# Patient Record
Sex: Female | Born: 1935
Health system: Southern US, Community
[De-identification: ages and names within clinical notes are randomized; demographics above are authoritative.]

## PROBLEM LIST (undated history)

## (undated) DIAGNOSIS — I10 Essential (primary) hypertension: Secondary | ICD-10-CM

## (undated) HISTORY — PX: ABDOMINAL HYSTERECTOMY: SHX81

## (undated) HISTORY — PX: DILATION AND CURETTAGE OF UTERUS: SHX78

## (undated) HISTORY — PX: HIP FRACTURE SURGERY: SHX118

---

## 2006-12-24 ENCOUNTER — Ambulatory Visit (HOSPITAL_COMMUNITY): Admission: RE | Admit: 2006-12-24 | Discharge: 2006-12-24 | Payer: Self-pay | Admitting: Ophthalmology

## 2013-03-26 ENCOUNTER — Encounter: Payer: Self-pay | Admitting: *Deleted

## 2013-10-28 ENCOUNTER — Encounter (HOSPITAL_COMMUNITY): Payer: Self-pay | Admitting: Pharmacy Technician

## 2013-11-10 ENCOUNTER — Ambulatory Visit (HOSPITAL_COMMUNITY)
Admission: RE | Admit: 2013-11-10 | Discharge: 2013-11-10 | Disposition: A | Discharge status: Home / Self Care | Payer: Medicare HMO | Source: Ambulatory Visit | Attending: Ophthalmology | Admitting: Ophthalmology

## 2013-11-10 ENCOUNTER — Encounter (HOSPITAL_COMMUNITY): Payer: Self-pay | Admitting: *Deleted

## 2013-11-10 ENCOUNTER — Encounter (HOSPITAL_COMMUNITY)
Admission: RE | Disposition: A | Discharge status: Home / Self Care | Payer: Self-pay | Source: Ambulatory Visit | Attending: Ophthalmology

## 2013-11-10 DIAGNOSIS — Z79899 Other long term (current) drug therapy: Secondary | ICD-10-CM | POA: Insufficient documentation

## 2013-11-10 DIAGNOSIS — I10 Essential (primary) hypertension: Secondary | ICD-10-CM | POA: Diagnosis not present

## 2013-11-10 DIAGNOSIS — H26419 Soemmering's ring, unspecified eye: Secondary | ICD-10-CM | POA: Insufficient documentation

## 2013-11-10 DIAGNOSIS — Z7982 Long term (current) use of aspirin: Secondary | ICD-10-CM | POA: Diagnosis not present

## 2013-11-10 HISTORY — PX: YAG LASER APPLICATION: SHX6189

## 2013-11-10 SURGERY — TREATMENT, USING YAG LASER
Anesthesia: LOCAL | Laterality: Left

## 2013-11-10 MED ORDER — TETRACAINE HCL 0.5 % OP SOLN
1.0000 [drp] | Freq: Once | OPHTHALMIC | Status: AC
  Administered 2013-11-10: 1 [drp] via OPHTHALMIC

## 2013-11-10 MED ORDER — TROPICAMIDE 1 % OP SOLN
1.0000 [drp] | OPHTHALMIC | Status: AC
  Administered 2013-11-10 (×3): 1 [drp] via OPHTHALMIC

## 2013-11-10 MED ORDER — APRACLONIDINE HCL 1 % OP SOLN
OPHTHALMIC | Status: AC
  Filled 2013-11-10: qty 0.1

## 2013-11-10 MED ORDER — APRACLONIDINE HCL 1 % OP SOLN
1.0000 [drp] | OPHTHALMIC | Status: AC
  Administered 2013-11-10 (×3): 1 [drp] via OPHTHALMIC

## 2013-11-10 MED ORDER — TETRACAINE HCL 0.5 % OP SOLN
OPHTHALMIC | Status: AC
  Filled 2013-11-10: qty 2

## 2013-11-10 MED ORDER — TROPICAMIDE 1 % OP SOLN
OPHTHALMIC | Status: AC
  Filled 2013-11-10: qty 3

## 2013-11-10 NOTE — Brief Op Note (Signed)
Laura Cook 11/10/2013  Williams Che, MD  Yag Laser Self Test CompletedYes.  . Procedure: Posterior Capsulotomy, left eye.  Eye Protection Worn by Staff Yes.  . Laser In Use Sign on Door Yes.  .  Laser: Nd:YAG Spot Size: Fixed Burst Mode: III Power Setting: 2.0 mJ/burst Position treated: posterior capsule Number of shots: 30 Total energy delivered: 60.2 mJ  The patient tolerated the procedure without difficulty. No complications were encountered.   The patient was discharged home immediately after procedure:  Patient verbalizes understanding of discharge instructions Yes.  .   Notes:heavy Elschnig's pearls noted

## 2013-11-10 NOTE — Discharge Instructions (Signed)
Laura Cook  11/10/2013     Instructions    Activity: No Restrictions.   Diet: Resume Diet you were on at home.   Pain Medication: Tylenol if Needed.   CONTACT YOUR DOCTOR IF YOU HAVE PAIN, REDNESS IN YOUR EYE, OR DECREASED VISION.   Follow-up: 12/02/2013 at 11:15 with Williams Che, MD.     Dr. Iona Hansen: [phone]   If you find that you cannot contact your physician, but feel that your signs and   Symptoms warrant a physician's attention, call the Emergency Room at   [phone] ext.[phone].

## 2013-11-10 NOTE — H&P (Signed)
See scanned office notes

## 2013-11-12 ENCOUNTER — Encounter (HOSPITAL_COMMUNITY): Payer: Self-pay | Admitting: Ophthalmology

## 2015-04-15 DIAGNOSIS — K219 Gastro-esophageal reflux disease without esophagitis: Secondary | ICD-10-CM | POA: Diagnosis not present

## 2015-04-15 DIAGNOSIS — G600 Hereditary motor and sensory neuropathy: Secondary | ICD-10-CM | POA: Diagnosis not present

## 2015-04-15 DIAGNOSIS — I10 Essential (primary) hypertension: Secondary | ICD-10-CM | POA: Diagnosis not present

## 2015-04-15 DIAGNOSIS — M1990 Unspecified osteoarthritis, unspecified site: Secondary | ICD-10-CM | POA: Diagnosis not present

## 2015-04-16 DIAGNOSIS — M1990 Unspecified osteoarthritis, unspecified site: Secondary | ICD-10-CM | POA: Diagnosis not present

## 2015-04-16 DIAGNOSIS — G600 Hereditary motor and sensory neuropathy: Secondary | ICD-10-CM | POA: Diagnosis not present

## 2015-04-16 DIAGNOSIS — R269 Unspecified abnormalities of gait and mobility: Secondary | ICD-10-CM | POA: Diagnosis not present

## 2015-06-03 DIAGNOSIS — W19XXXA Unspecified fall, initial encounter: Secondary | ICD-10-CM | POA: Diagnosis not present

## 2015-06-03 DIAGNOSIS — M25552 Pain in left hip: Secondary | ICD-10-CM | POA: Diagnosis not present

## 2015-06-15 DIAGNOSIS — S7002XA Contusion of left hip, initial encounter: Secondary | ICD-10-CM | POA: Diagnosis not present

## 2015-09-21 DIAGNOSIS — H538 Other visual disturbances: Secondary | ICD-10-CM | POA: Diagnosis not present

## 2015-09-21 DIAGNOSIS — Z961 Presence of intraocular lens: Secondary | ICD-10-CM | POA: Diagnosis not present

## 2015-09-21 DIAGNOSIS — H04123 Dry eye syndrome of bilateral lacrimal glands: Secondary | ICD-10-CM | POA: Diagnosis not present

## 2015-10-13 DIAGNOSIS — M1990 Unspecified osteoarthritis, unspecified site: Secondary | ICD-10-CM | POA: Diagnosis not present

## 2015-10-13 DIAGNOSIS — G600 Hereditary motor and sensory neuropathy: Secondary | ICD-10-CM | POA: Diagnosis not present

## 2015-10-13 DIAGNOSIS — I10 Essential (primary) hypertension: Secondary | ICD-10-CM | POA: Diagnosis not present

## 2015-10-13 DIAGNOSIS — K219 Gastro-esophageal reflux disease without esophagitis: Secondary | ICD-10-CM | POA: Diagnosis not present

## 2015-12-21 DIAGNOSIS — H04123 Dry eye syndrome of bilateral lacrimal glands: Secondary | ICD-10-CM | POA: Diagnosis not present

## 2016-01-24 DIAGNOSIS — J069 Acute upper respiratory infection, unspecified: Secondary | ICD-10-CM | POA: Diagnosis not present

## 2016-04-20 DIAGNOSIS — I10 Essential (primary) hypertension: Secondary | ICD-10-CM | POA: Diagnosis not present

## 2016-04-20 DIAGNOSIS — M47814 Spondylosis without myelopathy or radiculopathy, thoracic region: Secondary | ICD-10-CM | POA: Diagnosis not present

## 2016-04-20 DIAGNOSIS — Z6823 Body mass index (BMI) 23.0-23.9, adult: Secondary | ICD-10-CM | POA: Diagnosis not present

## 2016-04-20 DIAGNOSIS — K219 Gastro-esophageal reflux disease without esophagitis: Secondary | ICD-10-CM | POA: Diagnosis not present

## 2016-04-20 DIAGNOSIS — G600 Hereditary motor and sensory neuropathy: Secondary | ICD-10-CM | POA: Diagnosis not present

## 2016-10-27 DIAGNOSIS — I10 Essential (primary) hypertension: Secondary | ICD-10-CM | POA: Diagnosis not present

## 2016-10-27 DIAGNOSIS — Z6823 Body mass index (BMI) 23.0-23.9, adult: Secondary | ICD-10-CM | POA: Diagnosis not present

## 2016-10-27 DIAGNOSIS — G600 Hereditary motor and sensory neuropathy: Secondary | ICD-10-CM | POA: Diagnosis not present

## 2016-10-27 DIAGNOSIS — M47814 Spondylosis without myelopathy or radiculopathy, thoracic region: Secondary | ICD-10-CM | POA: Diagnosis not present

## 2016-10-27 DIAGNOSIS — K219 Gastro-esophageal reflux disease without esophagitis: Secondary | ICD-10-CM | POA: Diagnosis not present

## 2017-02-28 DIAGNOSIS — H524 Presbyopia: Secondary | ICD-10-CM | POA: Diagnosis not present

## 2017-02-28 DIAGNOSIS — Z0100 Encounter for examination of eyes and vision without abnormal findings: Secondary | ICD-10-CM | POA: Diagnosis not present

## 2017-04-26 DIAGNOSIS — B351 Tinea unguium: Secondary | ICD-10-CM | POA: Diagnosis not present

## 2017-04-26 DIAGNOSIS — Z6823 Body mass index (BMI) 23.0-23.9, adult: Secondary | ICD-10-CM | POA: Diagnosis not present

## 2017-04-26 DIAGNOSIS — M47814 Spondylosis without myelopathy or radiculopathy, thoracic region: Secondary | ICD-10-CM | POA: Diagnosis not present

## 2017-04-26 DIAGNOSIS — G600 Hereditary motor and sensory neuropathy: Secondary | ICD-10-CM | POA: Diagnosis not present

## 2017-04-26 DIAGNOSIS — K219 Gastro-esophageal reflux disease without esophagitis: Secondary | ICD-10-CM | POA: Diagnosis not present

## 2017-04-26 DIAGNOSIS — I10 Essential (primary) hypertension: Secondary | ICD-10-CM | POA: Diagnosis not present

## 2017-07-10 DIAGNOSIS — M549 Dorsalgia, unspecified: Secondary | ICD-10-CM | POA: Diagnosis not present

## 2017-07-10 DIAGNOSIS — M79606 Pain in leg, unspecified: Secondary | ICD-10-CM | POA: Diagnosis not present

## 2017-07-10 DIAGNOSIS — R600 Localized edema: Secondary | ICD-10-CM | POA: Diagnosis not present

## 2017-07-10 DIAGNOSIS — K219 Gastro-esophageal reflux disease without esophagitis: Secondary | ICD-10-CM | POA: Diagnosis not present

## 2017-07-10 DIAGNOSIS — I10 Essential (primary) hypertension: Secondary | ICD-10-CM | POA: Diagnosis not present

## 2017-07-10 DIAGNOSIS — R269 Unspecified abnormalities of gait and mobility: Secondary | ICD-10-CM | POA: Diagnosis not present

## 2017-07-10 DIAGNOSIS — G8929 Other chronic pain: Secondary | ICD-10-CM | POA: Diagnosis not present

## 2017-07-10 DIAGNOSIS — K5900 Constipation, unspecified: Secondary | ICD-10-CM | POA: Diagnosis not present

## 2017-07-10 DIAGNOSIS — K08109 Complete loss of teeth, unspecified cause, unspecified class: Secondary | ICD-10-CM | POA: Diagnosis not present

## 2017-07-10 DIAGNOSIS — M792 Neuralgia and neuritis, unspecified: Secondary | ICD-10-CM | POA: Diagnosis not present

## 2017-07-25 DIAGNOSIS — G600 Hereditary motor and sensory neuropathy: Secondary | ICD-10-CM | POA: Diagnosis not present

## 2017-07-25 DIAGNOSIS — I10 Essential (primary) hypertension: Secondary | ICD-10-CM | POA: Diagnosis not present

## 2017-07-25 DIAGNOSIS — Z6823 Body mass index (BMI) 23.0-23.9, adult: Secondary | ICD-10-CM | POA: Diagnosis not present

## 2017-07-25 DIAGNOSIS — K219 Gastro-esophageal reflux disease without esophagitis: Secondary | ICD-10-CM | POA: Diagnosis not present

## 2017-07-25 DIAGNOSIS — M47814 Spondylosis without myelopathy or radiculopathy, thoracic region: Secondary | ICD-10-CM | POA: Diagnosis not present

## 2017-10-23 DIAGNOSIS — G600 Hereditary motor and sensory neuropathy: Secondary | ICD-10-CM | POA: Diagnosis not present

## 2017-10-23 DIAGNOSIS — Z6823 Body mass index (BMI) 23.0-23.9, adult: Secondary | ICD-10-CM | POA: Diagnosis not present

## 2017-10-23 DIAGNOSIS — M47814 Spondylosis without myelopathy or radiculopathy, thoracic region: Secondary | ICD-10-CM | POA: Diagnosis not present

## 2017-10-23 DIAGNOSIS — I10 Essential (primary) hypertension: Secondary | ICD-10-CM | POA: Diagnosis not present

## 2017-10-23 DIAGNOSIS — K219 Gastro-esophageal reflux disease without esophagitis: Secondary | ICD-10-CM | POA: Diagnosis not present

## 2018-01-22 DIAGNOSIS — K219 Gastro-esophageal reflux disease without esophagitis: Secondary | ICD-10-CM | POA: Diagnosis not present

## 2018-01-22 DIAGNOSIS — I10 Essential (primary) hypertension: Secondary | ICD-10-CM | POA: Diagnosis not present

## 2018-01-22 DIAGNOSIS — G600 Hereditary motor and sensory neuropathy: Secondary | ICD-10-CM | POA: Diagnosis not present

## 2018-04-12 DIAGNOSIS — Z961 Presence of intraocular lens: Secondary | ICD-10-CM | POA: Diagnosis not present

## 2018-04-12 DIAGNOSIS — H524 Presbyopia: Secondary | ICD-10-CM | POA: Diagnosis not present

## 2018-04-23 DIAGNOSIS — B351 Tinea unguium: Secondary | ICD-10-CM | POA: Diagnosis not present

## 2018-04-23 DIAGNOSIS — G600 Hereditary motor and sensory neuropathy: Secondary | ICD-10-CM | POA: Diagnosis not present

## 2018-04-23 DIAGNOSIS — K219 Gastro-esophageal reflux disease without esophagitis: Secondary | ICD-10-CM | POA: Diagnosis not present

## 2018-04-23 DIAGNOSIS — M47814 Spondylosis without myelopathy or radiculopathy, thoracic region: Secondary | ICD-10-CM | POA: Diagnosis not present

## 2018-04-23 DIAGNOSIS — I10 Essential (primary) hypertension: Secondary | ICD-10-CM | POA: Diagnosis not present

## 2018-07-24 DIAGNOSIS — K219 Gastro-esophageal reflux disease without esophagitis: Secondary | ICD-10-CM | POA: Diagnosis not present

## 2018-07-24 DIAGNOSIS — G600 Hereditary motor and sensory neuropathy: Secondary | ICD-10-CM | POA: Diagnosis not present

## 2018-07-24 DIAGNOSIS — M47814 Spondylosis without myelopathy or radiculopathy, thoracic region: Secondary | ICD-10-CM | POA: Diagnosis not present

## 2018-07-24 DIAGNOSIS — I10 Essential (primary) hypertension: Secondary | ICD-10-CM | POA: Diagnosis not present

## 2018-09-09 DIAGNOSIS — Z6824 Body mass index (BMI) 24.0-24.9, adult: Secondary | ICD-10-CM | POA: Diagnosis not present

## 2018-09-09 DIAGNOSIS — R42 Dizziness and giddiness: Secondary | ICD-10-CM | POA: Diagnosis not present

## 2018-09-09 DIAGNOSIS — Z299 Encounter for prophylactic measures, unspecified: Secondary | ICD-10-CM | POA: Diagnosis not present

## 2018-09-09 DIAGNOSIS — I10 Essential (primary) hypertension: Secondary | ICD-10-CM | POA: Diagnosis not present

## 2018-09-09 DIAGNOSIS — Z789 Other specified health status: Secondary | ICD-10-CM | POA: Diagnosis not present

## 2018-11-22 DIAGNOSIS — Z79899 Other long term (current) drug therapy: Secondary | ICD-10-CM | POA: Diagnosis not present

## 2018-11-22 DIAGNOSIS — Z299 Encounter for prophylactic measures, unspecified: Secondary | ICD-10-CM | POA: Diagnosis not present

## 2018-11-22 DIAGNOSIS — Z0000 Encounter for general adult medical examination without abnormal findings: Secondary | ICD-10-CM | POA: Diagnosis not present

## 2018-11-22 DIAGNOSIS — R5383 Other fatigue: Secondary | ICD-10-CM | POA: Diagnosis not present

## 2018-11-22 DIAGNOSIS — I10 Essential (primary) hypertension: Secondary | ICD-10-CM | POA: Diagnosis not present

## 2018-11-22 DIAGNOSIS — Z1339 Encounter for screening examination for other mental health and behavioral disorders: Secondary | ICD-10-CM | POA: Diagnosis not present

## 2018-11-22 DIAGNOSIS — Z7189 Other specified counseling: Secondary | ICD-10-CM | POA: Diagnosis not present

## 2018-11-22 DIAGNOSIS — Z1331 Encounter for screening for depression: Secondary | ICD-10-CM | POA: Diagnosis not present

## 2018-11-22 DIAGNOSIS — Z6823 Body mass index (BMI) 23.0-23.9, adult: Secondary | ICD-10-CM | POA: Diagnosis not present

## 2018-12-09 DIAGNOSIS — Z299 Encounter for prophylactic measures, unspecified: Secondary | ICD-10-CM | POA: Diagnosis not present

## 2018-12-09 DIAGNOSIS — Z713 Dietary counseling and surveillance: Secondary | ICD-10-CM | POA: Diagnosis not present

## 2018-12-09 DIAGNOSIS — M25552 Pain in left hip: Secondary | ICD-10-CM | POA: Diagnosis not present

## 2018-12-09 DIAGNOSIS — Z6824 Body mass index (BMI) 24.0-24.9, adult: Secondary | ICD-10-CM | POA: Diagnosis not present

## 2018-12-09 DIAGNOSIS — I10 Essential (primary) hypertension: Secondary | ICD-10-CM | POA: Diagnosis not present

## 2018-12-11 DIAGNOSIS — E871 Hypo-osmolality and hyponatremia: Secondary | ICD-10-CM | POA: Diagnosis not present

## 2018-12-16 DIAGNOSIS — Z6823 Body mass index (BMI) 23.0-23.9, adult: Secondary | ICD-10-CM | POA: Diagnosis not present

## 2018-12-16 DIAGNOSIS — Z713 Dietary counseling and surveillance: Secondary | ICD-10-CM | POA: Diagnosis not present

## 2018-12-16 DIAGNOSIS — Z299 Encounter for prophylactic measures, unspecified: Secondary | ICD-10-CM | POA: Diagnosis not present

## 2018-12-16 DIAGNOSIS — M25552 Pain in left hip: Secondary | ICD-10-CM | POA: Diagnosis not present

## 2018-12-25 DIAGNOSIS — Z2821 Immunization not carried out because of patient refusal: Secondary | ICD-10-CM | POA: Diagnosis not present

## 2018-12-25 DIAGNOSIS — I10 Essential (primary) hypertension: Secondary | ICD-10-CM | POA: Diagnosis not present

## 2018-12-25 DIAGNOSIS — Z299 Encounter for prophylactic measures, unspecified: Secondary | ICD-10-CM | POA: Diagnosis not present

## 2018-12-25 DIAGNOSIS — M25552 Pain in left hip: Secondary | ICD-10-CM | POA: Diagnosis not present

## 2018-12-25 DIAGNOSIS — Z6823 Body mass index (BMI) 23.0-23.9, adult: Secondary | ICD-10-CM | POA: Diagnosis not present

## 2019-02-25 DIAGNOSIS — Z299 Encounter for prophylactic measures, unspecified: Secondary | ICD-10-CM | POA: Diagnosis not present

## 2019-02-25 DIAGNOSIS — I10 Essential (primary) hypertension: Secondary | ICD-10-CM | POA: Diagnosis not present

## 2019-02-25 DIAGNOSIS — Z6823 Body mass index (BMI) 23.0-23.9, adult: Secondary | ICD-10-CM | POA: Diagnosis not present

## 2019-02-25 DIAGNOSIS — Z713 Dietary counseling and surveillance: Secondary | ICD-10-CM | POA: Diagnosis not present

## 2019-02-25 DIAGNOSIS — E7800 Pure hypercholesterolemia, unspecified: Secondary | ICD-10-CM | POA: Diagnosis not present

## 2019-02-27 DIAGNOSIS — Z79899 Other long term (current) drug therapy: Secondary | ICD-10-CM | POA: Diagnosis not present

## 2019-02-27 DIAGNOSIS — E7800 Pure hypercholesterolemia, unspecified: Secondary | ICD-10-CM | POA: Diagnosis not present

## 2019-04-28 DIAGNOSIS — M79675 Pain in left toe(s): Secondary | ICD-10-CM | POA: Diagnosis not present

## 2019-04-28 DIAGNOSIS — L03032 Cellulitis of left toe: Secondary | ICD-10-CM | POA: Diagnosis not present

## 2019-04-28 DIAGNOSIS — L600 Ingrowing nail: Secondary | ICD-10-CM | POA: Diagnosis not present

## 2019-04-28 DIAGNOSIS — M79672 Pain in left foot: Secondary | ICD-10-CM | POA: Diagnosis not present

## 2019-05-12 DIAGNOSIS — M79675 Pain in left toe(s): Secondary | ICD-10-CM | POA: Diagnosis not present

## 2019-05-12 DIAGNOSIS — M79671 Pain in right foot: Secondary | ICD-10-CM | POA: Diagnosis not present

## 2019-05-12 DIAGNOSIS — M79672 Pain in left foot: Secondary | ICD-10-CM | POA: Diagnosis not present

## 2019-05-12 DIAGNOSIS — L600 Ingrowing nail: Secondary | ICD-10-CM | POA: Diagnosis not present

## 2019-05-12 DIAGNOSIS — M79674 Pain in right toe(s): Secondary | ICD-10-CM | POA: Diagnosis not present

## 2019-05-26 DIAGNOSIS — M79674 Pain in right toe(s): Secondary | ICD-10-CM | POA: Diagnosis not present

## 2019-05-26 DIAGNOSIS — M79675 Pain in left toe(s): Secondary | ICD-10-CM | POA: Diagnosis not present

## 2019-05-26 DIAGNOSIS — M79671 Pain in right foot: Secondary | ICD-10-CM | POA: Diagnosis not present

## 2019-05-26 DIAGNOSIS — I739 Peripheral vascular disease, unspecified: Secondary | ICD-10-CM | POA: Diagnosis not present

## 2019-05-26 DIAGNOSIS — L600 Ingrowing nail: Secondary | ICD-10-CM | POA: Diagnosis not present

## 2019-05-26 DIAGNOSIS — M79672 Pain in left foot: Secondary | ICD-10-CM | POA: Diagnosis not present

## 2019-05-26 DIAGNOSIS — L110 Acquired keratosis follicularis: Secondary | ICD-10-CM | POA: Diagnosis not present

## 2019-05-27 DIAGNOSIS — I10 Essential (primary) hypertension: Secondary | ICD-10-CM | POA: Diagnosis not present

## 2019-05-27 DIAGNOSIS — Z299 Encounter for prophylactic measures, unspecified: Secondary | ICD-10-CM | POA: Diagnosis not present

## 2019-05-27 DIAGNOSIS — Z789 Other specified health status: Secondary | ICD-10-CM | POA: Diagnosis not present

## 2019-05-27 DIAGNOSIS — E7800 Pure hypercholesterolemia, unspecified: Secondary | ICD-10-CM | POA: Diagnosis not present

## 2019-08-05 DIAGNOSIS — Z961 Presence of intraocular lens: Secondary | ICD-10-CM | POA: Diagnosis not present

## 2019-08-05 DIAGNOSIS — H524 Presbyopia: Secondary | ICD-10-CM | POA: Diagnosis not present

## 2019-08-05 DIAGNOSIS — Z0100 Encounter for examination of eyes and vision without abnormal findings: Secondary | ICD-10-CM | POA: Diagnosis not present

## 2019-08-05 DIAGNOSIS — H353131 Nonexudative age-related macular degeneration, bilateral, early dry stage: Secondary | ICD-10-CM | POA: Diagnosis not present

## 2019-08-11 DIAGNOSIS — Z008 Encounter for other general examination: Secondary | ICD-10-CM | POA: Diagnosis not present

## 2019-08-11 DIAGNOSIS — I739 Peripheral vascular disease, unspecified: Secondary | ICD-10-CM | POA: Diagnosis not present

## 2019-08-11 DIAGNOSIS — M79672 Pain in left foot: Secondary | ICD-10-CM | POA: Diagnosis not present

## 2019-08-11 DIAGNOSIS — E785 Hyperlipidemia, unspecified: Secondary | ICD-10-CM | POA: Diagnosis not present

## 2019-08-11 DIAGNOSIS — M792 Neuralgia and neuritis, unspecified: Secondary | ICD-10-CM | POA: Diagnosis not present

## 2019-08-11 DIAGNOSIS — M79671 Pain in right foot: Secondary | ICD-10-CM | POA: Diagnosis not present

## 2019-08-11 DIAGNOSIS — G8929 Other chronic pain: Secondary | ICD-10-CM | POA: Diagnosis not present

## 2019-08-11 DIAGNOSIS — K5900 Constipation, unspecified: Secondary | ICD-10-CM | POA: Diagnosis not present

## 2019-08-11 DIAGNOSIS — K219 Gastro-esophageal reflux disease without esophagitis: Secondary | ICD-10-CM | POA: Diagnosis not present

## 2019-08-11 DIAGNOSIS — I10 Essential (primary) hypertension: Secondary | ICD-10-CM | POA: Diagnosis not present

## 2019-08-11 DIAGNOSIS — R32 Unspecified urinary incontinence: Secondary | ICD-10-CM | POA: Diagnosis not present

## 2019-08-11 DIAGNOSIS — L110 Acquired keratosis follicularis: Secondary | ICD-10-CM | POA: Diagnosis not present

## 2019-08-11 DIAGNOSIS — R269 Unspecified abnormalities of gait and mobility: Secondary | ICD-10-CM | POA: Diagnosis not present

## 2019-08-11 DIAGNOSIS — M810 Age-related osteoporosis without current pathological fracture: Secondary | ICD-10-CM | POA: Diagnosis not present

## 2019-09-02 DIAGNOSIS — Z789 Other specified health status: Secondary | ICD-10-CM | POA: Diagnosis not present

## 2019-09-02 DIAGNOSIS — Z299 Encounter for prophylactic measures, unspecified: Secondary | ICD-10-CM | POA: Diagnosis not present

## 2019-09-02 DIAGNOSIS — R42 Dizziness and giddiness: Secondary | ICD-10-CM | POA: Diagnosis not present

## 2019-09-02 DIAGNOSIS — I10 Essential (primary) hypertension: Secondary | ICD-10-CM | POA: Diagnosis not present

## 2019-09-29 DIAGNOSIS — H353133 Nonexudative age-related macular degeneration, bilateral, advanced atrophic without subfoveal involvement: Secondary | ICD-10-CM | POA: Diagnosis not present

## 2019-10-21 DIAGNOSIS — M79674 Pain in right toe(s): Secondary | ICD-10-CM | POA: Diagnosis not present

## 2019-10-21 DIAGNOSIS — M79675 Pain in left toe(s): Secondary | ICD-10-CM | POA: Diagnosis not present

## 2019-10-21 DIAGNOSIS — I739 Peripheral vascular disease, unspecified: Secondary | ICD-10-CM | POA: Diagnosis not present

## 2019-10-21 DIAGNOSIS — M79671 Pain in right foot: Secondary | ICD-10-CM | POA: Diagnosis not present

## 2019-10-21 DIAGNOSIS — M79672 Pain in left foot: Secondary | ICD-10-CM | POA: Diagnosis not present

## 2019-10-21 DIAGNOSIS — L110 Acquired keratosis follicularis: Secondary | ICD-10-CM | POA: Diagnosis not present

## 2019-11-24 DIAGNOSIS — Z79899 Other long term (current) drug therapy: Secondary | ICD-10-CM | POA: Diagnosis not present

## 2019-11-24 DIAGNOSIS — E2839 Other primary ovarian failure: Secondary | ICD-10-CM | POA: Diagnosis not present

## 2019-11-24 DIAGNOSIS — Z7189 Other specified counseling: Secondary | ICD-10-CM | POA: Diagnosis not present

## 2019-11-24 DIAGNOSIS — Z0000 Encounter for general adult medical examination without abnormal findings: Secondary | ICD-10-CM | POA: Diagnosis not present

## 2019-11-24 DIAGNOSIS — Z6821 Body mass index (BMI) 21.0-21.9, adult: Secondary | ICD-10-CM | POA: Diagnosis not present

## 2019-11-24 DIAGNOSIS — I10 Essential (primary) hypertension: Secondary | ICD-10-CM | POA: Diagnosis not present

## 2019-11-24 DIAGNOSIS — Z299 Encounter for prophylactic measures, unspecified: Secondary | ICD-10-CM | POA: Diagnosis not present

## 2019-11-24 DIAGNOSIS — R5383 Other fatigue: Secondary | ICD-10-CM | POA: Diagnosis not present

## 2019-11-24 DIAGNOSIS — E7800 Pure hypercholesterolemia, unspecified: Secondary | ICD-10-CM | POA: Diagnosis not present

## 2019-11-24 DIAGNOSIS — Z1339 Encounter for screening examination for other mental health and behavioral disorders: Secondary | ICD-10-CM | POA: Diagnosis not present

## 2019-11-24 DIAGNOSIS — Z1331 Encounter for screening for depression: Secondary | ICD-10-CM | POA: Diagnosis not present

## 2020-01-20 DIAGNOSIS — M79672 Pain in left foot: Secondary | ICD-10-CM | POA: Diagnosis not present

## 2020-01-20 DIAGNOSIS — L110 Acquired keratosis follicularis: Secondary | ICD-10-CM | POA: Diagnosis not present

## 2020-01-20 DIAGNOSIS — I739 Peripheral vascular disease, unspecified: Secondary | ICD-10-CM | POA: Diagnosis not present

## 2020-01-20 DIAGNOSIS — M79674 Pain in right toe(s): Secondary | ICD-10-CM | POA: Diagnosis not present

## 2020-01-20 DIAGNOSIS — M79675 Pain in left toe(s): Secondary | ICD-10-CM | POA: Diagnosis not present

## 2020-01-20 DIAGNOSIS — M79671 Pain in right foot: Secondary | ICD-10-CM | POA: Diagnosis not present

## 2020-03-02 DIAGNOSIS — M1990 Unspecified osteoarthritis, unspecified site: Secondary | ICD-10-CM | POA: Diagnosis not present

## 2020-03-02 DIAGNOSIS — Z6821 Body mass index (BMI) 21.0-21.9, adult: Secondary | ICD-10-CM | POA: Diagnosis not present

## 2020-03-02 DIAGNOSIS — R011 Cardiac murmur, unspecified: Secondary | ICD-10-CM | POA: Diagnosis not present

## 2020-03-02 DIAGNOSIS — I10 Essential (primary) hypertension: Secondary | ICD-10-CM | POA: Diagnosis not present

## 2020-03-02 DIAGNOSIS — Z789 Other specified health status: Secondary | ICD-10-CM | POA: Diagnosis not present

## 2020-03-02 DIAGNOSIS — Z299 Encounter for prophylactic measures, unspecified: Secondary | ICD-10-CM | POA: Diagnosis not present

## 2020-03-02 DIAGNOSIS — R42 Dizziness and giddiness: Secondary | ICD-10-CM | POA: Diagnosis not present

## 2020-03-27 DIAGNOSIS — K219 Gastro-esophageal reflux disease without esophagitis: Secondary | ICD-10-CM | POA: Diagnosis not present

## 2020-03-27 DIAGNOSIS — M2550 Pain in unspecified joint: Secondary | ICD-10-CM | POA: Diagnosis not present

## 2020-03-27 DIAGNOSIS — E785 Hyperlipidemia, unspecified: Secondary | ICD-10-CM | POA: Diagnosis not present

## 2020-03-27 DIAGNOSIS — R42 Dizziness and giddiness: Secondary | ICD-10-CM | POA: Diagnosis not present

## 2020-03-27 DIAGNOSIS — K5900 Constipation, unspecified: Secondary | ICD-10-CM | POA: Diagnosis not present

## 2020-03-27 DIAGNOSIS — Z791 Long term (current) use of non-steroidal anti-inflammatories (NSAID): Secondary | ICD-10-CM | POA: Diagnosis not present

## 2020-03-27 DIAGNOSIS — G8929 Other chronic pain: Secondary | ICD-10-CM | POA: Diagnosis not present

## 2020-03-27 DIAGNOSIS — Z7722 Contact with and (suspected) exposure to environmental tobacco smoke (acute) (chronic): Secondary | ICD-10-CM | POA: Diagnosis not present

## 2020-03-27 DIAGNOSIS — R32 Unspecified urinary incontinence: Secondary | ICD-10-CM | POA: Diagnosis not present

## 2020-03-27 DIAGNOSIS — I10 Essential (primary) hypertension: Secondary | ICD-10-CM | POA: Diagnosis not present

## 2020-04-06 DIAGNOSIS — I739 Peripheral vascular disease, unspecified: Secondary | ICD-10-CM | POA: Diagnosis not present

## 2020-04-06 DIAGNOSIS — M79672 Pain in left foot: Secondary | ICD-10-CM | POA: Diagnosis not present

## 2020-04-06 DIAGNOSIS — M79671 Pain in right foot: Secondary | ICD-10-CM | POA: Diagnosis not present

## 2020-04-06 DIAGNOSIS — L110 Acquired keratosis follicularis: Secondary | ICD-10-CM | POA: Diagnosis not present

## 2020-04-06 DIAGNOSIS — M79674 Pain in right toe(s): Secondary | ICD-10-CM | POA: Diagnosis not present

## 2020-04-06 DIAGNOSIS — M79675 Pain in left toe(s): Secondary | ICD-10-CM | POA: Diagnosis not present

## 2020-06-01 DIAGNOSIS — M7552 Bursitis of left shoulder: Secondary | ICD-10-CM | POA: Diagnosis not present

## 2020-06-01 DIAGNOSIS — Z6822 Body mass index (BMI) 22.0-22.9, adult: Secondary | ICD-10-CM | POA: Diagnosis not present

## 2020-06-01 DIAGNOSIS — Z299 Encounter for prophylactic measures, unspecified: Secondary | ICD-10-CM | POA: Diagnosis not present

## 2020-06-01 DIAGNOSIS — M1990 Unspecified osteoarthritis, unspecified site: Secondary | ICD-10-CM | POA: Diagnosis not present

## 2020-06-01 DIAGNOSIS — I10 Essential (primary) hypertension: Secondary | ICD-10-CM | POA: Diagnosis not present

## 2020-07-01 DIAGNOSIS — R42 Dizziness and giddiness: Secondary | ICD-10-CM | POA: Diagnosis not present

## 2020-07-01 DIAGNOSIS — Z299 Encounter for prophylactic measures, unspecified: Secondary | ICD-10-CM | POA: Diagnosis not present

## 2020-07-01 DIAGNOSIS — I10 Essential (primary) hypertension: Secondary | ICD-10-CM | POA: Diagnosis not present

## 2020-07-06 DIAGNOSIS — L110 Acquired keratosis follicularis: Secondary | ICD-10-CM | POA: Diagnosis not present

## 2020-07-06 DIAGNOSIS — M79671 Pain in right foot: Secondary | ICD-10-CM | POA: Diagnosis not present

## 2020-07-06 DIAGNOSIS — I739 Peripheral vascular disease, unspecified: Secondary | ICD-10-CM | POA: Diagnosis not present

## 2020-07-06 DIAGNOSIS — M79672 Pain in left foot: Secondary | ICD-10-CM | POA: Diagnosis not present

## 2020-07-06 DIAGNOSIS — M79675 Pain in left toe(s): Secondary | ICD-10-CM | POA: Diagnosis not present

## 2020-07-06 DIAGNOSIS — M79674 Pain in right toe(s): Secondary | ICD-10-CM | POA: Diagnosis not present

## 2020-07-12 DIAGNOSIS — E2839 Other primary ovarian failure: Secondary | ICD-10-CM | POA: Diagnosis not present

## 2020-08-12 DIAGNOSIS — H524 Presbyopia: Secondary | ICD-10-CM | POA: Diagnosis not present

## 2020-08-12 DIAGNOSIS — Z961 Presence of intraocular lens: Secondary | ICD-10-CM | POA: Diagnosis not present

## 2020-08-12 DIAGNOSIS — H353133 Nonexudative age-related macular degeneration, bilateral, advanced atrophic without subfoveal involvement: Secondary | ICD-10-CM | POA: Diagnosis not present

## 2020-09-28 DIAGNOSIS — I739 Peripheral vascular disease, unspecified: Secondary | ICD-10-CM | POA: Diagnosis not present

## 2020-09-28 DIAGNOSIS — M79674 Pain in right toe(s): Secondary | ICD-10-CM | POA: Diagnosis not present

## 2020-09-28 DIAGNOSIS — M79671 Pain in right foot: Secondary | ICD-10-CM | POA: Diagnosis not present

## 2020-09-28 DIAGNOSIS — M79675 Pain in left toe(s): Secondary | ICD-10-CM | POA: Diagnosis not present

## 2020-09-28 DIAGNOSIS — L110 Acquired keratosis follicularis: Secondary | ICD-10-CM | POA: Diagnosis not present

## 2020-09-28 DIAGNOSIS — M79672 Pain in left foot: Secondary | ICD-10-CM | POA: Diagnosis not present

## 2020-11-14 ENCOUNTER — Other Ambulatory Visit: Payer: Self-pay

## 2020-11-14 ENCOUNTER — Emergency Department (HOSPITAL_COMMUNITY): Payer: Medicare HMO

## 2020-11-14 ENCOUNTER — Inpatient Hospital Stay (HOSPITAL_COMMUNITY)
Admission: EM | Admit: 2020-11-14 | Discharge: 2020-11-19 | DRG: 480 | Disposition: A | Discharge status: Skilled Nursing Facility | Payer: Medicare HMO | Attending: Internal Medicine | Admitting: Internal Medicine

## 2020-11-14 ENCOUNTER — Encounter (HOSPITAL_COMMUNITY): Payer: Self-pay | Admitting: Family Medicine

## 2020-11-14 DIAGNOSIS — E871 Hypo-osmolality and hyponatremia: Secondary | ICD-10-CM | POA: Diagnosis not present

## 2020-11-14 DIAGNOSIS — Z20822 Contact with and (suspected) exposure to covid-19: Secondary | ICD-10-CM | POA: Diagnosis not present

## 2020-11-14 DIAGNOSIS — E785 Hyperlipidemia, unspecified: Secondary | ICD-10-CM

## 2020-11-14 DIAGNOSIS — E876 Hypokalemia: Secondary | ICD-10-CM | POA: Diagnosis present

## 2020-11-14 DIAGNOSIS — Y92000 Kitchen of unspecified non-institutional (private) residence as  the place of occurrence of the external cause: Secondary | ICD-10-CM | POA: Diagnosis not present

## 2020-11-14 DIAGNOSIS — W19XXXA Unspecified fall, initial encounter: Secondary | ICD-10-CM

## 2020-11-14 DIAGNOSIS — S72141A Displaced intertrochanteric fracture of right femur, initial encounter for closed fracture: Principal | ICD-10-CM | POA: Diagnosis present

## 2020-11-14 DIAGNOSIS — R2689 Other abnormalities of gait and mobility: Secondary | ICD-10-CM | POA: Diagnosis present

## 2020-11-14 DIAGNOSIS — Y92009 Unspecified place in unspecified non-institutional (private) residence as the place of occurrence of the external cause: Secondary | ICD-10-CM | POA: Diagnosis not present

## 2020-11-14 DIAGNOSIS — S72001A Fracture of unspecified part of neck of right femur, initial encounter for closed fracture: Secondary | ICD-10-CM | POA: Diagnosis not present

## 2020-11-14 DIAGNOSIS — G928 Other toxic encephalopathy: Secondary | ICD-10-CM | POA: Diagnosis not present

## 2020-11-14 DIAGNOSIS — K219 Gastro-esophageal reflux disease without esophagitis: Secondary | ICD-10-CM

## 2020-11-14 DIAGNOSIS — J398 Other specified diseases of upper respiratory tract: Secondary | ICD-10-CM | POA: Diagnosis not present

## 2020-11-14 DIAGNOSIS — Z791 Long term (current) use of non-steroidal anti-inflammatories (NSAID): Secondary | ICD-10-CM

## 2020-11-14 DIAGNOSIS — I878 Other specified disorders of veins: Secondary | ICD-10-CM | POA: Diagnosis not present

## 2020-11-14 DIAGNOSIS — T502X5A Adverse effect of carbonic-anhydrase inhibitors, benzothiadiazides and other diuretics, initial encounter: Secondary | ICD-10-CM | POA: Diagnosis present

## 2020-11-14 DIAGNOSIS — W1830XA Fall on same level, unspecified, initial encounter: Secondary | ICD-10-CM | POA: Diagnosis not present

## 2020-11-14 DIAGNOSIS — R918 Other nonspecific abnormal finding of lung field: Secondary | ICD-10-CM | POA: Diagnosis not present

## 2020-11-14 DIAGNOSIS — R52 Pain, unspecified: Secondary | ICD-10-CM | POA: Diagnosis not present

## 2020-11-14 DIAGNOSIS — Y92239 Unspecified place in hospital as the place of occurrence of the external cause: Secondary | ICD-10-CM | POA: Diagnosis not present

## 2020-11-14 DIAGNOSIS — M1611 Unilateral primary osteoarthritis, right hip: Secondary | ICD-10-CM | POA: Diagnosis present

## 2020-11-14 DIAGNOSIS — I10 Essential (primary) hypertension: Secondary | ICD-10-CM

## 2020-11-14 DIAGNOSIS — T40605A Adverse effect of unspecified narcotics, initial encounter: Secondary | ICD-10-CM | POA: Diagnosis not present

## 2020-11-14 DIAGNOSIS — Z79899 Other long term (current) drug therapy: Secondary | ICD-10-CM

## 2020-11-14 DIAGNOSIS — Z7982 Long term (current) use of aspirin: Secondary | ICD-10-CM | POA: Diagnosis not present

## 2020-11-14 DIAGNOSIS — D649 Anemia, unspecified: Secondary | ICD-10-CM | POA: Diagnosis not present

## 2020-11-14 DIAGNOSIS — D62 Acute posthemorrhagic anemia: Secondary | ICD-10-CM | POA: Diagnosis not present

## 2020-11-14 DIAGNOSIS — S2242XA Multiple fractures of ribs, left side, initial encounter for closed fracture: Secondary | ICD-10-CM | POA: Diagnosis present

## 2020-11-14 DIAGNOSIS — R0902 Hypoxemia: Secondary | ICD-10-CM | POA: Diagnosis not present

## 2020-11-14 DIAGNOSIS — Z9889 Other specified postprocedural states: Secondary | ICD-10-CM | POA: Diagnosis not present

## 2020-11-14 DIAGNOSIS — M47816 Spondylosis without myelopathy or radiculopathy, lumbar region: Secondary | ICD-10-CM | POA: Diagnosis not present

## 2020-11-14 DIAGNOSIS — M25551 Pain in right hip: Secondary | ICD-10-CM | POA: Diagnosis not present

## 2020-11-14 DIAGNOSIS — I517 Cardiomegaly: Secondary | ICD-10-CM | POA: Diagnosis not present

## 2020-11-14 DIAGNOSIS — E7800 Pure hypercholesterolemia, unspecified: Secondary | ICD-10-CM | POA: Diagnosis not present

## 2020-11-14 DIAGNOSIS — Z8249 Family history of ischemic heart disease and other diseases of the circulatory system: Secondary | ICD-10-CM

## 2020-11-14 DIAGNOSIS — S72009A Fracture of unspecified part of neck of unspecified femur, initial encounter for closed fracture: Secondary | ICD-10-CM | POA: Diagnosis present

## 2020-11-14 DIAGNOSIS — Z743 Need for continuous supervision: Secondary | ICD-10-CM | POA: Diagnosis not present

## 2020-11-14 HISTORY — DX: Essential (primary) hypertension: I10

## 2020-11-14 LAB — CBC WITH DIFFERENTIAL/PLATELET
Abs Immature Granulocytes: 0.03 10*3/uL (ref 0.00–0.07)
Basophils Absolute: 0.1 10*3/uL (ref 0.0–0.1)
Basophils Relative: 1 %
Eosinophils Absolute: 0.1 10*3/uL (ref 0.0–0.5)
Eosinophils Relative: 2 %
HCT: 33.5 % — ABNORMAL LOW (ref 36.0–46.0)
Hemoglobin: 11.3 g/dL — ABNORMAL LOW (ref 12.0–15.0)
Immature Granulocytes: 1 %
Lymphocytes Relative: 20 %
Lymphs Abs: 1.2 10*3/uL (ref 0.7–4.0)
MCH: 29.4 pg (ref 26.0–34.0)
MCHC: 33.7 g/dL (ref 30.0–36.0)
MCV: 87 fL (ref 80.0–100.0)
Monocytes Absolute: 0.5 10*3/uL (ref 0.1–1.0)
Monocytes Relative: 8 %
Neutro Abs: 4.1 10*3/uL (ref 1.7–7.7)
Neutrophils Relative %: 68 %
Platelets: 325 10*3/uL (ref 150–400)
RBC: 3.85 MIL/uL — ABNORMAL LOW (ref 3.87–5.11)
RDW: 13.3 % (ref 11.5–15.5)
WBC: 5.9 10*3/uL (ref 4.0–10.5)
nRBC: 0 % (ref 0.0–0.2)

## 2020-11-14 LAB — COMPREHENSIVE METABOLIC PANEL
ALT: 9 U/L (ref 0–44)
AST: 13 U/L — ABNORMAL LOW (ref 15–41)
Albumin: 4 g/dL (ref 3.5–5.0)
Alkaline Phosphatase: 51 U/L (ref 38–126)
Anion gap: 9 (ref 5–15)
BUN: 16 mg/dL (ref 8–23)
CO2: 27 mmol/L (ref 22–32)
Calcium: 9.1 mg/dL (ref 8.9–10.3)
Chloride: 91 mmol/L — ABNORMAL LOW (ref 98–111)
Creatinine, Ser: 0.51 mg/dL (ref 0.44–1.00)
GFR, Estimated: >60 mL/min (ref >60)
Glucose, Bld: 116 mg/dL — ABNORMAL HIGH (ref 70–99)
Potassium: 3.2 mmol/L — ABNORMAL LOW (ref 3.5–5.1)
Sodium: 127 mmol/L — ABNORMAL LOW (ref 135–145)
Total Bilirubin: 0.8 mg/dL (ref 0.3–1.2)
Total Protein: 6.4 g/dL — ABNORMAL LOW (ref 6.5–8.1)

## 2020-11-14 LAB — MAGNESIUM: Magnesium: 1.8 mg/dL (ref 1.7–2.4)

## 2020-11-14 LAB — RESP PANEL BY RT-PCR (FLU A&B, COVID) ARPGX2
Influenza A by PCR: NEGATIVE
Influenza B by PCR: NEGATIVE
SARS Coronavirus 2 by RT PCR: NEGATIVE

## 2020-11-14 LAB — GLUCOSE, CAPILLARY: Glucose-Capillary: 165 mg/dL — ABNORMAL HIGH (ref 70–99)

## 2020-11-14 MED ORDER — SODIUM CHLORIDE 0.9% FLUSH
3.0000 mL | Freq: Two times a day (BID) | INTRAVENOUS | Status: DC
  Administered 2020-11-15 – 2020-11-18 (×7): 3 mL via INTRAVENOUS

## 2020-11-14 MED ORDER — MECLIZINE HCL 12.5 MG PO TABS: 25.0000 mg | ORAL_TABLET | Freq: Three times a day (TID) | ORAL | Status: DC | PRN

## 2020-11-14 MED ORDER — BISACODYL 5 MG PO TBEC: 5.0000 mg | DELAYED_RELEASE_TABLET | Freq: Every day | ORAL | Status: DC | PRN

## 2020-11-14 MED ORDER — ACETAMINOPHEN 650 MG RE SUPP: 650.0000 mg | Freq: Four times a day (QID) | RECTAL | Status: DC | PRN

## 2020-11-14 MED ORDER — ACETAMINOPHEN 325 MG PO TABS
650.0000 mg | ORAL_TABLET | Freq: Four times a day (QID) | ORAL | Status: DC | PRN
  Administered 2020-11-16: 650 mg via ORAL
  Filled 2020-11-14: qty 2

## 2020-11-14 MED ORDER — POLYETHYLENE GLYCOL 3350 17 G PO PACK: 17.0000 g | PACK | Freq: Every day | ORAL | Status: DC | PRN

## 2020-11-14 MED ORDER — METHOCARBAMOL 500 MG PO TABS
500.0000 mg | ORAL_TABLET | Freq: Three times a day (TID) | ORAL | Status: DC
  Administered 2020-11-14 – 2020-11-19 (×12): 500 mg via ORAL
  Filled 2020-11-14 (×15): qty 1

## 2020-11-14 MED ORDER — AMLODIPINE BESYLATE 5 MG PO TABS
5.0000 mg | ORAL_TABLET | Freq: Every day | ORAL | Status: DC
  Administered 2020-11-14 – 2020-11-19 (×5): 5 mg via ORAL
  Filled 2020-11-14 (×6): qty 1

## 2020-11-14 MED ORDER — FENTANYL CITRATE PF 50 MCG/ML IJ SOSY
50.0000 ug | PREFILLED_SYRINGE | INTRAMUSCULAR | Status: DC | PRN
  Administered 2020-11-14: 50 ug via INTRAVENOUS
  Filled 2020-11-14 (×2): qty 1

## 2020-11-14 MED ORDER — POTASSIUM CHLORIDE 10 MEQ/100ML IV SOLN
10.0000 meq | INTRAVENOUS | Status: AC
  Administered 2020-11-14 (×4): 10 meq via INTRAVENOUS
  Filled 2020-11-14 (×4): qty 100

## 2020-11-14 MED ORDER — TRANEXAMIC ACID-NACL 1000-0.7 MG/100ML-% IV SOLN
1000.0000 mg | INTRAVENOUS | Status: AC
  Administered 2020-11-15: 1000 mg via INTRAVENOUS
  Filled 2020-11-14: qty 100

## 2020-11-14 MED ORDER — METOPROLOL TARTRATE 50 MG PO TABS
100.0000 mg | ORAL_TABLET | Freq: Every day | ORAL | Status: DC
  Administered 2020-11-14 – 2020-11-19 (×5): 100 mg via ORAL
  Filled 2020-11-14 (×6): qty 2

## 2020-11-14 MED ORDER — CYCLOSPORINE 0.05 % OP EMUL
1.0000 [drp] | Freq: Two times a day (BID) | OPHTHALMIC | Status: DC
  Administered 2020-11-14 – 2020-11-19 (×9): 1 [drp] via OPHTHALMIC
  Filled 2020-11-14 (×10): qty 30

## 2020-11-14 MED ORDER — LABETALOL HCL 5 MG/ML IV SOLN: 10.0000 mg | INTRAVENOUS | Status: DC | PRN

## 2020-11-14 MED ORDER — POTASSIUM CHLORIDE 10 MEQ/100ML IV SOLN: 10.0000 meq | INTRAVENOUS | Status: DC

## 2020-11-14 MED ORDER — ENOXAPARIN SODIUM 40 MG/0.4ML IJ SOSY
40.0000 mg | PREFILLED_SYRINGE | INTRAMUSCULAR | Status: DC
  Administered 2020-11-15 – 2020-11-18 (×4): 40 mg via SUBCUTANEOUS
  Filled 2020-11-14 (×4): qty 0.4

## 2020-11-14 MED ORDER — SODIUM CHLORIDE 0.9 % IV SOLN: INTRAVENOUS | Status: DC

## 2020-11-14 MED ORDER — FENTANYL CITRATE PF 50 MCG/ML IJ SOSY
25.0000 ug | PREFILLED_SYRINGE | INTRAMUSCULAR | Status: DC | PRN
  Administered 2020-11-14 (×3): 25 ug via INTRAVENOUS
  Filled 2020-11-14 (×2): qty 1

## 2020-11-14 MED ORDER — SODIUM CHLORIDE 0.9 % IV SOLN: 250.0000 mL | INTRAVENOUS | Status: DC | PRN

## 2020-11-14 MED ORDER — OXYCODONE HCL 5 MG PO TABS
5.0000 mg | ORAL_TABLET | ORAL | Status: DC | PRN
  Administered 2020-11-14 – 2020-11-16 (×4): 5 mg via ORAL
  Filled 2020-11-14 (×4): qty 1

## 2020-11-14 MED ORDER — CEFAZOLIN SODIUM-DEXTROSE 2-4 GM/100ML-% IV SOLN
2.0000 g | INTRAVENOUS | Status: AC
  Administered 2020-11-15: 2 g via INTRAVENOUS
  Filled 2020-11-14: qty 100

## 2020-11-14 MED ORDER — PANTOPRAZOLE SODIUM 40 MG PO TBEC
40.0000 mg | DELAYED_RELEASE_TABLET | Freq: Every day | ORAL | Status: DC
  Administered 2020-11-14 – 2020-11-19 (×5): 40 mg via ORAL
  Filled 2020-11-14 (×6): qty 1

## 2020-11-14 MED ORDER — TRAZODONE HCL 50 MG PO TABS
50.0000 mg | ORAL_TABLET | Freq: Every evening | ORAL | Status: DC | PRN
  Administered 2020-11-15 – 2020-11-16 (×2): 50 mg via ORAL
  Filled 2020-11-14 (×2): qty 1

## 2020-11-14 MED ORDER — SODIUM CHLORIDE 0.9% FLUSH: 3.0000 mL | INTRAVENOUS | Status: DC | PRN

## 2020-11-14 MED ORDER — ONDANSETRON HCL 4 MG/2ML IJ SOLN
4.0000 mg | Freq: Four times a day (QID) | INTRAMUSCULAR | Status: DC | PRN
  Administered 2020-11-14: 4 mg via INTRAVENOUS
  Filled 2020-11-14 (×2): qty 2

## 2020-11-14 MED ORDER — POTASSIUM CHLORIDE CRYS ER 20 MEQ PO TBCR
40.0000 meq | EXTENDED_RELEASE_TABLET | Freq: Once | ORAL | Status: AC
  Administered 2020-11-14: 40 meq via ORAL
  Filled 2020-11-14: qty 2

## 2020-11-14 MED ORDER — ONDANSETRON HCL 4 MG PO TABS: 4.0000 mg | ORAL_TABLET | Freq: Four times a day (QID) | ORAL | Status: DC | PRN

## 2020-11-14 MED ORDER — BISACODYL 10 MG RE SUPP: 10.0000 mg | Freq: Every day | RECTAL | Status: DC | PRN

## 2020-11-14 MED ORDER — SODIUM CHLORIDE 0.9% FLUSH
3.0000 mL | Freq: Two times a day (BID) | INTRAVENOUS | Status: DC
  Administered 2020-11-18: 3 mL via INTRAVENOUS

## 2020-11-14 MED ORDER — POLYETHYLENE GLYCOL 3350 17 G PO PACK
17.0000 g | PACK | Freq: Every day | ORAL | Status: DC
  Administered 2020-11-14 – 2020-11-19 (×4): 17 g via ORAL
  Filled 2020-11-14 (×6): qty 1

## 2020-11-14 NOTE — ED Triage Notes (Signed)
Pt states she did not lose consciousness, but does not know if she hit her head or not

## 2020-11-14 NOTE — H&P (Addendum)
Patient Demographics:    Laura Cook, is a 87 y.o. female  MRN: [id number]   DOB - 05-16-1922  Admit Date - 11/14/2020  Outpatient Primary MD for the patient is Monico Blitz, MD   Assessment & Plan:    Principal Problem:   Rt Hip fracture /S/p Fall Active Problems:   HTN (hypertension)   Hyponatremia--HCTZ induced   GERD (gastroesophageal reflux disease)   HLD (hyperlipidemia)  1)Rt Hip Fx--status post mechanical fall with right hip fracture -Orthopedic consult appreciated--plan is for ORIF on 11/15/2020 -Further management per orthopedic team -Pain control and muscle relaxants as ordered -Preop EKG with sinus rhythm with QTC 473  2)Hyponatremia/hypokalemia--- due to HCTZ use -Replace potassium, hydrate with IV fluids and stop HCTZ  3)HTN--stable, continue amlodipine, hold HCTZ due to #2 above  may use IV labetalol when necessary  Every 4 hours for systolic blood pressure over 170 mmhg  4)HLD--hold Crestor, given age of almost 79 years--doubt significant benefit down the road from statin therapy  5)GERD--Protonix as ordered  6) abnormal chest x-ray-----CXR with possible Lt sided rib fractures---may need CT chest to evaluate mild widening of the RIGHT paratracheal stripe  Disposition/Need for in-Hospital Stay- patient unable to be discharged at this time due to -right hip fracture requiring ORIF as well as hyponatremia-hypokalemia requiring correction and IV fluids*  Status is: Inpatient  Remains inpatient appropriate because: see Disposition  Dispo: The patient is from: Home              Anticipated d/c is to: SNF              Anticipated d/c date is: 2 days              Patient currently is not medically stable to d/c. Barriers: Not Clinically Stable-    With History of - Reviewed by  me  History reviewed. No pertinent past medical history.    Past Surgical History:  Procedure Laterality Date   YAG LASER APPLICATION Left 9/83/3825   Procedure: YAG LASER APPLICATION;  Surgeon: Williams Che, MD;  Location: [facility];  Service: Ophthalmology;  Laterality: Left;    CC--- Rt Hip pain---   HPI:    Laura Cook  is a 87 y.o. female (almost 87 yo) with Pmhx relevant for HTN, HLD, GERD who presents with Rt Hip Pain after falling at home in her kitchen ---She states she was bringing milk to the table while holding onto things, when she fell.  She was Not actively using her walker when she fell.  She has a chronic gait instability.  No fever  Or chills , No Nausea, Vomiting or Diarrhea -No chest pain, no HAs--, no visual disturbances,  - CXR with possible Lt sided rib fractures---may need CT chest to evaluate mild widening of the RIGHT paratracheal stripe - Mag 1.8 -Hgb 11.3,  WBC 5.9 -Na 127, K-3.2, chloride 91, creatinine 0.51 Hip Xrays ---shows Comminuted intertrochanteric  fracture of the RIGHT proximal femur.   Review of systems:    In addition to the HPI above,   A full Review of  Systems was done, all other systems reviewed are negative except as noted above in HPI , .   Social History:  Reviewed by me    Social History   Tobacco Use   Smoking status: Unknown   Smokeless tobacco: Not on file  Substance Use Topics   Alcohol use: Not on file     Family History :  Reviewed by me   HTN    Home Medications:   Prior to Admission medications   Medication Sig Start Date End Date Taking? Authorizing Provider  acetaminophen (TYLENOL) 325 MG tablet Take 650 mg by mouth every 6 (six) hours as needed.   Yes [provider]  amLODipine (NORVASC) 5 MG tablet Take 5 mg by mouth daily. 11/11/20  Yes [provider]  bisacodyl (DULCOLAX) 5 MG EC tablet Take 5 mg by mouth daily as needed for moderate constipation.   Yes [provider]   esomeprazole (NEXIUM) 20 MG capsule Take 20 mg by mouth daily at 12 noon.   Yes [provider]  hydrochlorothiazide (HYDRODIURIL) 50 MG tablet Take 50 mg by mouth daily.   Yes [provider]  meclizine (ANTIVERT) 25 MG tablet Take 25 mg by mouth every 8 (eight) hours as needed. 07/01/20  Yes [provider]  meloxicam (MOBIC) 15 MG tablet Take 15 mg by mouth daily. 09/07/20  Yes [provider]  metoprolol tartrate (LOPRESSOR) 100 MG tablet Take 100 mg by mouth daily. 10/25/20  Yes [provider]  OVER THE COUNTER MEDICATION Take 1 tablet by mouth daily. calcium   Yes [provider]  RESTASIS 0.05 % ophthalmic emulsion 1 drop 2 (two) times daily. 11/08/20  Yes [provider]  rosuvastatin (CRESTOR) 10 MG tablet Take 10 mg by mouth at bedtime. 10/23/20  Yes [provider]  aspirin EC 325 MG tablet Take 325 mg by mouth daily. Patient not taking: Reported on 11/14/2020    [provider]  traMADol (ULTRAM) 50 MG tablet Take 50 mg by mouth every 6 (six) hours as needed for moderate pain. Patient not taking: Reported on 11/14/2020    [provider]     Allergies:    No Known Allergies   Physical Exam:   Vitals  Blood pressure (!) 139/55, pulse 74, temperature 97.8 F (36.6 C), temperature source Oral, resp. rate 16, height 5\' 4"  (1.626 m), weight 52.2 kg, SpO2 100 %.  Physical Examination: General appearance - alert,  and in no distress  Mental status - alert, oriented to person, place, and time,  Eyes - sclera anicteric Neck - supple, no JVD elevation , Chest - clear  to auscultation bilaterally, symmetrical air movement,  Heart - S1 and S2 normal, regular  Abdomen - soft, nontender, nondistended, no masses or organomegaly Neurological - screening mental status exam normal, neck supple without rigidity, cranial nerves II through XII intact, DTR's normal and symmetric Extremities - no pedal edema  noted, intact peripheral pulses  Skin - warm, dry MSK-right lower extremity is shortened and rotated, point tenderness in the hip area     Data Review:    CBC Recent Labs  Lab 11/14/20 0942  WBC 5.9  HGB 11.3*  HCT 33.5*  PLT 325  MCV 87.0  MCH 29.4  MCHC 33.7  RDW 13.3  LYMPHSABS 1.2  MONOABS  0.5  EOSABS 0.1  BASOSABS 0.1   ------------------------------------------------------------------------------------------------------------------  Chemistries  Recent Labs  Lab 11/14/20 0942  NA 127*  K 3.2*  CL 91*  CO2 27  GLUCOSE 116*  BUN 16  CREATININE 0.51  CALCIUM 9.1  MG 1.8  AST 13*  ALT 9  ALKPHOS 51  BILITOT 0.8   ------------------------------------------------------------------------------------------------------------------ estimated creatinine clearance is 34.7 mL/min (by C-G formula based on SCr of 0.51 mg/dL). ------------------------------------------------------------------------------------------------------------------ No results for input(s): TSH, T4TOTAL, T3FREE, THYROIDAB in the last 72 hours.  Invalid input(s): FREET3   Coagulation profile No results for input(s): INR, PROTIME in the last 168 hours. ------------------------------------------------------------------------------------------------------------------- No results for input(s): DDIMER in the last 72 hours. -------------------------------------------------------------------------------------------------------------------  Cardiac Enzymes No results for input(s): CKMB, TROPONINI, MYOGLOBIN in the last 168 hours.  Invalid input(s): CK ------------------------------------------------------------------------------------------------------------------ No results found for: BNP   ---------------------------------------------------------------------------------------------------------------  Urinalysis No results found for: COLORURINE, APPEARANCEUR, LABSPEC, PHURINE, GLUCOSEU,  HGBUR, BILIRUBINUR, KETONESUR, PROTEINUR, UROBILINOGEN, NITRITE, LEUKOCYTESUR  ----------------------------------------------------------------------------------------------------------------   Imaging Results:    DG Chest 1 View  Result Date: 11/14/2020 CLINICAL DATA:  pain EXAM: CHEST  1 VIEW COMPARISON:  None. FINDINGS: The cardiomediastinal silhouette is enlarged in contour. Prominence of the RIGHT paratracheal stripeatherosclerotic calcifications. Biapical scarring. No pleural effusion. No pneumothorax. No acute pleuroparenchymal abnormality. Visualized abdomen is unremarkable. Age-indeterminate compression fracture of the midthoracic vertebral body; recommend correlation with point tenderness. Lucency and mild cortical irregularity of the LEFT lateral fifth and sixth rib. Remote LEFT posterior sixth and seventh rib fractures. IMPRESSION: 1. No focal consolidation. 2. Mild widening of the RIGHT paratracheal stripe, possibly summation from overlapping vascular structures. Consider further dedicated evaluation with PA and lateral radiograph versus nonemergent chest CT if clinically indicated. 3. Age-indeterminate compression fracture of a midthoracic vertebral body; recommend correlation with point tenderness 4. Subtle lucency traversing several LEFT-sided ribs may reflect nondisplaced rib fractures in the appropriate clinical setting. Correlate with point tenderness Electronically Signed   By: Valentino Saxon M.D.   On: 11/14/2020 10:25   DG Hip Unilat With Pelvis 2-3 Views Right  Result Date: 11/14/2020 CLINICAL DATA:  pain EXAM: DG HIP (WITH PELVIS) 3V RIGHT COMPARISON:  None. FINDINGS: Osteopenia. There is a comminuted intertrochanteric fracture of the RIGHT proximal femur. There is minimal displacement of the fracture fragments. Femoral head appears seated within the acetabulum. Severe vascular calcifications. Pelvic phleboliths. Degenerative changes of the lower lumbar spine. Status post ORIF  of the LEFT femur. Mild cortical thickening of the LEFT superior pubic ramus likely reflecting sequela of remote prior fracture. Limited assessment of the sacrum secondary to overlying bowel contents and osteopenia. IMPRESSION: Comminuted intertrochanteric fracture of the RIGHT proximal femur. Electronically Signed   By: Valentino Saxon M.D.   On: 11/14/2020 10:18    Radiological Exams on Admission: DG Chest 1 View  Result Date: 11/14/2020 CLINICAL DATA:  pain EXAM: CHEST  1 VIEW COMPARISON:  None. FINDINGS: The cardiomediastinal silhouette is enlarged in contour. Prominence of the RIGHT paratracheal stripeatherosclerotic calcifications. Biapical scarring. No pleural effusion. No pneumothorax. No acute pleuroparenchymal abnormality. Visualized abdomen is unremarkable. Age-indeterminate compression fracture of the midthoracic vertebral body; recommend correlation with point tenderness. Lucency and mild cortical irregularity of the LEFT lateral fifth and sixth rib. Remote LEFT posterior sixth and seventh rib fractures. IMPRESSION: 1. No focal consolidation. 2. Mild widening of the RIGHT paratracheal stripe, possibly summation from overlapping vascular structures. Consider further dedicated evaluation with PA and lateral radiograph versus nonemergent chest CT if clinically indicated. 3. Age-indeterminate  compression fracture of a midthoracic vertebral body; recommend correlation with point tenderness 4. Subtle lucency traversing several LEFT-sided ribs may reflect nondisplaced rib fractures in the appropriate clinical setting. Correlate with point tenderness Electronically Signed   By: Valentino Saxon M.D.   On: 11/14/2020 10:25   DG Hip Unilat With Pelvis 2-3 Views Right  Result Date: 11/14/2020 CLINICAL DATA:  pain EXAM: DG HIP (WITH PELVIS) 3V RIGHT COMPARISON:  None. FINDINGS: Osteopenia. There is a comminuted intertrochanteric fracture of the RIGHT proximal femur. There is minimal displacement of  the fracture fragments. Femoral head appears seated within the acetabulum. Severe vascular calcifications. Pelvic phleboliths. Degenerative changes of the lower lumbar spine. Status post ORIF of the LEFT femur. Mild cortical thickening of the LEFT superior pubic ramus likely reflecting sequela of remote prior fracture. Limited assessment of the sacrum secondary to overlying bowel contents and osteopenia. IMPRESSION: Comminuted intertrochanteric fracture of the RIGHT proximal femur. Electronically Signed   By: Valentino Saxon M.D.   On: 11/14/2020 10:18    DVT Prophylaxis -SCD  AM Labs Ordered, also please review Full Orders  Family Communication: Admission, patients condition and plan of care including tests being ordered have been discussed with the patient and family member at bedside who indicate understanding and agree with the plan   Code Status - Full Code  Likely DC to  SNF  Condition   stable  Roxan Hockey M.D on 11/14/2020 at 4:31 PM Go to [email or web address] -  for contact info  [facility] - Office  [phone]

## 2020-11-14 NOTE — ED Notes (Signed)
ED Provider at bedside. 

## 2020-11-14 NOTE — ED Triage Notes (Signed)
Pt brought by ems for fall in kitchen. Pain in R hip over femoral head

## 2020-11-14 NOTE — ED Provider Notes (Signed)
[facility] EMERGENCY DEPARTMENT Provider Note   CSN: [id number] Arrival date & time: 11/14/20  0915     History No chief complaint on file.   Laura Cook is a 87 y.o. female.  HPI She presents for evaluation, by EMS after a fall in her kitchen.  She states she was bringing milk to the table while holding onto things, when she fell.  She is not actively using her walker when she fell.  She has a chronic gait instability.  She complains of pain in her right upper leg only.  She does not think she hit her head or injured her neck when she fell.  During transport she was treated with fentanyl 25 mcg.  Patient denies recent fever, chills, nausea, vomiting, difficulties with bowels or urine.  She denies recent illnesses.  There are no other known active modifying factors.    No past medical history on file.  Patient Active Problem List   Diagnosis Date Noted   Hip fracture ([place]) 11/14/2020     Past Surgical History:  Procedure Laterality Date   YAG LASER APPLICATION Left 2/50/5397   Procedure: YAG LASER APPLICATION;  Surgeon: Williams Che, MD;  Location: [facility];  Service: Ophthalmology;  Laterality: Left;     OB History   No obstetric history on file.     No family history on file.  Social History   Tobacco Use   Smoking status: Unknown    Home Medications Prior to Admission medications   Medication Sig Start Date End Date Taking? Authorizing Provider  aspirin EC 325 MG tablet Take 325 mg by mouth daily.    [provider]  bisacodyl (DULCOLAX) 5 MG EC tablet Take 5 mg by mouth daily as needed for moderate constipation.    [provider]  esomeprazole (NEXIUM) 20 MG capsule Take 20 mg by mouth daily at 12 noon.    [provider]  hydrochlorothiazide (HYDRODIURIL) 50 MG tablet Take 50 mg by mouth daily.    [provider]  traMADol (ULTRAM) 50 MG tablet Take 50 mg by mouth every 6 (six) hours as needed for moderate pain.     [provider]    Allergies    Patient has no known allergies.  Review of Systems   Review of Systems  All other systems reviewed and are negative.  Physical Exam Updated Vital Signs BP (!) 146/63   Pulse 79   Temp 97.6 F (36.4 C) (Oral)   Resp (!) 21   Ht 5\' 4"  (1.626 m)   Wt 52.2 kg   SpO2 100%   BMI 19.74 kg/m   Physical Exam Vitals and nursing note reviewed.  Constitutional:      General: She is not in acute distress.    Appearance: She is well-developed. She is not ill-appearing, toxic-appearing or diaphoretic.     Comments: Elderly, frail  HENT:     Head: Normocephalic and atraumatic.     Mouth/Throat:     Mouth: Mucous membranes are moist.     Pharynx: No oropharyngeal exudate or posterior oropharyngeal erythema.  Eyes:     Conjunctiva/sclera: Conjunctivae normal.     Pupils: Pupils are equal, round, and reactive to light.  Neck:     Trachea: Phonation normal.  Cardiovascular:     Rate and Rhythm: Normal rate.  Pulmonary:     Effort: Pulmonary effort is normal. No respiratory distress.     Breath sounds: No stridor.  Chest:  Chest wall: No tenderness.  Abdominal:     General: There is no distension.     Palpations: Abdomen is soft.     Tenderness: There is no abdominal tenderness.  Musculoskeletal:     Cervical back: Normal range of motion and neck supple.     Comments: Normal range of motion, arms and left leg.  She cannot lift her right leg off the stretcher because of right upper leg and hip pain.  There is also increased discomfort with passive range of motion of the right hip.  There is no tenderness to the distal femur or knee on the right.  Skin:    General: Skin is warm and dry.  Neurological:     Mental Status: She is alert and oriented to person, place, and time.     Cranial Nerves: No cranial nerve deficit.     Motor: No weakness or abnormal muscle tone.     Coordination: Coordination normal.  Psychiatric:        Mood and  Affect: Mood normal.        Behavior: Behavior normal.    ED Results / Procedures / Treatments   Labs (all labs ordered are listed, but only abnormal results are displayed) Labs Reviewed  COMPREHENSIVE METABOLIC PANEL - Abnormal; Notable for the following components:      Result Value   Sodium 127 (*)    Potassium 3.2 (*)    Chloride 91 (*)    Glucose, Bld 116 (*)    Total Protein 6.4 (*)    AST 13 (*)    All other components within normal limits  CBC WITH DIFFERENTIAL/PLATELET - Abnormal; Notable for the following components:   RBC 3.85 (*)    Hemoglobin 11.3 (*)    HCT 33.5 (*)    All other components within normal limits  RESP PANEL BY RT-PCR (FLU A&B, COVID) [id number]  MAGNESIUM  TYPE AND SCREEN    EKG EKG Interpretation  Date/Time:  Sunday November 14 2020 09:24:44 EDT Ventricular Rate:  79 PR Interval:  247 QRS Duration: 93 QT Interval:  412 QTC Calculation: 473 R Axis:   -5 Text Interpretation: Sinus rhythm Prolonged PR interval Probable left atrial enlargement Nonspecific repol abnormality, lateral leads No old tracing to compare Confirmed by Daleen Bo [phone]) on 11/14/2020 10:53:33 AM  Radiology DG Chest 1 View  Result Date: 11/14/2020 CLINICAL DATA:  pain EXAM: CHEST  1 VIEW COMPARISON:  None. FINDINGS: The cardiomediastinal silhouette is enlarged in contour. Prominence of the RIGHT paratracheal stripeatherosclerotic calcifications. Biapical scarring. No pleural effusion. No pneumothorax. No acute pleuroparenchymal abnormality. Visualized abdomen is unremarkable. Age-indeterminate compression fracture of the midthoracic vertebral body; recommend correlation with point tenderness. Lucency and mild cortical irregularity of the LEFT lateral fifth and sixth rib. Remote LEFT posterior sixth and seventh rib fractures. IMPRESSION: 1. No focal consolidation. 2. Mild widening of the RIGHT paratracheal stripe, possibly summation from overlapping vascular structures.  Consider further dedicated evaluation with PA and lateral radiograph versus nonemergent chest CT if clinically indicated. 3. Age-indeterminate compression fracture of a midthoracic vertebral body; recommend correlation with point tenderness 4. Subtle lucency traversing several LEFT-sided ribs may reflect nondisplaced rib fractures in the appropriate clinical setting. Correlate with point tenderness Electronically Signed   By: Valentino Saxon M.D.   On: 11/14/2020 10:25   DG Hip Unilat With Pelvis 2-3 Views Right  Result Date: 11/14/2020 CLINICAL DATA:  pain EXAM: DG HIP (WITH PELVIS) 3V RIGHT COMPARISON:  None. FINDINGS: Osteopenia.  There is a comminuted intertrochanteric fracture of the RIGHT proximal femur. There is minimal displacement of the fracture fragments. Femoral head appears seated within the acetabulum. Severe vascular calcifications. Pelvic phleboliths. Degenerative changes of the lower lumbar spine. Status post ORIF of the LEFT femur. Mild cortical thickening of the LEFT superior pubic ramus likely reflecting sequela of remote prior fracture. Limited assessment of the sacrum secondary to overlying bowel contents and osteopenia. IMPRESSION: Comminuted intertrochanteric fracture of the RIGHT proximal femur. Electronically Signed   By: Valentino Saxon M.D.   On: 11/14/2020 10:18    Procedures .Critical Care Performed by: Daleen Bo, MD Authorized by: Daleen Bo, MD   Critical care provider statement:    Critical care time (minutes):  35   Critical care start time:  11/14/2020 9:40 AM   Critical care end time:  11/14/2020 11:22 AM   Critical care time was exclusive of:  Separately billable procedures and treating other patients   Critical care was necessary to treat or prevent imminent or life-threatening deterioration of the following conditions:  Trauma and metabolic crisis   Critical care was time spent personally by me on the following activities:  Blood draw for specimens,  development of treatment plan with patient or surrogate, discussions with consultants, evaluation of patient's response to treatment, examination of patient, obtaining history from patient or surrogate, ordering and performing treatments and interventions, ordering and review of laboratory studies, pulse oximetry, re-evaluation of patient's condition, review of old charts and ordering and review of radiographic studies   Medications Ordered in ED Medications  0.9 %  sodium chloride infusion ( Intravenous New Bag/Given 11/14/20 0957)  fentaNYL (SUBLIMAZE) injection 25 mcg (has no administration in time range)  methocarbamol (ROBAXIN) tablet 500 mg (has no administration in time range)  potassium chloride 10 mEq in 100 mL IVPB (has no administration in time range)    ED Course  I have reviewed the triage vital signs and the nursing notes.  Pertinent labs & imaging results that were available during my care of the patient were reviewed by me and considered in my medical decision making (see chart for details).  Clinical Course as of 11/14/20 1122  Sun Nov 14, 2020  1054 I discussed the case with on-call orthopedic, Dr. Amedeo Kinsman, he is considering intervention today so we will keep the patient n.p.o. and give IV fluids. [EW]  8270 At this time the patient is able to sit up in bed, and has mild tenderness of the lumbar area, diffusely.  There are no visible injuries to the back.  There is no tenderness or deformity with palpation of the thoracic spine region and both sides ribs, posteriorly. [EW]    Clinical Course User Index [EW] Daleen Bo, MD   MDM Rules/Calculators/A&P                            Patient Vitals for the past 24 hrs:  BP Temp Temp src Pulse Resp SpO2 Height Weight  11/14/20 1100 (!) 146/63 -- -- 79 (!) 21 100 % -- --  11/14/20 0930 (!) 157/80 -- -- 80 18 98 % -- --  11/14/20 0924 -- -- -- -- -- -- 5\' 4"  (1.626 m) 52.2 kg  11/14/20 0921 (!) 160/70 97.6 F (36.4 C) Oral  82 16 96 % -- --    11:03 AM Reevaluation with update and discussion. After initial assessment and treatment, an updated evaluation reveals she is comfortable when  resting, and not moving.  Updated patient and son who was in the room and the findings and the plan. Daleen Bo   Medical Decision Making:  This patient is presenting for evaluation of apparent mechanical fall while walking without her walker, injuring her right hip, which does require a range of treatment options, and is a complaint that involves a high risk of morbidity and mortality. The differential diagnoses include fracture, contusion, hip dislocation. I decided to review old records, and in summary debilitated elderly female, presenting after fall at home.  She does not take anticoagulants.  She is quite healthy for her age and Minimal medication treatment.  I obtained additional historical information from son at bedside.  Clinical Laboratory Tests Ordered, included CBC, Metabolic panel, and blood type, viral panel . Review indicates normal except sodium low, potassium low, chloride low, glucose high, total protein low, AST low, hemoglobin low. Radiologic Tests Ordered, included pelvis, right hip, chest x-ray.  I independently Visualized: Radiograph images, which show angulated right intertrochanteric fracture.  No pelvic fracture, chest x-ray nonspecific findings  Cardiac Monitor Tracing which shows normal sinus rhythm   Critical Interventions-clinical evaluation, laboratory testing, empiric narcotic treatment, radiography, observation, medication treatment for hyperkalemia, IV fluids for hyponatremia, admission plans discussed with hospitalist.  Arrangements made for orthopedic consultation.  Patient kept n.p.o. for possible operative intervention today.  After These Interventions, the Patient was reevaluated and was found to require management for right hip fracture, acute, with likely mechanical fall.  Incidental  findings of hyponatremia and hypokalemia, requiring additional control and treatment.  These may have to be stabilized, prior to orthopedic intervention.  CRITICAL CARE-yes Performed by: Daleen Bo  Nursing Notes Reviewed/ Care Coordinated Applicable Imaging Reviewed Interpretation of Laboratory Data incorporated into ED treatment  11:-5 AM-Consult complete with hospitalist. Patient case explained and discussed.  He agrees to admit patient for further evaluation and treatment. Call ended at 11:18 AM    Final Clinical Impression(s) / ED Diagnoses Final diagnoses:  Closed fracture of right hip, initial encounter [facility])  Fall, initial encounter  Hyponatremia  Hypokalemia    Rx / DC Orders ED Discharge Orders     None        Daleen Bo, MD 11/14/20 1122

## 2020-11-14 NOTE — Consult Note (Signed)
ORTHOPAEDIC CONSULTATION  REQUESTING PHYSICIAN: Roxan Hockey, MD  ASSESSMENT AND PLAN: 87 y.o. female with the following: Right Hip Intertrochanteric femur fracture  This patient requires inpatient admission to the hospitalist, to include preoperative clearance and perioperative medical management  - Weight Bearing Status/Activity: NWB Right lower extremity  - Additional recommended labs/tests: Preop Labs: CBC, BMP, PT/INR, Chest XR, and EKG  -VTE Prophylaxis: Please hold prior to OR; to resume POD#1 at the discretion of the primary team  - Pain control: Recommend PO pain medications PRN; judicious use of narcotics  - Follow-up plan: F/u 10-14 days postop  -Procedures: Plan for OR once patient has been medically optimized  Plan for Right Hip Cephalomedullary nail  Plan for OR 11/15/2020, please keep NPO past midnight     Chief Complaint: Right hip pain  HPI: Laura Cook is a 87 y.o. female who presented to the ED for evaluation after sustaining a mechanical fall.  She is complaining of Right hip pain.  She typically uses a walker to assist with ambulation, however, she was in her kitchen and walking without the walker when she lost her balance.  She did not hit her head.  She is comfortable when she is not moving her hip.  Pain medication also help with the pain.  She denies numbness and tingling. She lives at home with one of her sons.  Of note, she underwent ORIF of a left hip fracture in the later 90s.   History reviewed. No pertinent past medical history. Past Surgical History:  Procedure Laterality Date   YAG LASER APPLICATION Left 06/03/2438   Procedure: YAG LASER APPLICATION;  Surgeon: Williams Che, MD;  Location: [facility];  Service: Ophthalmology;  Laterality: Left;   Social History   Socioeconomic History   Marital status: Widowed    Spouse name: Not on file   Number of children: Not on file   Years of education: Not on file   Highest education  level: Not on file  Occupational History   Not on file  Tobacco Use   Smoking status: Never    Passive exposure: Never   Smokeless tobacco: Never  Vaping Use   Vaping Use: Not on file  Substance and Sexual Activity   Alcohol use: Never   Drug use: Never   Sexual activity: Not Currently  Other Topics Concern   Not on file  Social History Narrative   Not on file   Social Determinants of Health   Financial Resource Strain: Not on file  Food Insecurity: Not on file  Transportation Needs: Not on file  Physical Activity: Not on file  Stress: Not on file  Social Connections: Not on file   History reviewed. No pertinent family history. No Known Allergies Prior to Admission medications   Medication Sig Start Date End Date Taking? Authorizing Provider  acetaminophen (TYLENOL) 325 MG tablet Take 650 mg by mouth every 6 (six) hours as needed.   Yes [provider]  amLODipine (NORVASC) 5 MG tablet Take 5 mg by mouth daily. 11/11/20  Yes [provider]  bisacodyl (DULCOLAX) 5 MG EC tablet Take 5 mg by mouth daily as needed for moderate constipation.   Yes [provider]  esomeprazole (NEXIUM) 20 MG capsule Take 20 mg by mouth daily at 12 noon.   Yes [provider]  hydrochlorothiazide (HYDRODIURIL) 50 MG tablet Take 50 mg by mouth daily.   Yes [provider]  meclizine (ANTIVERT) 25 MG tablet Take 25  mg by mouth every 8 (eight) hours as needed. 07/01/20  Yes [provider]  meloxicam (MOBIC) 15 MG tablet Take 15 mg by mouth daily. 09/07/20  Yes [provider]  metoprolol tartrate (LOPRESSOR) 100 MG tablet Take 100 mg by mouth daily. 10/25/20  Yes [provider]  OVER THE COUNTER MEDICATION Take 1 tablet by mouth daily. calcium   Yes [provider]  RESTASIS 0.05 % ophthalmic emulsion 1 drop 2 (two) times daily. 11/08/20  Yes [provider]  rosuvastatin (CRESTOR) 10 MG tablet Take 10 mg by mouth  at bedtime. 10/23/20  Yes [provider]  aspirin EC 325 MG tablet Take 325 mg by mouth daily. Patient not taking: Reported on 11/14/2020    [provider]  traMADol (ULTRAM) 50 MG tablet Take 50 mg by mouth every 6 (six) hours as needed for moderate pain. Patient not taking: Reported on 11/14/2020    [provider]   DG Chest 1 View  Result Date: 11/14/2020 CLINICAL DATA:  pain EXAM: CHEST  1 VIEW COMPARISON:  None. FINDINGS: The cardiomediastinal silhouette is enlarged in contour. Prominence of the RIGHT paratracheal stripeatherosclerotic calcifications. Biapical scarring. No pleural effusion. No pneumothorax. No acute pleuroparenchymal abnormality. Visualized abdomen is unremarkable. Age-indeterminate compression fracture of the midthoracic vertebral body; recommend correlation with point tenderness. Lucency and mild cortical irregularity of the LEFT lateral fifth and sixth rib. Remote LEFT posterior sixth and seventh rib fractures. IMPRESSION: 1. No focal consolidation. 2. Mild widening of the RIGHT paratracheal stripe, possibly summation from overlapping vascular structures. Consider further dedicated evaluation with PA and lateral radiograph versus nonemergent chest CT if clinically indicated. 3. Age-indeterminate compression fracture of a midthoracic vertebral body; recommend correlation with point tenderness 4. Subtle lucency traversing several LEFT-sided ribs may reflect nondisplaced rib fractures in the appropriate clinical setting. Correlate with point tenderness Electronically Signed   By: Valentino Saxon M.D.   On: 11/14/2020 10:25   DG Hip Unilat With Pelvis 2-3 Views Right  Result Date: 11/14/2020 CLINICAL DATA:  pain EXAM: DG HIP (WITH PELVIS) 3V RIGHT COMPARISON:  None. FINDINGS: Osteopenia. There is a comminuted intertrochanteric fracture of the RIGHT proximal femur. There is minimal displacement of the fracture fragments. Femoral head appears seated within  the acetabulum. Severe vascular calcifications. Pelvic phleboliths. Degenerative changes of the lower lumbar spine. Status post ORIF of the LEFT femur. Mild cortical thickening of the LEFT superior pubic ramus likely reflecting sequela of remote prior fracture. Limited assessment of the sacrum secondary to overlying bowel contents and osteopenia. IMPRESSION: Comminuted intertrochanteric fracture of the RIGHT proximal femur. Electronically Signed   By: Valentino Saxon M.D.   On: 11/14/2020 10:18   Family History Reviewed and non-contributory, no pertinent history of problems with bleeding or anesthesia    Review of Systems No fevers or chills No numbness or tingling No chest pain No shortness of breath No bowel or bladder dysfunction No GI distress No headaches    OBJECTIVE  Vitals:Patient Vitals for the past 8 hrs:  BP Temp Temp src Pulse Resp SpO2  11/14/20 2050 (!) 147/64 98.5 F (36.9 C) Oral 67 (!) 21 97 %  11/14/20 1730 (!) 127/55 (!) 97.4 F (36.3 C) Oral 73 18 96 %   General: Alert, no acute distress Cardiovascular: Extremities are warm Respiratory: No cyanosis, no use of accessory musculature Skin: No lesions in the area of chief complaint  Neurologic: Sensation intact distally  Psychiatric: Patient is competent for consent with normal  mood and affect Lymphatic: No swelling obvious and reported other than the area involved in the exam below Extremities  RLE: Extremity held in a fixed position.  ROM deferred due to known fracture.  Sensation is intact distally in the sural, saphenous, DP, SP, and plantar nerve distribution. 2+ DP pulse.  Toes are WWP.  Active motion intact in the TA/EHL/GS. LLE: Sensation is intact distally in the sural, saphenous, DP, SP, and plantar nerve distribution. 2+ DP pulse.  Toes are WWP.  Active motion intact in the TA/EHL/GS. Tolerates gentle ROM of the hip.  No pain with axial loading.     Test Results Imaging XR of the Right hip  demonstrates a comminuted Intertrochanteric femur fracture.  Left hip with Fixed angle device fixation.   Labs cbc Recent Labs    11/14/20 0942  WBC 5.9  HGB 11.3*  HCT 33.5*  PLT 325    Labs inflam No results for input(s): CRP in the last 72 hours.  Invalid input(s): ESR  Labs coag No results for input(s): INR, PTT in the last 72 hours.  Invalid input(s): PT  Recent Labs    11/14/20 0942  NA 127*  K 3.2*  CL 91*  CO2 27  GLUCOSE 116*  BUN 16  CREATININE 0.51  CALCIUM 9.1

## 2020-11-15 ENCOUNTER — Inpatient Hospital Stay (HOSPITAL_COMMUNITY): Payer: Medicare HMO

## 2020-11-15 ENCOUNTER — Inpatient Hospital Stay (HOSPITAL_COMMUNITY): Payer: Medicare HMO | Admitting: Anesthesiology

## 2020-11-15 ENCOUNTER — Encounter (HOSPITAL_COMMUNITY)
Admission: EM | Disposition: A | Discharge status: Skilled Nursing Facility | Payer: Self-pay | Source: Home / Self Care | Attending: Family Medicine

## 2020-11-15 ENCOUNTER — Encounter (HOSPITAL_COMMUNITY): Payer: Self-pay | Admitting: Family Medicine

## 2020-11-15 DIAGNOSIS — S72001A Fracture of unspecified part of neck of right femur, initial encounter for closed fracture: Secondary | ICD-10-CM | POA: Diagnosis not present

## 2020-11-15 HISTORY — PX: INTRAMEDULLARY (IM) NAIL INTERTROCHANTERIC: SHX5875

## 2020-11-15 LAB — CBC
HCT: 25.8 % — ABNORMAL LOW (ref 36.0–46.0)
Hemoglobin: 8.5 g/dL — ABNORMAL LOW (ref 12.0–15.0)
MCH: 29.3 pg (ref 26.0–34.0)
MCHC: 32.9 g/dL (ref 30.0–36.0)
MCV: 89 fL (ref 80.0–100.0)
Platelets: 264 10*3/uL (ref 150–400)
RBC: 2.9 MIL/uL — ABNORMAL LOW (ref 3.87–5.11)
RDW: 13.2 % (ref 11.5–15.5)
WBC: 8.5 10*3/uL (ref 4.0–10.5)
nRBC: 0 % (ref 0.0–0.2)

## 2020-11-15 LAB — BASIC METABOLIC PANEL
Anion gap: 5 (ref 5–15)
BUN: 22 mg/dL (ref 8–23)
CO2: 24 mmol/L (ref 22–32)
Calcium: 8.3 mg/dL — ABNORMAL LOW (ref 8.9–10.3)
Chloride: 98 mmol/L (ref 98–111)
Creatinine, Ser: 0.36 mg/dL — ABNORMAL LOW (ref 0.44–1.00)
GFR, Estimated: >60 mL/min (ref >60)
Glucose, Bld: 112 mg/dL — ABNORMAL HIGH (ref 70–99)
Potassium: 4.4 mmol/L (ref 3.5–5.1)
Sodium: 127 mmol/L — ABNORMAL LOW (ref 135–145)

## 2020-11-15 LAB — HEMOGLOBIN AND HEMATOCRIT, BLOOD
HCT: 26.3 % — ABNORMAL LOW (ref 36.0–46.0)
Hemoglobin: 8.6 g/dL — ABNORMAL LOW (ref 12.0–15.0)

## 2020-11-15 LAB — PREPARE RBC (CROSSMATCH)

## 2020-11-15 LAB — ABO/RH: ABO/RH(D): O POS

## 2020-11-15 SURGERY — FIXATION, FRACTURE, INTERTROCHANTERIC, WITH INTRAMEDULLARY ROD
Anesthesia: Spinal | Site: Hip | Laterality: Right

## 2020-11-15 MED ORDER — EPHEDRINE SULFATE 50 MG/ML IJ SOLN
INTRAMUSCULAR | Status: DC | PRN
  Administered 2020-11-15: 10 mg via INTRAVENOUS
  Administered 2020-11-15: 5 mg via INTRAVENOUS
  Administered 2020-11-15: 10 mg via INTRAVENOUS

## 2020-11-15 MED ORDER — CEFAZOLIN SODIUM-DEXTROSE 2-4 GM/100ML-% IV SOLN
2.0000 g | Freq: Three times a day (TID) | INTRAVENOUS | Status: AC
  Administered 2020-11-15 – 2020-11-16 (×3): 2 g via INTRAVENOUS
  Filled 2020-11-15 (×3): qty 100

## 2020-11-15 MED ORDER — EPHEDRINE 5 MG/ML INJ
INTRAVENOUS | Status: AC
  Filled 2020-11-15: qty 5

## 2020-11-15 MED ORDER — ORAL CARE MOUTH RINSE: 15.0000 mL | Freq: Once | OROMUCOSAL | Status: AC

## 2020-11-15 MED ORDER — LIDOCAINE HCL (PF) 2 % IJ SOLN
INTRAMUSCULAR | Status: AC
  Filled 2020-11-15: qty 5

## 2020-11-15 MED ORDER — PROPOFOL 500 MG/50ML IV EMUL
INTRAVENOUS | Status: DC | PRN
  Administered 2020-11-15: 20 ug/kg/min via INTRAVENOUS

## 2020-11-15 MED ORDER — SODIUM CHLORIDE 0.9 % IV SOLN: INTRAVENOUS | Status: DC

## 2020-11-15 MED ORDER — SODIUM CHLORIDE 0.9 % IR SOLN
Status: DC | PRN
  Administered 2020-11-15: 1000 mL

## 2020-11-15 MED ORDER — PROPOFOL 10 MG/ML IV BOLUS
INTRAVENOUS | Status: AC
  Filled 2020-11-15: qty 20

## 2020-11-15 MED ORDER — BUPIVACAINE HCL (PF) 0.5 % IJ SOLN
INTRAMUSCULAR | Status: DC | PRN
  Administered 2020-11-15: 30 mL

## 2020-11-15 MED ORDER — PHENYLEPHRINE HCL (PRESSORS) 10 MG/ML IV SOLN
INTRAVENOUS | Status: AC
  Filled 2020-11-15: qty 1

## 2020-11-15 MED ORDER — FENTANYL CITRATE (PF) 100 MCG/2ML IJ SOLN
INTRAMUSCULAR | Status: DC | PRN
  Administered 2020-11-15 (×2): 25 ug via INTRAVENOUS

## 2020-11-15 MED ORDER — CHLORHEXIDINE GLUCONATE CLOTH 2 % EX PADS
6.0000 | MEDICATED_PAD | Freq: Every day | CUTANEOUS | Status: DC
  Administered 2020-11-16 – 2020-11-18 (×3): 6 via TOPICAL

## 2020-11-15 MED ORDER — PHENYLEPHRINE 40 MCG/ML (10ML) SYRINGE FOR IV PUSH (FOR BLOOD PRESSURE SUPPORT)
PREFILLED_SYRINGE | INTRAVENOUS | Status: AC
  Filled 2020-11-15: qty 10

## 2020-11-15 MED ORDER — PHENYLEPHRINE HCL (PRESSORS) 10 MG/ML IV SOLN
INTRAVENOUS | Status: DC | PRN
  Administered 2020-11-15: 60 ug via INTRAVENOUS
  Administered 2020-11-15: 6 ug via INTRAVENOUS
  Administered 2020-11-15 (×2): 60 ug via INTRAVENOUS

## 2020-11-15 MED ORDER — HYDROMORPHONE HCL 1 MG/ML IJ SOLN: 0.2500 mg | INTRAMUSCULAR | Status: DC | PRN

## 2020-11-15 MED ORDER — BUPIVACAINE HCL (PF) 0.5 % IJ SOLN
INTRAMUSCULAR | Status: AC
  Filled 2020-11-15: qty 30

## 2020-11-15 MED ORDER — CHLORHEXIDINE GLUCONATE 0.12 % MT SOLN
15.0000 mL | Freq: Once | OROMUCOSAL | Status: AC
  Administered 2020-11-15: 15 mL via OROMUCOSAL

## 2020-11-15 MED ORDER — ONDANSETRON HCL 4 MG/2ML IJ SOLN
INTRAMUSCULAR | Status: AC
  Filled 2020-11-15: qty 2

## 2020-11-15 MED ORDER — FENTANYL CITRATE (PF) 250 MCG/5ML IJ SOLN
INTRAMUSCULAR | Status: AC
  Filled 2020-11-15: qty 5

## 2020-11-15 SURGICAL SUPPLY — 59 items
BIT DRILL 4.0X280 (BIT) ×2 IMPLANT
BLADE SURG SZ10 CARB STEEL (BLADE) ×4 IMPLANT
BNDG GAUZE ELAST 4 BULKY (GAUZE/BANDAGES/DRESSINGS) ×2 IMPLANT
BRUSH SCRUB EZ W/ULTRADEX 3%PC (MISCELLANEOUS) ×2 IMPLANT
CHLORAPREP W/TINT 26 (MISCELLANEOUS) ×2 IMPLANT
CLOTH BEACON ORANGE TIMEOUT ST (SAFETY) ×2 IMPLANT
COVER LIGHT HANDLE STERIS (MISCELLANEOUS) ×4 IMPLANT
COVER MAYO STAND XLG (MISCELLANEOUS) ×2 IMPLANT
COVER PERINEAL POST (MISCELLANEOUS) ×2 IMPLANT
DRAPE HALF SHEET 40X57 (DRAPES) ×2 IMPLANT
DRAPE INCISE IOBAN 44X35 STRL (DRAPES) ×2 IMPLANT
DRAPE STERI IOBAN 125X83 (DRAPES) ×2 IMPLANT
DRSG PAD ABDOMINAL 8X10 ST (GAUZE/BANDAGES/DRESSINGS) ×2 IMPLANT
DRSG TEGADERM 4.75X4.75 WINDOW (GAUZE/BANDAGES/DRESSINGS) ×8 IMPLANT
DRSG TEGADERM 4X4.75 (GAUZE/BANDAGES/DRESSINGS) ×8 IMPLANT
DRSG XEROFORM 1X8 (GAUZE/BANDAGES/DRESSINGS) ×2 IMPLANT
ELECT REM PT RETURN 9FT ADLT (ELECTROSURGICAL) ×2
ELECTRODE REM PT RTRN 9FT ADLT (ELECTROSURGICAL) ×1 IMPLANT
GAUZE SPONGE 4X4 12PLY STRL (GAUZE/BANDAGES/DRESSINGS) ×2 IMPLANT
GAUZE XEROFORM 1X8 LF (GAUZE/BANDAGES/DRESSINGS) ×2 IMPLANT
GLOVE SRG 8 PF TXTR STRL LF DI (GLOVE) ×1 IMPLANT
GLOVE SURG POLYISO LF SZ8 (GLOVE) ×4 IMPLANT
GLOVE SURG UNDER POLY LF SZ7 (GLOVE) ×4 IMPLANT
GLOVE SURG UNDER POLY LF SZ8 (GLOVE) ×1
GOWN STRL REUS W/ TWL XL LVL3 (GOWN DISPOSABLE) ×1 IMPLANT
GOWN STRL REUS W/TWL LRG LVL3 (GOWN DISPOSABLE) ×2 IMPLANT
GOWN STRL REUS W/TWL XL LVL3 (GOWN DISPOSABLE) ×1
GUIDEROD T2 3X1000 (ROD) IMPLANT
GUIDEWIRE BALL NOSE 3.0X900 (WIRE) ×1
GUIDEWIRE ORTH 900X3XBALL NOSE (WIRE) ×1 IMPLANT
INST SET MAJOR BONE (KITS) ×2 IMPLANT
K-WIRE  3.2X450M STR (WIRE)
K-WIRE 3.2X450M STR (WIRE)
KIT BLADEGUARD II DBL (SET/KITS/TRAYS/PACK) ×2 IMPLANT
KIT TURNOVER CYSTO (KITS) ×2 IMPLANT
KWIRE 3.2X450M STR (WIRE) IMPLANT
MANIFOLD NEPTUNE II (INSTRUMENTS) ×2 IMPLANT
MARKER SKIN DUAL TIP RULER LAB (MISCELLANEOUS) ×2 IMPLANT
NAIL ES LONG 11X130DX39 (Nail) ×2 IMPLANT
NEEDLE HYPO 21X1.5 SAFETY (NEEDLE) ×2 IMPLANT
NEEDLE SPNL 18GX3.5 QUINCKE PK (NEEDLE) ×2 IMPLANT
NS IRRIG 1000ML POUR BTL (IV SOLUTION) ×2 IMPLANT
PACK BASIC III (CUSTOM PROCEDURE TRAY) ×1
PACK SRG BSC III STRL LF ECLPS (CUSTOM PROCEDURE TRAY) ×1 IMPLANT
PAD ARMBOARD 7.5X6 YLW CONV (MISCELLANEOUS) ×2 IMPLANT
PENCIL SMOKE EVACUATOR COATED (MISCELLANEOUS) ×2 IMPLANT
PIN GUIDE THRD AR 3.2X330 (PIN) ×2 IMPLANT
SCREW LOCK CORT 5X34 (Screw) ×2 IMPLANT
SCREW LOCK LAG FEM 10.5X90 (Screw) ×2 IMPLANT
SET BASIN LINEN APH (SET/KITS/TRAYS/PACK) ×2 IMPLANT
SPONGE T-LAP 18X18 ~~LOC~~+RFID (SPONGE) ×4 IMPLANT
STAPLER VISISTAT (STAPLE) ×2 IMPLANT
SUT MON AB 2-0 CT1 36 (SUTURE) ×2 IMPLANT
SUT VIC AB 0 CT1 27 (SUTURE) ×1
SUT VIC AB 0 CT1 27XBRD ANTBC (SUTURE) ×1 IMPLANT
SYR 30ML LL (SYRINGE) ×2 IMPLANT
SYR BULB IRRIG 60ML STRL (SYRINGE) ×4 IMPLANT
TRAY FOLEY MTR SLVR 16FR STAT (SET/KITS/TRAYS/PACK) ×2 IMPLANT
YANKAUER SUCT BULB TIP NO VENT (SUCTIONS) ×2 IMPLANT

## 2020-11-15 NOTE — Progress Notes (Signed)
Patient Demographics:    Laura Cook, is a 87 y.o. female, DOB - [birth year], KGS:[id number]  Admit date - 11/14/2020   Admitting Physician Kanani Mowbray Denton Brick, MD  Outpatient Primary MD for the patient is Monico Blitz, MD  LOS - 1   No chief complaint on file.       Subjective:    Laura Cook today has no fevers, no emesis,  No chest pain,   -Eating okay, patient's son is at bedside,  Assessment  & Plan :    Principal Problem:   Rt Hip fracture /S/p Fall Active Problems:   HTN (hypertension)   Hyponatremia--HCTZ induced   GERD (gastroesophageal reflux disease)   HLD (hyperlipidemia)   Brief Summary:- 87 y.o. female (almost 87 yo) with Pmhx relevant for HTN, HLD, GERD who was admitted on 11/14/2020 after mechanical fall in her kitchen at home and right hip fracture   A/p  1)Rt Hip Fx--status post mechanical fall with right hip fracture -Orthopedic consult appreciated--p S/p  ORIF on 11/15/2020 -Further management per orthopedic team -Pain control and muscle relaxants as ordered   2)Hyponatremia/hypokalemia--- due to HCTZ use -Replace potassium, hydrate with IV fluids and stop HCTZ   3)HTN--stable, continue amlodipine, hold HCTZ due to #2 above  may use IV labetalol when necessary  Every 4 hours for systolic blood pressure over 170 mmhg   4)HLD--hold Crestor, given age of almost 23 years--doubt significant benefit down the road from statin therapy   5)GERD--Protonix as ordered   6) abnormal chest x-ray-----CXR with possible Lt sided rib fractures---may need CT chest to evaluate mild widening of the RIGHT paratracheal stripe   Disposition/Need for in-Hospital Stay- patient unable to be discharged at this time due to -right hip fracture s/p  ORIF as well as hyponatremia-hypokalemia requiring correction and IV fluids* -Awaiting transfer to SNF rehab   Status is: Inpatient   Remains  inpatient appropriate because: see Disposition   Dispo: The patient is from: Home              Anticipated d/c is to: SNF              Anticipated d/c date is: 2 days              Patient currently is not medically stable to d/c. Barriers: Not Clinically Stable-   Code Status :  -  Code Status: Full Code   Family Communication:    NA (patient is alert, awake and coherent)   Consults  :  ortho  DVT Prophylaxis  :   - SCDs   enoxaparin (LOVENOX) injection 40 mg Start: 11/16/20 0000 SCDs Start: 11/14/20 1613 Place TED hose Start: 11/14/20 1613    Lab Results  Component Value Date   PLT 264 11/15/2020    Inpatient Medications  Scheduled Meds:  amLODipine  5 mg Oral Daily   Chlorhexidine Gluconate Cloth  6 each Topical Daily   cycloSPORINE  1 drop Both Eyes BID   [START ON 11/16/2020] enoxaparin (LOVENOX) injection  40 mg Subcutaneous Q24H   methocarbamol  500 mg Oral TID   metoprolol tartrate  100 mg Oral Daily   pantoprazole  40 mg Oral Daily   polyethylene glycol  17 g Oral Daily  sodium chloride flush  3 mL Intravenous Q12H   sodium chloride flush  3 mL Intravenous Q12H   Continuous Infusions:  sodium chloride 1,000 mL/hr at 11/15/20 1605   sodium chloride      ceFAZolin (ANCEF) IV Stopped (11/15/20 1709)   PRN Meds:.sodium chloride, acetaminophen **OR** acetaminophen, bisacodyl, fentaNYL (SUBLIMAZE) injection, labetalol, meclizine, ondansetron **OR** ondansetron (ZOFRAN) IV, oxyCODONE, sodium chloride flush, traZODone    Anti-infectives (From admission, onward)    Start     Dose/Rate Route Frequency Ordered Stop   11/15/20 1600  ceFAZolin (ANCEF) IVPB 2g/100 mL premix        2 g 200 mL/hr over 30 Minutes Intravenous Every 8 hours 11/15/20 1150 11/16/20 1559   11/15/20 0600  ceFAZolin (ANCEF) IVPB 2g/100 mL premix        2 g 200 mL/hr over 30 Minutes Intravenous On call to O.R. 11/14/20 2058 11/15/20 0835         Objective:   Vitals:   11/15/20 1030  11/15/20 1045 11/15/20 1141 11/15/20 1644  BP: 128/80 133/62 132/63 (!) 126/53  Pulse:   87 69  Resp: 15 19 16 18   Temp:   97.7 F (36.5 C) 98.2 F (36.8 C)  TempSrc:   Oral Oral  SpO2: 94% 93% 94% 95%  Weight:      Height:        Wt Readings from Last 3 Encounters:  11/14/20 52.2 kg     Intake/Output Summary (Last 24 hours) at 11/15/2020 1924 Last data filed at 11/15/2020 1805 Gross per 24 hour  Intake 3312.5 ml  Output 500 ml  Net 2812.5 ml     Physical Exam  Physical Examination: General appearance - alert,  and in no distress  Mental status - alert, oriented to person, place, and time,  Eyes - sclera anicteric Neck - supple, no JVD elevation , Chest - clear  to auscultation bilaterally, symmetrical air movement,  Heart - S1 and S2 normal, regular  Abdomen - soft, nontender, nondistended, no masses or organomegaly Neurological - screening mental status exam normal, neck supple without rigidity, cranial nerves II through XII intact, DTR's normal and symmetric Extremities - no pedal edema noted, intact peripheral pulses  Skin - warm, dry MSK-postop wound intact, appropriate postop tenderness  Data Review:   Micro Results Recent Results (from the past 240 hour(s))  Resp Panel by RT-PCR (Flu A&B, Covid) Nasopharyngeal Swab     Status: None   Collection Time: 11/14/20 10:29 AM   Specimen: Nasopharyngeal Swab; Nasopharyngeal(NP) swabs in vial transport medium  Result Value Ref Range Status   SARS Coronavirus 2 by RT PCR NEGATIVE NEGATIVE Final    Comment: (NOTE) SARS-CoV-2 target nucleic acids are NOT DETECTED.  The SARS-CoV-2 RNA is generally detectable in upper respiratory specimens during the acute phase of infection. The lowest concentration of SARS-CoV-2 viral copies this assay can detect is 138 copies/mL. A negative result does not preclude SARS-Cov-2 infection and should not be used as the sole basis for treatment or other patient management decisions. A  negative result may occur with  improper specimen collection/handling, submission of specimen other than nasopharyngeal swab, presence of viral mutation(s) within the areas targeted by this assay, and inadequate number of viral copies(<138 copies/mL). A negative result must be combined with clinical observations, patient history, and epidemiological information. The expected result is Negative.  Fact Sheet for Patients:  EntrepreneurPulse.com.au  Fact Sheet for Healthcare Providers:  IncredibleEmployment.be  This test is no t yet  approved or cleared by the [place] and  has been authorized for detection and/or diagnosis of SARS-CoV-2 by FDA under an Emergency Use Authorization (EUA). This EUA will remain  in effect (meaning this test can be used) for the duration of the COVID-19 declaration under Section 564(b)(1) of the Act, 21 U.S.C.section 360bbb-3(b)(1), unless the authorization is terminated  or revoked sooner.       Influenza A by PCR NEGATIVE NEGATIVE Final   Influenza B by PCR NEGATIVE NEGATIVE Final    Comment: (NOTE) The Xpert Xpress SARS-CoV-2/FLU/RSV plus assay is intended as an aid in the diagnosis of influenza from Nasopharyngeal swab specimens and should not be used as a sole basis for treatment. Nasal washings and aspirates are unacceptable for Xpert Xpress SARS-CoV-2/FLU/RSV testing.  Fact Sheet for Patients: EntrepreneurPulse.com.au  Fact Sheet for Healthcare Providers: IncredibleEmployment.be  This test is not yet approved or cleared by the [place] FDA and has been authorized for detection and/or diagnosis of SARS-CoV-2 by FDA under an Emergency Use Authorization (EUA). This EUA will remain in effect (meaning this test can be used) for the duration of the COVID-19 declaration under Section 564(b)(1) of the Act, 21 U.S.C. section 360bbb-3(b)(1), unless the authorization  is terminated or revoked.  Performed at [facility], [address] [place], [place] [address]     Radiology Reports DG Chest 1 View  Result Date: 11/14/2020 CLINICAL DATA:  pain EXAM: CHEST  1 VIEW COMPARISON:  None. FINDINGS: The cardiomediastinal silhouette is enlarged in contour. Prominence of the RIGHT paratracheal stripeatherosclerotic calcifications. Biapical scarring. No pleural effusion. No pneumothorax. No acute pleuroparenchymal abnormality. Visualized abdomen is unremarkable. Age-indeterminate compression fracture of the midthoracic vertebral body; recommend correlation with point tenderness. Lucency and mild cortical irregularity of the LEFT lateral fifth and sixth rib. Remote LEFT posterior sixth and seventh rib fractures. IMPRESSION: 1. No focal consolidation. 2. Mild widening of the RIGHT paratracheal stripe, possibly summation from overlapping vascular structures. Consider further dedicated evaluation with PA and lateral radiograph versus nonemergent chest CT if clinically indicated. 3. Age-indeterminate compression fracture of a midthoracic vertebral body; recommend correlation with point tenderness 4. Subtle lucency traversing several LEFT-sided ribs may reflect nondisplaced rib fractures in the appropriate clinical setting. Correlate with point tenderness Electronically Signed   By: Valentino Saxon M.D.   On: 11/14/2020 10:25   DG HIP OPERATIVE UNILAT WITH PELVIS RIGHT  Result Date: 11/15/2020 CLINICAL DATA:  Hip fracture require operative repair. EXAM: OPERATIVE RIGHT HIP (WITH PELVIS IF PERFORMED) TECHNIQUE: Fluoroscopic spot image(s) were submitted for interpretation post-operatively. FLUOROSCOPY TIME:  Fluoroscopy Time: 1 minute 44 seconds Radiation Exposure Index: 20.4 mGy COMPARISON:  Right hip radiographs 11/14/2020 FINDINGS: Six intraoperative fluoroscopic images are submitted and demonstrate ORIF of the intertrochanteric right femur fracture with placement of an  intramedullary nail, hip screw, and more distal interlocking screw. Alignment is grossly anatomic on these limited fluoroscopic images. IMPRESSION: Intraoperative images during ORIF of a right femur fracture. Electronically Signed   By: Logan Bores M.D.   On: 11/15/2020 11:29   DG Hip Unilat With Pelvis 2-3 Views Right  Result Date: 11/14/2020 CLINICAL DATA:  pain EXAM: DG HIP (WITH PELVIS) 3V RIGHT COMPARISON:  None. FINDINGS: Osteopenia. There is a comminuted intertrochanteric fracture of the RIGHT proximal femur. There is minimal displacement of the fracture fragments. Femoral head appears seated within the acetabulum. Severe vascular calcifications. Pelvic phleboliths. Degenerative changes of the lower lumbar spine. Status post ORIF of the LEFT femur. Mild cortical  thickening of the LEFT superior pubic ramus likely reflecting sequela of remote prior fracture. Limited assessment of the sacrum secondary to overlying bowel contents and osteopenia. IMPRESSION: Comminuted intertrochanteric fracture of the RIGHT proximal femur. Electronically Signed   By: Valentino Saxon M.D.   On: 11/14/2020 10:18   DG FEMUR PORT, MIN 2 VIEWS RIGHT  Result Date: 11/15/2020 CLINICAL DATA:  Status post internal fixation of right femur fracture. EXAM: RIGHT FEMUR PORTABLE 2 VIEW COMPARISON:  Intraoperative fluoroscopic images earlier today. Right hip radiographs 11/14/2020. FINDINGS: There has been ORIF of the previously described right intertrochanteric femur fracture with placement of an intramedullary nail and hip screw. Alignment is grossly anatomic. The right hip and knee are located. Skin staples and postoperative soft tissue gas are noted. There is atherosclerotic vascular calcification. IMPRESSION: ORIF of intertrochanteric right femur fracture. Electronically Signed   By: Logan Bores M.D.   On: 11/15/2020 11:26     CBC Recent Labs  Lab 11/14/20 0942 11/15/20 0523 11/15/20 1029  WBC 5.9 8.5  --   HGB 11.3*  8.5* 8.6*  HCT 33.5* 25.8* 26.3*  PLT 325 264  --   MCV 87.0 89.0  --   MCH 29.4 29.3  --   MCHC 33.7 32.9  --   RDW 13.3 13.2  --   LYMPHSABS 1.2  --   --   MONOABS 0.5  --   --   EOSABS 0.1  --   --   BASOSABS 0.1  --   --     Chemistries  Recent Labs  Lab 11/14/20 0942 11/15/20 0523  NA 127* 127*  K 3.2* 4.4  CL 91* 98  CO2 27 24  GLUCOSE 116* 112*  BUN 16 22  CREATININE 0.51 0.36*  CALCIUM 9.1 8.3*  MG 1.8  --   AST 13*  --   ALT 9  --   ALKPHOS 51  --   BILITOT 0.8  --    ------------------------------------------------------------------------------------------------------------------ No results for input(s): CHOL, HDL, LDLCALC, TRIG, CHOLHDL, LDLDIRECT in the last 72 hours.  No results found for: HGBA1C ------------------------------------------------------------------------------------------------------------------ No results for input(s): TSH, T4TOTAL, T3FREE, THYROIDAB in the last 72 hours.  Invalid input(s): FREET3 ------------------------------------------------------------------------------------------------------------------ No results for input(s): VITAMINB12, FOLATE, FERRITIN, TIBC, IRON, RETICCTPCT in the last 72 hours.  Coagulation profile No results for input(s): INR, PROTIME in the last 168 hours.  No results for input(s): DDIMER in the last 72 hours.  Cardiac Enzymes No results for input(s): CKMB, TROPONINI, MYOGLOBIN in the last 168 hours.  Invalid input(s): CK ------------------------------------------------------------------------------------------------------------------ No results found for: BNP   Roxan Hockey M.D on 11/15/2020 at 7:24 PM  Go to [email or web address] - for contact info  [facility] - Office  [phone]

## 2020-11-15 NOTE — Progress Notes (Addendum)
   ORTHOPAEDIC PROGRESS NOTE  Scheduled for Procedure(s): Cephalomedullary nail for right intertrochanteric femur fracture    DOS: 11/15/20  SUBJECTIVE: No issues over night.  Pain is controlled.  No numbness or tingling.  All questions answered, she is ready for surgery.  NPO since midnight.   OBJECTIVE: PE:  Alert and oriented.  No acute distress  Right leg is shortened and externally rotated Sensation to right foot intact 2+ DP pulse Active motion in EHL/TA  Vitals:   11/15/20 0545 11/15/20 0719  BP: (!) 139/94 (!) 146/66  Pulse: 61 62  Resp: 13 14  Temp: 97.8 F (36.6 C) 98.4 F (36.9 C)  SpO2: 100% 97%   CBC Latest Ref Rng & Units 11/15/2020 11/14/2020  WBC 4.0 - 10.5 K/uL 8.5 5.9  Hemoglobin 12.0 - 15.0 g/dL 8.5(L) 11.3(L)  Hematocrit 36.0 - 46.0 % 25.8(L) 33.5(L)  Platelets 150 - 400 K/uL 264 325     ASSESSMENT: Laura Cook is a 87 y.o. female stable, ready for OR.  Hb low prior to surgery, will ensure type and screen.  PLAN: Weightbearing: NWB RLE Insicional and dressing care: Reinforce dressings as needed; none currently Orthopedic device(s): None VTE prophylaxis: to resume POD#1; at discretion of medicine team.  Recommend 81 mg aspirin BID Pain control: PO pain medication PRN; judicious use of narcotics.  Follow - up plan: 2 weeks   Contact information:     Gavon Majano A. Amedeo Kinsman, MD [facility] [address] [place],  [place]  [address] Phone: [phone] Fax: [phone]

## 2020-11-15 NOTE — Anesthesia Postprocedure Evaluation (Signed)
Anesthesia Post Note  Patient: Laura Cook  Procedure(s) Performed: INTRAMEDULLARY (IM) NAIL INTERTROCHANTRIC (Right: Hip)  Patient location during evaluation: PACU Anesthesia Type: Combined General/Spinal Level of consciousness: awake and alert and oriented Pain management: pain level controlled Vital Signs Assessment: post-procedure vital signs reviewed and stable Respiratory status: spontaneous breathing and respiratory function stable Cardiovascular status: blood pressure returned to baseline and stable Postop Assessment: no apparent nausea or vomiting Anesthetic complications: no   No notable events documented.   Last Vitals:  Vitals:   11/15/20 1045 11/15/20 1141  BP: 133/62 132/63  Pulse:  87  Resp: 19 16  Temp:  36.5 C  SpO2: 93% 94%    Last Pain:  Vitals:   11/15/20 1141  TempSrc: Oral  PainSc: 0-No pain                 Zorawar Strollo C Ruchama Kubicek

## 2020-11-15 NOTE — Op Note (Signed)
Orthopaedic Surgery Operative Note (CSN: [id number])  Laura Cook  1923/01/28 Date of Surgery: 11/15/2020   Diagnoses:  Right intertrochanteric femur fracture  Procedure: Cephalomedullary nail for Right intertrochanteric femur fracture   Operative Finding Successful completion of the planned procedure.  Cephalomedullary nail placed without issue.     Post-Op Diagnosis: Same Surgeons:Primary: Mordecai Rasmussen, MD Assistants:  Vevelyn Royals Location: [facility] OR ROOM 3 Anesthesia: Spinal + sedation Antibiotics: Ancef 2 g Tourniquet time: N/A Estimated Blood Loss: 916 cc Complications: None Specimens: None Implants: Implant Name Type Inv. Item Serial No. Manufacturer Lot No. LRB No. Used Action  NAIL ES LONG [id number] - [id number] Nail NAIL ES LONG [id number]  [employer]  Right 1 Implanted  SCREW LOCK LAG FEM 10.5X90 - [id number] Screw SCREW LOCK LAG FEM 10.5X90  [employer]  Right 1 Implanted  SCREW LOCK CORT 5X34 - [id number] Screw SCREW LOCK CORT 5X34  [employer]  Right 1 Implanted    Indications for Surgery:   Laura Cook is a 87 y.o. female who had a mechanical fall and sustained a Right intertrochanteric femur fracture.  I recommended operative fixation to restore stability and allow the patient to ambulate immediately postop.  Benefits and risks of operative and nonoperative management were discussed prior to surgery with patient and informed consent form was completed.  Specific risks including infection, need for additional surgery, persistent pain, bleeding, malunion, nonunion and more severe complications associated with anesthesia.  The patient elected to proceed and surgical consent was obtained.  She was cleared by the medicine team and anesthesia, prior to proceeding with surgery.    Procedure:   The patient was identified properly. Informed consent was obtained and the surgical site was marked. The patient was taken to the OR where a spinal and sedation was induced.    The patient was placed supine on a fracture table and appropriate reduction was obtained and visualized on fluoroscopy prior to the beginning of the procedure.  Timeout was performed before the beginning of the case.  Ancef 2 g dosing was confirmed prior to making incision.  The patient received TXA prior to the start of surgery.   We made an incision proximal to the greater trochanter and dissected down through the fascia.  We then carefully placed a guidepin, localizing under fluoroscopy.  Once satisfied with the starting point, the entry reamer was used to gain entry into the intramedullary canal.  A ball tip guidewire was then introduced and passed to an appropriate level at the physeal scar of the distal femur and measurement was obtained proximally using fluoroscopy.  We selected the appropriate length of nail, as noted above.  Entry reamer was used needed but the nail was unreamed.  At this point we placed our nail localizing under fluoroscopy, and confirmed that it was at the appropriate level.  Next we used the outrigger device to pass a wire into the femoral neck, and then the cephalomedullary lag screw.  We then turned our attention to the distal interlocking screw.  Once again, we used the outrigger device to place a single interlocking screw in the midshaft area.  The outrigger device was removed and final fluoroscopic images were obtained.  The wounds were thoroughly irrigated closed in a multilayer fashion with 0 vicryl, 2-0 monocryl and staples.  Sterile dressings were placed.  The patient was awoken from general anesthesia and taken to the PACU in stable condition without complication.     Post-operative plan:  Weightbearing:  The patient will be WBAT on the operative extremity.   DVT prophylaxis per primary team, no orthopedic contraindications.  Recommend 81 mg Aspirin BID, unless patient cannot tolerate or was previously on anticoagulation.  Prefer to start Ppx POD#1 Pain control  with PRN pain medication preferring oral medicines.   Dressing can be reinforced as needed, will change on POD#2/3 if needed.  Patient does not need to remain hospitalized for dressing change Follow up plan: approximately 2 weeks postop for incision check and XR.  If the patient will be returning to a nursing facility, staples can be removed around this time and a follow up appointment can be scheduled for 6 weeks after surgery. XR at next visit:  please obtain [facility] pelvis, and 2 views of the Right hip

## 2020-11-15 NOTE — Anesthesia Preprocedure Evaluation (Signed)
Anesthesia Evaluation  Patient identified by MRN, date of birth, ID band Patient awake    Reviewed: Allergy & Precautions, NPO status , Patient's Chart, lab work & pertinent test results, reviewed documented beta blocker date and time   Airway Mallampati: II  TM Distance: >3 FB Neck ROM: Full    Dental  (+) Edentulous Upper, Edentulous Lower   Pulmonary neg pulmonary ROS,    Pulmonary exam normal breath sounds clear to auscultation       Cardiovascular Exercise Tolerance: Poor hypertension, Pt. on home beta blockers and Pt. on medications Normal cardiovascular exam Rhythm:Regular Rate:Normal  14-Nov-2020 09:24:44 [facility] ROUTINE RECORD 01-26-1923 (89 yr) Female Caucasian Vent. rate 79 BPM PR interval 247 ms QRS duration 93 ms QT/QTcB 412/473 ms P-R-T axes 68 -5 100 Sinus rhythm Prolonged PR interval Probable left atrial enlargement Nonspecific repol abnormality, lateral leads   Neuro/Psych negative neurological ROS  negative psych ROS   GI/Hepatic Neg liver ROS, GERD  Medicated and Controlled,  Endo/Other    Renal/GU Renal disease (hyponatremia)     Musculoskeletal  (+) Arthritis , Osteoarthritis,  Right hip fx,   Abdominal   Peds  Hematology  (+) anemia ,   Anesthesia Other Findings IMPRESSION: 1. No focal consolidation. 2. Mild widening of the RIGHT paratracheal stripe, possiblysummation from overlapping vascular structures. Consider further dedicated evaluation with PA and lateral radiograph versus nonemergent chest CT if clinically indicated. 3. Age-indeterminate compression fracture of a midthoracic vertebral body; recommend correlation with point tenderness 4. Subtle lucency traversing several LEFT-sided ribs may reflect nondisplaced rib fractures in the appropriate clinical setting. Correlate with point tenderness   Electronically Signed   By: Valentino Saxon M.D.    On: 11/14/2020 10:25  Reproductive/Obstetrics                             Anesthesia Physical Anesthesia Plan  ASA: 3  Anesthesia Plan: General/Spinal   Post-op Pain Management:    Induction: Intravenous  PONV Risk Score and Plan: Ondansetron  Airway Management Planned: Nasal Cannula, Natural Airway and Simple Face Mask  Additional Equipment:   Intra-op Plan:   Post-operative Plan:   Informed Consent: I have reviewed the patients History and Physical, chart, labs and discussed the procedure including the risks, benefits and alternatives for the proposed anesthesia with the patient or authorized representative who has indicated his/her understanding and acceptance.     Dental advisory given  Plan Discussed with: CRNA and Surgeon  Anesthesia Plan Comments: (Possible GA with airway was explained.)        Anesthesia Quick Evaluation

## 2020-11-15 NOTE — [place] FL2 (Signed)
[place] MEDICAID FL2 LEVEL OF CARE SCREENING TOOL     IDENTIFICATION  Patient Name: Laura Cook Birthdate: 1922-06-23 Sex: female Admission Date (Current Location): 11/14/2020  [facility] and [place] Number:  [employer] and Address:  [facility] [address] [place]      Provider Number: [id number]  Attending Physician Name and Address:  Roxan Hockey, MD  Relative Name and Phone Number:       Current Level of Care: Hospital Recommended Level of Care: [place] Prior Approval Number:    Date Approved/Denied:   PASRR Number: [id number]  Discharge Plan: SNF    Current Diagnoses: Patient Active Problem List   Diagnosis Date Noted   Rt Hip fracture /S/p Fall 11/14/2020   HTN (hypertension) 11/14/2020   Hyponatremia--HCTZ induced 11/14/2020   GERD (gastroesophageal reflux disease) 11/14/2020   HLD (hyperlipidemia) 11/14/2020    Orientation RESPIRATION BLADDER Height & Weight     Self, Time, Situation, Place  Normal External catheter Weight: 115 lb (52.2 kg) Height:  5\' 4"  (162.6 cm)  BEHAVIORAL SYMPTOMS/MOOD NEUROLOGICAL BOWEL NUTRITION STATUS      Incontinent Diet (NPO for surgery. See d/c summary for updates.)  AMBULATORY STATUS COMMUNICATION OF NEEDS Skin   Extensive Assist Verbally Surgical wounds, Bruising                       Personal Care Assistance Level of Assistance  Bathing, Feeding, Dressing Bathing Assistance: Maximum assistance Feeding assistance: Limited assistance Dressing Assistance: Maximum assistance     Functional Limitations Info  Sight, Hearing, Speech Sight Info: Impaired Hearing Info: Adequate Speech Info: Adequate    SPECIAL CARE FACTORS FREQUENCY  PT (By licensed PT)     PT Frequency: 5x weekly              Contractures      Additional Factors Info  Code Status, Allergies Code Status Info: Full code Allergies Info: No known allergies            Current Medications (11/15/2020):  This is the current hospital active medication list Current Facility-Administered Medications  Medication Dose Route Frequency Provider Last Rate Last Admin   0.9 %  sodium chloride infusion   Intravenous Continuous Mordecai Rasmussen, MD 1,000 mL/hr at 11/15/20 0945 New Bag at 11/15/20 0945   0.9 %  sodium chloride infusion  250 mL Intravenous PRN Mordecai Rasmussen, MD       acetaminophen (TYLENOL) tablet 650 mg  650 mg Oral Q6H PRN Mordecai Rasmussen, MD       Or   acetaminophen (TYLENOL) suppository 650 mg  650 mg Rectal Q6H PRN Mordecai Rasmussen, MD       amLODipine (NORVASC) tablet 5 mg  5 mg Oral Daily Larena Glassman A, MD   5 mg at 11/15/20 1315   bisacodyl (DULCOLAX) suppository 10 mg  10 mg Rectal Daily PRN Mordecai Rasmussen, MD       ceFAZolin (ANCEF) IVPB 2g/100 mL premix  2 g Intravenous Q8H Larena Glassman A, MD       cycloSPORINE (RESTASIS) 0.05 % ophthalmic emulsion 1 drop  1 drop Both Eyes BID Larena Glassman A, MD   1 drop at 11/14/20 2046   [START ON 11/16/2020] enoxaparin (LOVENOX) injection 40 mg  40 mg Subcutaneous Q24H Mordecai Rasmussen, MD       fentaNYL (SUBLIMAZE) injection 25 mcg  25 mcg Intravenous Q2H PRN  Mordecai Rasmussen, MD   25 mcg at 11/14/20 1946   labetalol (NORMODYNE) injection 10 mg  10 mg Intravenous Q4H PRN Mordecai Rasmussen, MD       meclizine (ANTIVERT) tablet 25 mg  25 mg Oral Q8H PRN Mordecai Rasmussen, MD       methocarbamol (ROBAXIN) tablet 500 mg  500 mg Oral TID Mordecai Rasmussen, MD   500 mg at 11/15/20 1314   metoprolol tartrate (LOPRESSOR) tablet 100 mg  100 mg Oral Daily Larena Glassman A, MD   100 mg at 11/15/20 1314   ondansetron (ZOFRAN) tablet 4 mg  4 mg Oral Q6H PRN Mordecai Rasmussen, MD       Or   ondansetron [facility]) injection 4 mg  4 mg Intravenous Q6H PRN Mordecai Rasmussen, MD   4 mg at 11/14/20 1744   oxyCODONE (Oxy IR/ROXICODONE) immediate release tablet 5 mg  5 mg Oral Q4H PRN Mordecai Rasmussen, MD   5 mg at 11/14/20 1731   pantoprazole (PROTONIX)  EC tablet 40 mg  40 mg Oral Daily Mordecai Rasmussen, MD   40 mg at 11/15/20 1314   polyethylene glycol (MIRALAX / GLYCOLAX) packet 17 g  17 g Oral Daily Mordecai Rasmussen, MD   17 g at 11/15/20 1315   sodium chloride flush (NS) 0.9 % injection 3 mL  3 mL Intravenous Q12H Larena Glassman A, MD       sodium chloride flush (NS) 0.9 % injection 3 mL  3 mL Intravenous Q12H Larena Glassman A, MD       sodium chloride flush (NS) 0.9 % injection 3 mL  3 mL Intravenous PRN Mordecai Rasmussen, MD       traZODone (DESYREL) tablet 50 mg  50 mg Oral QHS PRN Mordecai Rasmussen, MD         Discharge Medications: Please see discharge summary for a list of discharge medications.  Relevant Imaging Results:  Relevant Lab Results:   Additional Information SSN: [id number]. Pt reports she received Moderna vaccines, including 1 booster.  Salome Arnt, LCSW

## 2020-11-15 NOTE — TOC Initial Note (Addendum)
Transition of Care [facility]) - Initial/Assessment Note    Patient Details  Name: Laura Cook MRN: [id number] Date of Birth: [birth year]  Transition of [facility] CM/SW Contact:    Salome Arnt, [place] Phone Number: 11/15/2020, 1:26 PM  Clinical Narrative:  Pt admitted due to right hip fracture. Pt had surgery earlier this morning. She reports she lives with her son who assists with ADLs as needed. Pt ambulates with a walker at baseline. PT pending. Discussed with pt she will likely need SNF. Pt is agreeable and requests [facility]. Initiated bed search and will have SNF start authorization once selected. LCSW also discussed above with pt's son.                Update: [facility] offered bed which pt accepts. Son aware. Mardene Celeste at [facility] notified and will start [facility].   Expected Discharge Plan: Skilled Nursing Facility Barriers to Discharge: Continued Medical Work up   Patient Goals and CMS Choice Patient states their goals for this hospitalization and ongoing recovery are:: short term SNF   Choice offered to / list presented to : Patient  Expected Discharge Plan and Services Expected Discharge Plan: [place] In-house Referral: Clinical Social Work   Post Acute Care Choice: [place] Living arrangements for the past 2 months: [place]                 DME Arranged: N/A                    Prior Living Arrangements/Services Living arrangements for the past 2 months: Single Family Home Lives with:: Adult Children Patient language and need for interpreter reviewed:: Yes Do you feel safe going back to the place where you live?: Yes          Current home services: DME (cane, walker, 3N1) Criminal Activity/Legal Involvement Pertinent to Current Situation/Hospitalization: No - Comment as needed  Activities of Daily Living Home Assistive Devices/Equipment: Environmental consultant (specify type), Cane (specify quad or straight), Bedside  commode/3-in-1 ADL Screening (condition at time of admission) Patient's cognitive ability adequate to safely complete daily activities?: Yes Is the patient deaf or have difficulty hearing?: Yes Does the patient have difficulty seeing, even when wearing glasses/contacts?: Yes Does the patient have difficulty concentrating, remembering, or making decisions?: No Patient able to express need for assistance with ADLs?: Yes Does the patient have difficulty dressing or bathing?: Yes Independently performs ADLs?: No Communication: Independent Dressing (OT): Needs assistance Is this a change from baseline?: Change from baseline, expected to last >3 days Grooming: Independent Feeding: Independent Bathing: Needs assistance Is this a change from baseline?: Change from baseline, expected to last >3 days Toileting: Needs assistance Is this a change from baseline?: Change from baseline, expected to last >3days In/Out Bed: Needs assistance Is this a change from baseline?: Change from baseline, expected to last >3 days Walks in Home: Needs assistance Is this a change from baseline?: Change from baseline, expected to last >3 days Does the patient have difficulty walking or climbing stairs?: Yes Weakness of Legs: Right Weakness of Arms/Hands: None  Permission Sought/Granted                  Emotional Assessment   Attitude/Demeanor/Rapport: Engaged Affect (typically observed): Accepting Orientation: : Oriented to Self, Oriented to Place, Oriented to  Time, Oriented to Situation Alcohol / Substance Use: Not Applicable Psych Involvement: No (comment)  Admission diagnosis:  Hypokalemia [E87.6] Hip fracture ([place]) [S72.009A]  Hyponatremia [E87.1] Fall, initial encounter [W19.XXXA] Closed fracture of right hip, initial encounter [facility]) [S72.001A] Patient Active Problem List   Diagnosis Date Noted   Rt Hip fracture /S/p Fall 11/14/2020   HTN (hypertension) 11/14/2020   Hyponatremia--HCTZ  induced 11/14/2020   GERD (gastroesophageal reflux disease) 11/14/2020   HLD (hyperlipidemia) 11/14/2020   PCP:  Monico Blitz, MD Pharmacy:   [id number] [place], [place] [address] [place] [address] Phone: [phone] Fax: [phone]     Social Determinants of Health (SDOH) Interventions    Readmission Risk Interventions No flowsheet data found.

## 2020-11-15 NOTE — Anesthesia Procedure Notes (Signed)
Spinal  Patient location during procedure: OR Start time: 11/15/2020 8:10 AM Reason for block: surgical anesthesia Preanesthetic Checklist Completed: patient identified, IV checked, site marked, risks and benefits discussed, surgical consent, monitors and equipment checked, pre-op evaluation and timeout performed Spinal Block Patient position: right lateral decubitus Prep: Betadine Patient monitoring: heart rate, cardiac monitor, continuous pulse ox and blood pressure Approach: right paramedian Location: L3-4 Injection technique: single-shot Needle Needle type: Spinocan  Needle gauge: 22 G Needle length: 9 cm Assessment Sensory level: T8 Events: CSF return Additional Notes ATTEMPTS:1 TRAY SW:[id number] TRAY EXPIRATION DATE:01/19/22

## 2020-11-15 NOTE — Transfer of Care (Signed)
Immediate Anesthesia Transfer of Care Note  Patient: Laura Cook  Procedure(s) Performed: INTRAMEDULLARY (IM) NAIL INTERTROCHANTRIC (Right: Hip)  Patient Location: PACU  Anesthesia Type:Spinal  Level of Consciousness: awake  Airway & Oxygen Therapy: Patient Spontanous Breathing  Post-op Assessment: Report given to RN  Post vital signs: Reviewed and stable  Last Vitals:  Vitals Value Taken Time  BP 99/87 11/15/20 1006  Temp    Pulse 81 11/15/20 1008  Resp 17 11/15/20 1010  SpO2 90 % 11/15/20 1008  Vitals shown include unvalidated device data.  Last Pain:  Vitals:   11/15/20 0719  TempSrc: Oral  PainSc: 0-No pain         Complications: No notable events documented.

## 2020-11-15 NOTE — Progress Notes (Signed)
Dentures and glasses left at bedside in pt room 328

## 2020-11-16 ENCOUNTER — Encounter (HOSPITAL_COMMUNITY): Payer: Self-pay | Admitting: Orthopedic Surgery

## 2020-11-16 LAB — URINALYSIS, ROUTINE W REFLEX MICROSCOPIC
Bilirubin Urine: NEGATIVE
Glucose, UA: NEGATIVE mg/dL
Ketones, ur: NEGATIVE mg/dL
Leukocytes,Ua: NEGATIVE
Nitrite: NEGATIVE
Protein, ur: NEGATIVE mg/dL
Specific Gravity, Urine: 1.011 (ref 1.005–1.030)
pH: 5 (ref 5.0–8.0)

## 2020-11-16 LAB — CBC
HCT: 22.3 % — ABNORMAL LOW (ref 36.0–46.0)
Hemoglobin: 7.4 g/dL — ABNORMAL LOW (ref 12.0–15.0)
MCH: 29.8 pg (ref 26.0–34.0)
MCHC: 33.2 g/dL (ref 30.0–36.0)
MCV: 89.9 fL (ref 80.0–100.0)
Platelets: 218 10*3/uL (ref 150–400)
RBC: 2.48 MIL/uL — ABNORMAL LOW (ref 3.87–5.11)
RDW: 13.5 % (ref 11.5–15.5)
WBC: 10.3 10*3/uL (ref 4.0–10.5)
nRBC: 0 % (ref 0.0–0.2)

## 2020-11-16 LAB — BASIC METABOLIC PANEL
Anion gap: 7 (ref 5–15)
BUN: 11 mg/dL (ref 8–23)
CO2: 21 mmol/L — ABNORMAL LOW (ref 22–32)
Calcium: 7.9 mg/dL — ABNORMAL LOW (ref 8.9–10.3)
Chloride: 101 mmol/L (ref 98–111)
Creatinine, Ser: 0.4 mg/dL — ABNORMAL LOW (ref 0.44–1.00)
GFR, Estimated: >60 mL/min (ref >60)
Glucose, Bld: 111 mg/dL — ABNORMAL HIGH (ref 70–99)
Potassium: 3.4 mmol/L — ABNORMAL LOW (ref 3.5–5.1)
Sodium: 129 mmol/L — ABNORMAL LOW (ref 135–145)

## 2020-11-16 LAB — PREPARE RBC (CROSSMATCH)

## 2020-11-16 MED ORDER — SODIUM CHLORIDE 0.9% IV SOLUTION: Freq: Once | INTRAVENOUS | Status: AC

## 2020-11-16 MED ORDER — LORAZEPAM 2 MG/ML IJ SOLN
0.5000 mg | Freq: Once | INTRAMUSCULAR | Status: AC
  Administered 2020-11-16: 0.5 mg via INTRAVENOUS
  Filled 2020-11-16: qty 1

## 2020-11-16 MED ORDER — LORAZEPAM 2 MG/ML IJ SOLN
0.5000 mg | Freq: Two times a day (BID) | INTRAMUSCULAR | Status: DC | PRN
  Administered 2020-11-17: 0.5 mg via INTRAVENOUS
  Filled 2020-11-16 (×2): qty 1

## 2020-11-16 MED ORDER — FUROSEMIDE 10 MG/ML IJ SOLN
20.0000 mg | Freq: Once | INTRAMUSCULAR | Status: AC
  Administered 2020-11-16: 20 mg via INTRAVENOUS
  Filled 2020-11-16: qty 2

## 2020-11-16 MED ORDER — SENNOSIDES-DOCUSATE SODIUM 8.6-50 MG PO TABS
2.0000 | ORAL_TABLET | Freq: Two times a day (BID) | ORAL | Status: DC
  Administered 2020-11-16 – 2020-11-19 (×4): 2 via ORAL
  Filled 2020-11-16 (×6): qty 2

## 2020-11-16 MED ORDER — ASPIRIN EC 81 MG PO TBEC: 81.0000 mg | DELAYED_RELEASE_TABLET | Freq: Two times a day (BID) | ORAL | Status: DC

## 2020-11-16 MED ORDER — ASPIRIN EC 81 MG PO TBEC
81.0000 mg | DELAYED_RELEASE_TABLET | Freq: Two times a day (BID) | ORAL | Status: DC
  Administered 2020-11-18 – 2020-11-19 (×3): 81 mg via ORAL
  Filled 2020-11-16 (×3): qty 1

## 2020-11-16 MED ORDER — BISACODYL 10 MG RE SUPP
10.0000 mg | Freq: Every day | RECTAL | Status: DC
  Filled 2020-11-16: qty 1

## 2020-11-16 NOTE — Progress Notes (Signed)
Patient agitated this shift, attempted to climb out bed and yelling out. Patient continues pulling at cardiac monitor. MD Courage aware.

## 2020-11-16 NOTE — Plan of Care (Signed)
  Problem: Acute Rehab OT Goals (only OT should resolve) Goal: Pt. Will Perform Grooming Flowsheets (Taken 11/16/2020 1108) Pt Will Perform Grooming:  standing  with mod assist  with adaptive equipment Goal: Pt. Will Perform Lower Body Bathing Flowsheets (Taken 11/16/2020 1108) Pt Will Perform Lower Body Bathing:  with min assist  with adaptive equipment  sitting/lateral leans Goal: Pt. Will Perform Lower Body Dressing Flowsheets (Taken 11/16/2020 1108) Pt Will Perform Lower Body Dressing:  with min assist  with adaptive equipment  sitting/lateral leans Goal: Pt. Will Transfer To Toilet Flowsheets (Taken 11/16/2020 1108) Pt Will Transfer to Toilet:  with mod assist  stand pivot transfer  bedside commode Goal: Pt. Will Perform Tub/Shower Transfer Flowsheets (Taken 11/16/2020 1108) Pt Will Perform Tub/Shower Transfer:  with mod assist  Stand pivot transfer  shower seat Goal: Pt/Caregiver Will Perform Home Exercise Program Flowsheets (Taken 11/16/2020 1108) Pt/caregiver will Perform Home Exercise Program:  Increased strength  Both right and left upper extremity  With Supervision  Arely Tinner OT, MOT

## 2020-11-16 NOTE — Progress Notes (Addendum)
   ORTHOPAEDIC PROGRESS NOTE  Scheduled for Procedure(s): Cephalomedullary nail for right intertrochanteric femur fracture    DOS: 11/15/20  SUBJECTIVE: No issues over night.  Some confusion this morning.  She is wanting to get out of bed and is looking for her walker.  She is easily redirected.  Pain is controlled.  No issues with the dressings.  No family at bedside this morning.   OBJECTIVE: PE:  Some confusion, wants to get out of bed.  Tearful  Dressings over lateral hip are clean, dry and intact.  No strike through Mild swelling about the hip Tolerates gentle range of motion of the hip  Sensation to right foot intact 2+ DP pulse Active motion in EHL/TA  Vitals:   11/15/20 1942 11/16/20 0433  BP: (!) 111/40 138/65  Pulse: 62 83  Resp: 16 14  Temp: 97.9 F (36.6 C) 98.4 F (36.9 C)  SpO2: 96% 92%   CBC Latest Ref Rng & Units 11/16/2020 11/15/2020 11/15/2020  WBC 4.0 - 10.5 K/uL 10.3 - 8.5  Hemoglobin 12.0 - 15.0 g/dL 7.4(L) 8.6(L) 8.5(L)  Hematocrit 36.0 - 46.0 % 22.3(L) 26.3(L) 25.8(L)  Platelets 150 - 400 K/uL 218 - 264     ASSESSMENT: Laura Cook is a 87 y.o. female stable postop; Hb trending down since surgery.  Consider transfusion, especially if symptomatic.   PLAN: Weightbearing: WBAT RLE Insicional and dressing care: Reinforce dressings as needed Orthopedic device(s): None VTE prophylaxis: to resume POD#1; at discretion of medicine team.  Recommend 81 mg aspirin BID Pain control: PO pain medication PRN; judicious use of narcotics.  Follow - up plan: 2 weeks; staples can be removed in rehab if there are no issues.  Would plan to see patient 6 weeks postop.  IF there are any concerns, we can schedule a follow up appointment.    Contact information:     Vienna Folden A. Amedeo Kinsman, MD [place] [address] [place],  [place]  [address] Phone: [phone] Fax: [phone]

## 2020-11-16 NOTE — Progress Notes (Signed)
Patient Demographics:    Laura Cook, is a 87 y.o. female, DOB - 1922/06/10, YOV:[id number]  Admit date - 11/14/2020   Admitting Physician Cyntha Brickman Denton Brick, MD  Outpatient Primary MD for the patient is Monico Blitz, MD  LOS - 2   No chief complaint on file.       Subjective:    Porshe Bookwalter today has no fevers, no emesis,  No chest pain,   -Episodes of confusion and disorientation  Assessment  & Plan :    Principal Problem:   Rt Hip fracture /S/p Fall Active Problems:   HTN (hypertension)   Hyponatremia--HCTZ induced   GERD (gastroesophageal reflux disease)   HLD (hyperlipidemia)   Brief Summary:- 87 y.o. female (almost 87 yo) with Pmhx relevant for HTN, HLD, GERD who was admitted on 11/14/2020 after mechanical fall in her kitchen at home and right hip fracture   A/p  1)Rt Hip Fx--status post mechanical fall with right hip fracture -Orthopedic consult appreciated--p S/p  ORIF on 11/15/2020 -Further management per orthopedic team -Pain control and muscle relaxants as ordered   2)Hyponatremia/hypokalemia--- due to HCTZ use -Replace potassium, hydrate with IV fluids and stop HCTZ   3)HTN--stable, continue amlodipine, hold HCTZ due to #2 above  may use IV labetalol when necessary  Every 4 hours for systolic blood pressure over 170 mmhg   4)HLD--hold Crestor, given age of almost 80 years--doubt significant benefit down the road from statin therapy   5)GERD--Protonix as ordered   6) abnormal chest x-ray-----CXR with possible Lt sided rib fractures---may need CT chest to evaluate mild widening of the RIGHT paratracheal stripe  7) acute on chronic anemia due to acute blood loss anemia--due to hip fracture and ORIF -Hemoglobin is down to 7.4 from 11.3 on admission -Transfuse 1 unit of PRBC with Lasix  8) acute metabolic encephalopathy/confusion--- suspect opiate related, as needed  lorazepam and one-on-one sitter for safety -Check UA   Disposition/Need for in-Hospital Stay- patient unable to be discharged at this time due to -right hip fracture s/p  ORIF - -Awaiting transfer to SNF rehab   Status is: Inpatient   Remains inpatient appropriate because: see Disposition   Dispo: The patient is from: Home              Anticipated d/c is to: SNF              Anticipated d/c date is: 1 day              Patient currently is medically stable to d/c. Barriers: Not Clinically Stable-   Code Status :  -  Code Status: Full Code   Family Communication:      Discussed with son Iona Beard , son Awanda Mink and Jerome's wife --questions answered  Consults  :  ortho  DVT Prophylaxis  :   - SCDs   enoxaparin (LOVENOX) injection 40 mg Start: 11/16/20 0000 SCDs Start: 11/14/20 1613 Place TED hose Start: 11/14/20 1613    Lab Results  Component Value Date   PLT 218 11/16/2020    Inpatient Medications  Scheduled Meds:  amLODipine  5 mg Oral Daily   [START ON 11/18/2020] aspirin EC  81 mg Oral BID WC   Chlorhexidine Gluconate Cloth  6 each  Topical Daily   cycloSPORINE  1 drop Both Eyes BID   enoxaparin (LOVENOX) injection  40 mg Subcutaneous Q24H   methocarbamol  500 mg Oral TID   metoprolol tartrate  100 mg Oral Daily   pantoprazole  40 mg Oral Daily   polyethylene glycol  17 g Oral Daily   sodium chloride flush  3 mL Intravenous Q12H   sodium chloride flush  3 mL Intravenous Q12H   Continuous Infusions:  sodium chloride Stopped (11/16/20 1022)   sodium chloride     PRN Meds:.sodium chloride, acetaminophen **OR** acetaminophen, bisacodyl, fentaNYL (SUBLIMAZE) injection, labetalol, meclizine, ondansetron **OR** ondansetron (ZOFRAN) IV, oxyCODONE, sodium chloride flush, traZODone    Anti-infectives (From admission, onward)    Start     Dose/Rate Route Frequency Ordered Stop   11/15/20 1600  ceFAZolin (ANCEF) IVPB 2g/100 mL premix        2 g 200 mL/hr over 30 Minutes  Intravenous Every 8 hours 11/15/20 1150 11/16/20 0932   11/15/20 0600  ceFAZolin (ANCEF) IVPB 2g/100 mL premix        2 g 200 mL/hr over 30 Minutes Intravenous On call to O.R. 11/14/20 2058 11/15/20 0835         Objective:   Vitals:   11/16/20 1009 11/16/20 1027 11/16/20 1101 11/16/20 1332  BP: (!) 117/49 124/72 120/68 134/61  Pulse:  90 89 72  Resp:  18 18 18   Temp:  98 F (36.7 C) 98.2 F (36.8 C) 98.2 F (36.8 C)  TempSrc:  Oral Oral Oral  SpO2:  96% 95% 98%  Weight:      Height:        Wt Readings from Last 3 Encounters:  11/14/20 52.2 kg     Intake/Output Summary (Last 24 hours) at 11/16/2020 1819 Last data filed at 11/16/2020 1653 Gross per 24 hour  Intake 1757 ml  Output 1150 ml  Net 607 ml     Physical Exam  Physical Examination: General appearance -confused and disoriented Mental status -confusion and disorientation Eyes - sclera anicteric Neck - supple, no JVD elevation , Chest - clear  to auscultation bilaterally, symmetrical air movement,  Heart - S1 and S2 normal, regular  Abdomen - soft, nontender, nondistended, no masses or organomegaly Neurological - screening mental status exam normal, neck supple without rigidity, cranial nerves II through XII intact, DTR's normal and symmetric Extremities - no pedal edema noted, intact peripheral pulses  Skin - warm, dry MSK-postop wound intact, appropriate postop tenderness  Data Review:   Micro Results Recent Results (from the past 240 hour(s))  Resp Panel by RT-PCR (Flu A&B, Covid) Nasopharyngeal Swab     Status: None   Collection Time: 11/14/20 10:29 AM   Specimen: Nasopharyngeal Swab; Nasopharyngeal(NP) swabs in vial transport medium  Result Value Ref Range Status   SARS Coronavirus 2 by RT PCR NEGATIVE NEGATIVE Final    Comment: (NOTE) SARS-CoV-2 target nucleic acids are NOT DETECTED.  The SARS-CoV-2 RNA is generally detectable in upper respiratory specimens during the acute phase of infection.  The lowest concentration of SARS-CoV-2 viral copies this assay can detect is 138 copies/mL. A negative result does not preclude SARS-Cov-2 infection and should not be used as the sole basis for treatment or other patient management decisions. A negative result may occur with  improper specimen collection/handling, submission of specimen other than nasopharyngeal swab, presence of viral mutation(s) within the areas targeted by this assay, and inadequate number of viral copies(<138 copies/mL). A negative result  must be combined with clinical observations, patient history, and epidemiological information. The expected result is Negative.  Fact Sheet for Patients:  EntrepreneurPulse.com.au  Fact Sheet for Healthcare Providers:  IncredibleEmployment.be  This test is no t yet approved or cleared by the [place] FDA and  has been authorized for detection and/or diagnosis of SARS-CoV-2 by FDA under an Emergency Use Authorization (EUA). This EUA will remain  in effect (meaning this test can be used) for the duration of the COVID-19 declaration under Section 564(b)(1) of the Act, 21 U.S.C.section 360bbb-3(b)(1), unless the authorization is terminated  or revoked sooner.       Influenza A by PCR NEGATIVE NEGATIVE Final   Influenza B by PCR NEGATIVE NEGATIVE Final    Comment: (NOTE) The Xpert Xpress SARS-CoV-2/FLU/RSV plus assay is intended as an aid in the diagnosis of influenza from Nasopharyngeal swab specimens and should not be used as a sole basis for treatment. Nasal washings and aspirates are unacceptable for Xpert Xpress SARS-CoV-2/FLU/RSV testing.  Fact Sheet for Patients: EntrepreneurPulse.com.au  Fact Sheet for Healthcare Providers: IncredibleEmployment.be  This test is not yet approved or cleared by the [place] FDA and has been authorized for detection and/or diagnosis of SARS-CoV-2 by FDA  under an Emergency Use Authorization (EUA). This EUA will remain in effect (meaning this test can be used) for the duration of the COVID-19 declaration under Section 564(b)(1) of the Act, 21 U.S.C. section 360bbb-3(b)(1), unless the authorization is terminated or revoked.  Performed at [facility], [address] [place], [place] [address]     Radiology Reports DG Chest 1 View  Result Date: 11/14/2020 CLINICAL DATA:  pain EXAM: CHEST  1 VIEW COMPARISON:  None. FINDINGS: The cardiomediastinal silhouette is enlarged in contour. Prominence of the RIGHT paratracheal stripeatherosclerotic calcifications. Biapical scarring. No pleural effusion. No pneumothorax. No acute pleuroparenchymal abnormality. Visualized abdomen is unremarkable. Age-indeterminate compression fracture of the midthoracic vertebral body; recommend correlation with point tenderness. Lucency and mild cortical irregularity of the LEFT lateral fifth and sixth rib. Remote LEFT posterior sixth and seventh rib fractures. IMPRESSION: 1. No focal consolidation. 2. Mild widening of the RIGHT paratracheal stripe, possibly summation from overlapping vascular structures. Consider further dedicated evaluation with PA and lateral radiograph versus nonemergent chest CT if clinically indicated. 3. Age-indeterminate compression fracture of a midthoracic vertebral body; recommend correlation with point tenderness 4. Subtle lucency traversing several LEFT-sided ribs may reflect nondisplaced rib fractures in the appropriate clinical setting. Correlate with point tenderness Electronically Signed   By: Valentino Saxon M.D.   On: 11/14/2020 10:25   DG HIP OPERATIVE UNILAT WITH PELVIS RIGHT  Result Date: 11/15/2020 CLINICAL DATA:  Hip fracture require operative repair. EXAM: OPERATIVE RIGHT HIP (WITH PELVIS IF PERFORMED) TECHNIQUE: Fluoroscopic spot image(s) were submitted for interpretation post-operatively. FLUOROSCOPY TIME:  Fluoroscopy Time: 1 minute  44 seconds Radiation Exposure Index: 20.4 mGy COMPARISON:  Right hip radiographs 11/14/2020 FINDINGS: Six intraoperative fluoroscopic images are submitted and demonstrate ORIF of the intertrochanteric right femur fracture with placement of an intramedullary nail, hip screw, and more distal interlocking screw. Alignment is grossly anatomic on these limited fluoroscopic images. IMPRESSION: Intraoperative images during ORIF of a right femur fracture. Electronically Signed   By: Logan Bores M.D.   On: 11/15/2020 11:29   DG Hip Unilat With Pelvis 2-3 Views Right  Result Date: 11/14/2020 CLINICAL DATA:  pain EXAM: DG HIP (WITH PELVIS) 3V RIGHT COMPARISON:  None. FINDINGS: Osteopenia. There is a comminuted intertrochanteric fracture of the RIGHT  proximal femur. There is minimal displacement of the fracture fragments. Femoral head appears seated within the acetabulum. Severe vascular calcifications. Pelvic phleboliths. Degenerative changes of the lower lumbar spine. Status post ORIF of the LEFT femur. Mild cortical thickening of the LEFT superior pubic ramus likely reflecting sequela of remote prior fracture. Limited assessment of the sacrum secondary to overlying bowel contents and osteopenia. IMPRESSION: Comminuted intertrochanteric fracture of the RIGHT proximal femur. Electronically Signed   By: Valentino Saxon M.D.   On: 11/14/2020 10:18   DG FEMUR PORT, MIN 2 VIEWS RIGHT  Result Date: 11/15/2020 CLINICAL DATA:  Status post internal fixation of right femur fracture. EXAM: RIGHT FEMUR PORTABLE 2 VIEW COMPARISON:  Intraoperative fluoroscopic images earlier today. Right hip radiographs 11/14/2020. FINDINGS: There has been ORIF of the previously described right intertrochanteric femur fracture with placement of an intramedullary nail and hip screw. Alignment is grossly anatomic. The right hip and knee are located. Skin staples and postoperative soft tissue gas are noted. There is atherosclerotic vascular  calcification. IMPRESSION: ORIF of intertrochanteric right femur fracture. Electronically Signed   By: Logan Bores M.D.   On: 11/15/2020 11:26     CBC Recent Labs  Lab 11/14/20 0942 11/15/20 0523 11/15/20 1029 11/16/20 0536  WBC 5.9 8.5  --  10.3  HGB 11.3* 8.5* 8.6* 7.4*  HCT 33.5* 25.8* 26.3* 22.3*  PLT 325 264  --  218  MCV 87.0 89.0  --  89.9  MCH 29.4 29.3  --  29.8  MCHC 33.7 32.9  --  33.2  RDW 13.3 13.2  --  13.5  LYMPHSABS 1.2  --   --   --   MONOABS 0.5  --   --   --   EOSABS 0.1  --   --   --   BASOSABS 0.1  --   --   --     Chemistries  Recent Labs  Lab 11/14/20 0942 11/15/20 0523 11/16/20 0536  NA 127* 127* 129*  K 3.2* 4.4 3.4*  CL 91* 98 101  CO2 27 24 21*  GLUCOSE 116* 112* 111*  BUN 16 22 11   CREATININE 0.51 0.36* 0.40*  CALCIUM 9.1 8.3* 7.9*  MG 1.8  --   --   AST 13*  --   --   ALT 9  --   --   ALKPHOS 51  --   --   BILITOT 0.8  --   --    ------------------------------------------------------------------------------------------------------------------ No results for input(s): CHOL, HDL, LDLCALC, TRIG, CHOLHDL, LDLDIRECT in the last 72 hours.  No results found for: HGBA1C ------------------------------------------------------------------------------------------------------------------ No results for input(s): TSH, T4TOTAL, T3FREE, THYROIDAB in the last 72 hours.  Invalid input(s): FREET3 ------------------------------------------------------------------------------------------------------------------ No results for input(s): VITAMINB12, FOLATE, FERRITIN, TIBC, IRON, RETICCTPCT in the last 72 hours.  Coagulation profile No results for input(s): INR, PROTIME in the last 168 hours.  No results for input(s): DDIMER in the last 72 hours.  Cardiac Enzymes No results for input(s): CKMB, TROPONINI, MYOGLOBIN in the last 168 hours.  Invalid input(s):  CK ------------------------------------------------------------------------------------------------------------------ No results found for: BNP  .Roxan Hockey M.D on 11/16/2020 at 6:19 PM  Go to [email or web address] - for contact info  [facility] - Office  [phone]

## 2020-11-16 NOTE — Plan of Care (Signed)
  Problem: Acute Rehab PT Goals(only PT should resolve) Goal: Pt Will Go Supine/Side To Sit Outcome: Progressing Flowsheets (Taken 11/16/2020 1404) Pt will go Supine/Side to Sit: with minimal assist Goal: Patient Will Transfer Sit To/From Stand Outcome: Progressing Flowsheets (Taken 11/16/2020 1404) Patient will transfer sit to/from stand: with minimal assist Goal: Pt Will Transfer Bed To Chair/Chair To Bed Outcome: Progressing Flowsheets (Taken 11/16/2020 1404) Pt will Transfer Bed to Chair/Chair to Bed: with min assist Goal: Pt Will Ambulate Outcome: Progressing Flowsheets (Taken 11/16/2020 1404) Pt will Ambulate:  25 feet  with moderate assist  with rolling walker   2:05 PM, 11/16/20 Lonell Grandchild, MPT Physical Therapist with [facility] [phone] office [phone] mobile phone

## 2020-11-16 NOTE — Progress Notes (Signed)
Chaplain engaged in an initial visit with Laura Cook and her son Laura Cook.  Chaplain learned about what brought Laura Cook into the hospital.  She voiced falling on her hip and the repairs that have been made while she is in the hospital.  Laura Cook and Laura Cook also talked about their home which was built in the 1930's and the ways it doesn't accommodate those with handicaps or physical limitations.  They've had to add on to the house and change the way furniture is positioned for Laura Cook walker.    Laura Cook also shared that she would be turning 89 soon.  Chaplain celebrated with her.  Laura Cook voiced that she does feel 89 especially with her hips.  Laura Cook and Laura Cook also began talking about their family tree and the connection Laura Cook grandpa had to a number of properties in [place].    Chaplain offered support, listening, and presence.  Chaplain is available to follow-up as needed.    11/16/20 0900  Clinical Encounter Type  Visited With Patient and family together  Visit Type Initial

## 2020-11-16 NOTE — Evaluation (Signed)
Occupational Therapy Evaluation Patient Details Name: Laura Cook MRN: [id number] DOB: 10-May-1922 Today's Date: 11/16/2020   History of Present Illness Laura Cook is a 87 y.o. female who had a mechanical fall and sustained a Right intertrochanteric femur fracture. Post op cephalomedullary nail for right intertrochanteric femur fracture. Pt WBAT R LE.   Clinical Impression   Pt agreeable to OT/PT co-evaluation. Pt demonstrates general B UE weakness with mild limitations in L UE active and passive shoulder ROM. Pt required mod to max A for supine to sit bed mobility and mod A for sit to supine. Pt required mod A for sit to stand and mod to max A for side steps using RW beside bed. Pt demonstrates fear of falling when prompted to take side steps to head of bed. Pt left in bed with call bell and phone within reach, family present, and bed alarm on. Pt will benefit from continued OT in the hospital and recommended venue below to increase strength, balance, and endurance for safe ADL's.        Recommendations for follow up therapy are one component of a multi-disciplinary discharge planning process, led by the attending physician.  Recommendations may be updated based on patient status, additional functional criteria and insurance authorization.   Follow Up Recommendations  SNF    Equipment Recommendations  None recommended by OT           Precautions / Restrictions Precautions Precautions: Fall Restrictions Weight Bearing Restrictions: Yes RLE Weight Bearing: Weight bearing as tolerated      Mobility Bed Mobility Overal bed mobility: Needs Assistance Bed Mobility: Supine to Sit;Sit to Supine     Supine to sit: Mod assist;Max assist Sit to supine: Mod assist   General bed mobility comments: slow labored movment with verbal cuing and assist needed.    Transfers Overall transfer level: Needs assistance Equipment used: Rolling walker (2 wheeled) Transfers: Sit  to/from Stand Sit to Stand: Mod assist         General transfer comment: Pt requires mod A for sit to stand at EOB with RW. Pt demosntrates fear of falling when prompted to take side steps at EOB.    Balance Overall balance assessment: Needs assistance Sitting-balance support: No upper extremity supported;Feet supported Sitting balance-Leahy Scale: Fair Sitting balance - Comments: fair to good   Standing balance support: Bilateral upper extremity supported;During functional activity Standing balance-Leahy Scale: Poor Standing balance comment: using RW                           ADL either performed or assessed with clinical judgement   ADL Overall ADL's : Needs assistance/impaired                         Toilet Transfer: Moderate assistance;Maximal assistance;RW;Stand-pivot Toilet Transfer Details (indicate cue type and reason): Partially simulated via sit to stand from EOB. Pt likely requires mod to max A for stand pivot transfer to toilet.           General ADL Comments: Pt demonstraes fear of standing from EOB. Pt likely requires ADL's completed in sitting at this time.     Vision Baseline Vision/History: 1 Wears glasses Ability to See in Adequate Light: 0 Adequate Patient Visual Report: No change from baseline Vision Assessment?: No apparent visual deficits (outside baseline impairment)     Perception     Praxis      Pertinent  Vitals/Pain Pain Assessment: Faces Faces Pain Scale: Hurts even more Pain Location: R hip Pain Descriptors / Indicators: Grimacing;Moaning Pain Intervention(s): Limited activity within patient's tolerance;Monitored during session;Premedicated before session;Repositioned     Hand Dominance Right   Extremity/Trunk Assessment Upper Extremity Assessment Upper Extremity Assessment: RUE deficits/detail;LUE deficits/detail RUE Deficits / Details: R WFL P/ROM and A/ROM; general weakness RUE Coordination: WNL LUE  Deficits / Details: Pt demonstrates ~75% of available range for shoulder flexion P/ROM and A/ROM while supine. General weakness elbow, wrist, and grip. LUE Coordination: WNL   Lower Extremity Assessment Lower Extremity Assessment: Defer to PT evaluation   Cervical / Trunk Assessment Cervical / Trunk Assessment: Kyphotic   Communication Communication Communication: No difficulties   Cognition Arousal/Alertness: Awake/alert Behavior During Therapy: WFL for tasks assessed/performed Overall Cognitive Status: Within Functional Limits for tasks assessed                                                      Home Living Family/patient expects to be discharged to:: Private residence Living Arrangements: Children Available Help at Discharge: Family;Available 24 hours/day Type of Home: House Home Access: Ramped entrance     Home Layout: One level     Bathroom Shower/Tub: Teacher, early years/pre: Handicapped height Bathroom Accessibility: No   Home Equipment: Environmental consultant - 4 wheels;Walker - 2 wheels;Bedside commode          Prior Functioning/Environment Level of Independence: Needs assistance  Gait / Transfers Assistance Needed: household ambulator with RW or rollator ADL's / Homemaking Assistance Needed: Pt reports independence for ADL's and assited by family for IADL's.            OT Problem List: Decreased strength;Decreased range of motion;Decreased activity tolerance;Impaired balance (sitting and/or standing)      OT Treatment/Interventions: Self-care/ADL training;Therapeutic exercise;Patient/family education;Balance training;Therapeutic activities;DME and/or AE instruction    OT Goals(Current goals can be found in the care plan section) Acute Rehab OT Goals Patient Stated Goal: return home OT Goal Formulation: With patient Time For Goal Achievement: 11/30/20 Potential to Achieve Goals: Fair  OT Frequency: Min 2X/week                Co-evaluation PT/OT/SLP Co-Evaluation/Treatment: Yes Reason for Co-Treatment: Complexity of the patient's impairments (multi-system involvement)   OT goals addressed during session: ADL's and self-care;Strengthening/ROM                       End of Session Equipment Utilized During Treatment: Rolling walker  Activity Tolerance: Patient tolerated treatment well Patient left: in bed;with call bell/phone within reach;with bed alarm set;with family/visitor present  OT Visit Diagnosis: Unsteadiness on feet (R26.81);History of falling (Z91.81);Muscle weakness (generalized) (M62.81)                Time: 5993-5701 OT Time Calculation (min): 20 min Charges:  OT General Charges $OT Visit: 1 Visit OT Evaluation $OT Eval Moderate Complexity: 1 Mod  Bryleigh Ottaway OT, MOT  Larey Seat 11/16/2020, 11:05 AM

## 2020-11-16 NOTE — Evaluation (Signed)
Physical Therapy Evaluation Patient Details Name: Laura Cook MRN: [id number] DOB: Sep 04, 1922 Today's Date: 11/16/2020  History of Present Illness  Marieclaire R Wysocki is a 87 y.o. female who had a mechanical fall and sustained a Right intertrochanteric femur fracture. Post op cephalomedullary nail for right intertrochanteric femur fracture. Pt WBAT R LE.   Clinical Impression  Patient demonstrates slow labored movement for sitting up at bedside requiring Mod assist to move RLE and propping up onto elbows to hands, poor tolerance for standing due to c/o severe right hip pain and limited to a few shuffling side steps before having to sit due to fatigue/pain.  Patient put back to bed after therapy due to low HGB with plan to receive blood transfusion.  Patient will benefit from continued physical therapy in hospital and recommended venue below to increase strength, balance, endurance for safe ADLs and gait.         Recommendations for follow up therapy are one component of a multi-disciplinary discharge planning process, led by the attending physician.  Recommendations may be updated based on patient status, additional functional criteria and insurance authorization.  Follow Up Recommendations SNF    Equipment Recommendations  None recommended by PT    Recommendations for Other Services       Precautions / Restrictions Precautions Precautions: Fall Restrictions Weight Bearing Restrictions: Yes RLE Weight Bearing: Weight bearing as tolerated      Mobility  Bed Mobility Overal bed mobility: Needs Assistance Bed Mobility: Supine to Sit;Sit to Supine     Supine to sit: Mod assist;Max assist Sit to supine: Mod assist   General bed mobility comments: as per OT notes    Transfers Overall transfer level: Needs assistance Equipment used: Rolling walker (2 wheeled) Transfers: Sit to/from Stand Sit to Stand: Mod assist         General transfer comment: as per OT  notes  Ambulation/Gait Ambulation/Gait assistance: Max assist Gait Distance (Feet): 3 Feet Assistive device: Rolling walker (2 wheeled) Gait Pattern/deviations: Decreased step length - right;Decreased stance time - right;Decreased step length - left;Decreased stride length;Antalgic;Shuffle Gait velocity: decreased   General Gait Details: Patient limited to a few shuffling side steps requring Max tactile assistance to move RLE due to increased pain and weakness  Stairs            Wheelchair Mobility    Modified Rankin (Stroke Patients Only)       Balance Overall balance assessment: Needs assistance Sitting-balance support: Feet supported;No upper extremity supported Sitting balance-Leahy Scale: Fair Sitting balance - Comments: fair/good seated at EOB   Standing balance support: During functional activity;Bilateral upper extremity supported Standing balance-Leahy Scale: Poor Standing balance comment: using RW                             Pertinent Vitals/Pain Pain Assessment: Faces Faces Pain Scale: Hurts even more Pain Location: R hip Pain Descriptors / Indicators: Grimacing;Moaning;Sore Pain Intervention(s): Limited activity within patient's tolerance;Monitored during session;Premedicated before session;Repositioned    Home Living Family/patient expects to be discharged to:: Private residence Living Arrangements: Children Available Help at Discharge: Family;Available 24 hours/day Type of Home: House Home Access: Ramped entrance     Home Layout: One level Home Equipment: Walker - 4 wheels;Walker - 2 wheels;Bedside commode      Prior Function Level of Independence: Needs assistance   Gait / Transfers Assistance Needed: household ambulator with RW or rollator  ADL's / [employer]  Needed: Pt reports independence for ADL's and assited by family for IADL's.        Hand Dominance   Dominant Hand: Right    Extremity/Trunk Assessment    Upper Extremity Assessment Upper Extremity Assessment: Defer to OT evaluation RUE Deficits / Details: R WFL P/ROM and A/ROM; general weakness RUE Coordination: WNL LUE Deficits / Details: Pt demonstrates ~75% of available range for shoulder flexion P/ROM and A/ROM while supine. General weakness elbow, wrist, and grip. LUE Coordination: WNL    Lower Extremity Assessment Lower Extremity Assessment: Generalized weakness;RLE deficits/detail RLE Deficits / Details: grossly 3-/5 RLE: Unable to fully assess due to pain RLE Sensation: WNL RLE Coordination: WNL    Cervical / Trunk Assessment Cervical / Trunk Assessment: Kyphotic  Communication   Communication: No difficulties  Cognition Arousal/Alertness: Awake/alert Behavior During Therapy: WFL for tasks assessed/performed Overall Cognitive Status: Within Functional Limits for tasks assessed                                        General Comments      Exercises     Assessment/Plan    PT Assessment Patient needs continued PT services  PT Problem List Decreased strength;Decreased activity tolerance;Decreased balance;Decreased mobility       PT Treatment Interventions DME instruction;Functional mobility training;Therapeutic activities;Stair training;Gait training;Balance training;Patient/family education;Therapeutic exercise    PT Goals (Current goals can be found in the Care Plan section)  Acute Rehab PT Goals Patient Stated Goal: return home after rehab PT Goal Formulation: With patient/family Time For Goal Achievement: 11/30/20 Potential to Achieve Goals: Good    Frequency Min 4X/week   Barriers to discharge        Co-evaluation PT/OT/SLP Co-Evaluation/Treatment: Yes Reason for Co-Treatment: Complexity of the patient's impairments (multi-system involvement);To address functional/ADL transfers PT goals addressed during session: Mobility/safety with mobility;Balance;Proper use of DME OT goals addressed  during session: ADL's and self-care;Strengthening/ROM       AM-PAC PT "6 Clicks" Mobility  Outcome Measure Help needed turning from your back to your side while in a flat bed without using bedrails?: A Lot Help needed moving from lying on your back to sitting on the side of a flat bed without using bedrails?: A Lot Help needed moving to and from a bed to a chair (including a wheelchair)?: A Lot Help needed standing up from a chair using your arms (e.g., wheelchair or bedside chair)?: A Lot Help needed to walk in hospital room?: Total Help needed climbing 3-5 steps with a railing? : Total 6 Click Score: 10    End of Session   Activity Tolerance: Patient tolerated treatment well;Patient limited by fatigue;Patient limited by pain Patient left: in bed;with call bell/phone within reach;with family/visitor present Nurse Communication: Mobility status PT Visit Diagnosis: Unsteadiness on feet (R26.81);Other abnormalities of gait and mobility (R26.89);Muscle weakness (generalized) (M62.81)    Time: 3825-0539 PT Time Calculation (min) (ACUTE ONLY): 20 min   Charges:   PT Evaluation $PT Eval Moderate Complexity: 1 Mod PT Treatments $Therapeutic Activity: 8-22 mins        2:02 PM, 11/16/20 Lonell Grandchild, MPT Physical Therapist with [facility] [phone] office [phone] mobile phone

## 2020-11-17 DIAGNOSIS — W19XXXA Unspecified fall, initial encounter: Secondary | ICD-10-CM

## 2020-11-17 DIAGNOSIS — E7800 Pure hypercholesterolemia, unspecified: Secondary | ICD-10-CM

## 2020-11-17 DIAGNOSIS — E871 Hypo-osmolality and hyponatremia: Secondary | ICD-10-CM

## 2020-11-17 DIAGNOSIS — E876 Hypokalemia: Secondary | ICD-10-CM

## 2020-11-17 DIAGNOSIS — S72141A Displaced intertrochanteric fracture of right femur, initial encounter for closed fracture: Principal | ICD-10-CM

## 2020-11-17 DIAGNOSIS — I10 Essential (primary) hypertension: Secondary | ICD-10-CM

## 2020-11-17 LAB — CBC
HCT: 28.8 % — ABNORMAL LOW (ref 36.0–46.0)
Hemoglobin: 9.7 g/dL — ABNORMAL LOW (ref 12.0–15.0)
MCH: 28.9 pg (ref 26.0–34.0)
MCHC: 33.7 g/dL (ref 30.0–36.0)
MCV: 85.7 fL (ref 80.0–100.0)
Platelets: 216 10*3/uL (ref 150–400)
RBC: 3.36 MIL/uL — ABNORMAL LOW (ref 3.87–5.11)
RDW: 13.2 % (ref 11.5–15.5)
WBC: 11.1 10*3/uL — ABNORMAL HIGH (ref 4.0–10.5)
nRBC: 0 % (ref 0.0–0.2)

## 2020-11-17 LAB — TYPE AND SCREEN
ABO/RH(D): O POS
Antibody Screen: NEGATIVE
Unit division: 0

## 2020-11-17 LAB — BASIC METABOLIC PANEL
Anion gap: 8 (ref 5–15)
BUN: 8 mg/dL (ref 8–23)
CO2: 24 mmol/L (ref 22–32)
Calcium: 8.2 mg/dL — ABNORMAL LOW (ref 8.9–10.3)
Chloride: 95 mmol/L — ABNORMAL LOW (ref 98–111)
Creatinine, Ser: 0.35 mg/dL — ABNORMAL LOW (ref 0.44–1.00)
GFR, Estimated: >60 mL/min (ref >60)
Glucose, Bld: 111 mg/dL — ABNORMAL HIGH (ref 70–99)
Potassium: 3.2 mmol/L — ABNORMAL LOW (ref 3.5–5.1)
Sodium: 127 mmol/L — ABNORMAL LOW (ref 135–145)

## 2020-11-17 LAB — BPAM RBC
Blood Product Expiration Date: 202210302359
ISSUE DATE / TIME: 202209271035
Unit Type and Rh: 5100

## 2020-11-17 LAB — POTASSIUM: Potassium: 3.8 mmol/L (ref 3.5–5.1)

## 2020-11-17 LAB — MAGNESIUM: Magnesium: 1.4 mg/dL — ABNORMAL LOW (ref 1.7–2.4)

## 2020-11-17 MED ORDER — BISACODYL 10 MG RE SUPP
10.0000 mg | Freq: Every day | RECTAL | Status: DC
  Administered 2020-11-18 – 2020-11-19 (×2): 10 mg via RECTAL
  Filled 2020-11-17 (×3): qty 1

## 2020-11-17 MED ORDER — POTASSIUM CHLORIDE 10 MEQ/100ML IV SOLN
10.0000 meq | INTRAVENOUS | Status: AC
  Administered 2020-11-17 (×5): 10 meq via INTRAVENOUS
  Filled 2020-11-17 (×5): qty 100

## 2020-11-17 MED ORDER — HALOPERIDOL LACTATE 5 MG/ML IJ SOLN
1.0000 mg | Freq: Four times a day (QID) | INTRAMUSCULAR | Status: DC | PRN
Start: 2020-11-17 — End: 2020-11-19

## 2020-11-17 NOTE — Progress Notes (Signed)
   ORTHOPAEDIC PROGRESS NOTE  Scheduled for Procedure(s): Cephalomedullary nail for right intertrochanteric femur fracture    DOS: 11/15/20  SUBJECTIVE: Confusion over night.  Improved following medication.  Resting well this morning.  Received 1 U PRBC yesterday with a good response.   OBJECTIVE: PE:  Sleepy, but arousable.  Pain is controlled.   Dressings over lateral hip are clean, dry and intact.  No strike through Mild swelling about the hip Tolerates gentle range of motion of the hip  Sensation to right foot intact 2+ DP pulse Active motion in EHL/TA  Vitals:   11/16/20 2011 11/17/20 0639  BP: (!) 144/53 135/76  Pulse: 79 90  Resp: 20 15  Temp: 98.7 F (37.1 C) 97.8 F (36.6 C)  SpO2: 91% 96%   CBC Latest Ref Rng & Units 11/17/2020 11/16/2020 11/15/2020  WBC 4.0 - 10.5 K/uL 11.1(H) 10.3 -  Hemoglobin 12.0 - 15.0 g/dL 9.7(L) 7.4(L) 8.6(L)  Hematocrit 36.0 - 46.0 % 28.8(L) 22.3(L) 26.3(L)  Platelets 150 - 400 K/uL 216 218 -     ASSESSMENT: Laura Cook is a 87 y.o. female stable postop; Confusion improved with medication.  Hb with good response following transfusion.   PLAN: Weightbearing: WBAT RLE Insicional and dressing care: Reinforce dressings as needed Orthopedic device(s): None VTE prophylaxis: to resume POD#1; at discretion of medicine team.  Recommend 81 mg aspirin BID Pain control: PO pain medication PRN; judicious use of narcotics.  Follow - up plan: 2 weeks; staples can be removed in rehab if there are no issues.  Would plan to see patient 6 weeks postop.  IF there are any concerns, we can schedule a follow up appointment.    Contact information:     Marlan Steward A. Amedeo Kinsman, MD [facility] [address] [place],  [place]  [address] Phone: [phone] Fax: [phone]

## 2020-11-17 NOTE — Progress Notes (Signed)
PROGRESS NOTE    Laura Cook  QQV:[id number] DOB: 04-10-1922 DOA: 11/14/2020 PCP: Monico Blitz, MD   Brief Narrative:  87 y.o. WF PMHx HTN, HLD, GERD who was admitted on 11/14/2020 after mechanical fall in her kitchen at home and right hip fracture    Subjective: Afebrile overnight asleep not arousable.   Assessment & Plan:  Covid vaccination;  Principal Problem:   Rt Hip fracture /S/p Fall Active Problems:   HTN (hypertension)   Hyponatremia--HCTZ induced   GERD (gastroesophageal reflux disease)   HLD (hyperlipidemia)   Rt Hip Fx--status post mechanical fall with right hip fracture -Orthopedic consult appreciated--p S/p  ORIF on 11/15/2020 -Further management per orthopedic team -Pain control and muscle relaxants as ordered   2)Hyponatremia/hypokalemia--- due to HCTZ use -Replace potassium, hydrate with IV fluids and stop HCTZ   3)HTN--stable, continue amlodipine, hold HCTZ due to #2 above  may use IV labetalol when necessary  Every 4 hours for systolic blood pressure over 170 mmhg   4)HLD--hold Crestor, given age of almost 12 years--doubt significant benefit down the road from statin therapy   5)GERD--Protonix as ordered   6) abnormal chest x-ray-----CXR with possible Lt sided rib fractures---may need CT chest to evaluate mild widening of the RIGHT paratracheal stripe   7) acute on chronic anemia due to acute blood loss anemia--due to hip fracture and ORIF -Hemoglobin is down to 7.4 from 11.3 on admission -Transfuse 1 unit of PRBC with Lasix   Acute metabolic encephalopathy/agitation - 9/28 DC Ativan - 9/28 Haldol PRN agitation   --- suspect opiate related, as needed lorazepam and one-on-one sitter for safety -Check UA  Hyponatremia  Hypokalemia - Potassium goal> 4 - 9/28 potassium IV 50 mEq -9/20 8 repeat K/Mg @ 1600     Disposition/Need for in-Hospital Stay- patient unable to be discharged at this time due to -right hip fracture s/p  ORIF  - -Awaiting transfer to SNF rehab     DVT prophylaxis: Lovenox Code Status: Full Family Communication: 9/28 son present at bedside for discussion plan of care all questions answered Status is: Inpatient    Dispo: The patient is from:               Anticipated d/c is to:               Anticipated d/c date is:               Patient currently       Consultants:    Procedures/Significant Events:  9/26 S/p  ORIF    I have personally reviewed and interpreted all radiology studies and my findings are as above.  VENTILATOR SETTINGS:    Cultures   Antimicrobials: Anti-infectives (From admission, onward)    Start     Dose/Rate Route Frequency Ordered Stop   11/15/20 1600  ceFAZolin (ANCEF) IVPB 2g/100 mL premix        2 g 200 mL/hr over 30 Minutes Intravenous Every 8 hours 11/15/20 1150 11/16/20 0932   11/15/20 0600  ceFAZolin (ANCEF) IVPB 2g/100 mL premix        2 g 200 mL/hr over 30 Minutes Intravenous On call to O.R. 11/14/20 2058 11/15/20 0835         Devices    LINES / TUBES:      Continuous Infusions:  sodium chloride Stopped (11/16/20 2356)   sodium chloride       Objective: Vitals:   11/16/20 1101 11/16/20 1332 11/16/20 2011 11/17/20 3329  BP: 120/68 134/61 (!) 144/53 135/76  Pulse: 89 72 79 90  Resp: 18 18 20 15   Temp: 98.2 F (36.8 C) 98.2 F (36.8 C) 98.7 F (37.1 C) 97.8 F (36.6 C)  TempSrc: Oral Oral Oral Oral  SpO2: 95% 98% 91% 96%  Weight:      Height:        Intake/Output Summary (Last 24 hours) at 11/17/2020 6144 Last data filed at 11/17/2020 3154 Gross per 24 hour  Intake 1390.33 ml  Output 1200 ml  Net 190.33 ml   Filed Weights   11/14/20 0924  Weight: 52.2 kg    Examination:  General: No acute respiratory distress Eyes: negative scleral hemorrhage, negative anisocoria, negative icterus ENT: Negative Runny nose, negative gingival bleeding, Neck:  Negative scars, masses, torticollis, lymphadenopathy,  JVD Lungs: Clear to auscultation bilaterally without wheezes or crackles Cardiovascular: Regular rate and rhythm without murmur gallop or rub normal S1 and S2 Abdomen: negative abdominal pain, nondistended, positive soft, bowel sounds, no rebound, no ascites, no appreciable mass Extremities: RIGHT hip incision sites bandaged no sign of infection Skin: Negative rashes, lesions, ulcers Psychiatric: Unable to evaluate Central nervous system: Spontaneously moves all extremities  .     Data Reviewed: Care during the described time interval was provided by me .  I have reviewed this patient's available data, including medical history, events of note, physical examination, and all test results as part of my evaluation.   CBC: Recent Labs  Lab 11/14/20 0942 11/15/20 0523 11/15/20 1029 11/16/20 0536 11/17/20 0519  WBC 5.9 8.5  --  10.3 11.1*  NEUTROABS 4.1  --   --   --   --   HGB 11.3* 8.5* 8.6* 7.4* 9.7*  HCT 33.5* 25.8* 26.3* 22.3* 28.8*  MCV 87.0 89.0  --  89.9 85.7  PLT 325 264  --  218 008   Basic Metabolic Panel: Recent Labs  Lab 11/14/20 0942 11/15/20 0523 11/16/20 0536 11/17/20 0519  NA 127* 127* 129* 127*  K 3.2* 4.4 3.4* 3.2*  CL 91* 98 101 95*  CO2 27 24 21* 24  GLUCOSE 116* 112* 111* 111*  BUN 16 22 11 8   CREATININE 0.51 0.36* 0.40* 0.35*  CALCIUM 9.1 8.3* 7.9* 8.2*  MG 1.8  --   --   --    GFR: Estimated Creatinine Clearance: 34.7 mL/min (A) (by C-G formula based on SCr of 0.35 mg/dL (L)). Liver Function Tests: Recent Labs  Lab 11/14/20 0942  AST 13*  ALT 9  ALKPHOS 51  BILITOT 0.8  PROT 6.4*  ALBUMIN 4.0   No results for input(s): LIPASE, AMYLASE in the last 168 hours. No results for input(s): AMMONIA in the last 168 hours. Coagulation Profile: No results for input(s): INR, PROTIME in the last 168 hours. Cardiac Enzymes: No results for input(s): CKTOTAL, CKMB, CKMBINDEX, TROPONINI in the last 168 hours. BNP (last 3 results) No results for  input(s): PROBNP in the last 8760 hours. HbA1C: No results for input(s): HGBA1C in the last 72 hours. CBG: Recent Labs  Lab 11/14/20 2139  GLUCAP 165*   Lipid Profile: No results for input(s): CHOL, HDL, LDLCALC, TRIG, CHOLHDL, LDLDIRECT in the last 72 hours. Thyroid Function Tests: No results for input(s): TSH, T4TOTAL, FREET4, T3FREE, THYROIDAB in the last 72 hours. Anemia Panel: No results for input(s): VITAMINB12, FOLATE, FERRITIN, TIBC, IRON, RETICCTPCT in the last 72 hours. Urine analysis:    Component Value Date/Time   COLORURINE YELLOW 11/16/2020 1930  APPEARANCEUR CLEAR 11/16/2020 1930   LABSPEC 1.011 11/16/2020 1930   PHURINE 5.0 11/16/2020 1930   GLUCOSEU NEGATIVE 11/16/2020 1930   HGBUR MODERATE (A) 11/16/2020 1930   BILIRUBINUR NEGATIVE 11/16/2020 1930   KETONESUR NEGATIVE 11/16/2020 1930   PROTEINUR NEGATIVE 11/16/2020 1930   NITRITE NEGATIVE 11/16/2020 1930   LEUKOCYTESUR NEGATIVE 11/16/2020 1930   Sepsis Labs: @LABRCNTIP (procalcitonin:4,lacticidven:4)  ) Recent Results (from the past 240 hour(s))  Resp Panel by RT-PCR (Flu A&B, Covid) Nasopharyngeal Swab     Status: None   Collection Time: 11/14/20 10:29 AM   Specimen: Nasopharyngeal Swab; Nasopharyngeal(NP) swabs in vial transport medium  Result Value Ref Range Status   SARS Coronavirus 2 by RT PCR NEGATIVE NEGATIVE Final    Comment: (NOTE) SARS-CoV-2 target nucleic acids are NOT DETECTED.  The SARS-CoV-2 RNA is generally detectable in upper respiratory specimens during the acute phase of infection. The lowest concentration of SARS-CoV-2 viral copies this assay can detect is 138 copies/mL. A negative result does not preclude SARS-Cov-2 infection and should not be used as the sole basis for treatment or other patient management decisions. A negative result may occur with  improper specimen collection/handling, submission of specimen other than nasopharyngeal swab, presence of viral mutation(s) within  the areas targeted by this assay, and inadequate number of viral copies(<138 copies/mL). A negative result must be combined with clinical observations, patient history, and epidemiological information. The expected result is Negative.  Fact Sheet for Patients:  EntrepreneurPulse.com.au  Fact Sheet for Healthcare Providers:  IncredibleEmployment.be  This test is no t yet approved or cleared by the [place] FDA and  has been authorized for detection and/or diagnosis of SARS-CoV-2 by FDA under an Emergency Use Authorization (EUA). This EUA will remain  in effect (meaning this test can be used) for the duration of the COVID-19 declaration under Section 564(b)(1) of the Act, 21 U.S.C.section 360bbb-3(b)(1), unless the authorization is terminated  or revoked sooner.       Influenza A by PCR NEGATIVE NEGATIVE Final   Influenza B by PCR NEGATIVE NEGATIVE Final    Comment: (NOTE) The Xpert Xpress SARS-CoV-2/FLU/RSV plus assay is intended as an aid in the diagnosis of influenza from Nasopharyngeal swab specimens and should not be used as a sole basis for treatment. Nasal washings and aspirates are unacceptable for Xpert Xpress SARS-CoV-2/FLU/RSV testing.  Fact Sheet for Patients: EntrepreneurPulse.com.au  Fact Sheet for Healthcare Providers: IncredibleEmployment.be  This test is not yet approved or cleared by the [place] FDA and has been authorized for detection and/or diagnosis of SARS-CoV-2 by FDA under an Emergency Use Authorization (EUA). This EUA will remain in effect (meaning this test can be used) for the duration of the COVID-19 declaration under Section 564(b)(1) of the Act, 21 U.S.C. section 360bbb-3(b)(1), unless the authorization is terminated or revoked.  Performed at [facility], [address] [place], [place] [address]          Radiology Studies: DG HIP OPERATIVE UNILAT WITH  PELVIS RIGHT  Result Date: 11/15/2020 CLINICAL DATA:  Hip fracture require operative repair. EXAM: OPERATIVE RIGHT HIP (WITH PELVIS IF PERFORMED) TECHNIQUE: Fluoroscopic spot image(s) were submitted for interpretation post-operatively. FLUOROSCOPY TIME:  Fluoroscopy Time: 1 minute 44 seconds Radiation Exposure Index: 20.4 mGy COMPARISON:  Right hip radiographs 11/14/2020 FINDINGS: Six intraoperative fluoroscopic images are submitted and demonstrate ORIF of the intertrochanteric right femur fracture with placement of an intramedullary nail, hip screw, and more distal interlocking screw. Alignment is grossly anatomic on these limited fluoroscopic  images. IMPRESSION: Intraoperative images during ORIF of a right femur fracture. Electronically Signed   By: Logan Bores M.D.   On: 11/15/2020 11:29   DG FEMUR PORT, MIN 2 VIEWS RIGHT  Result Date: 11/15/2020 CLINICAL DATA:  Status post internal fixation of right femur fracture. EXAM: RIGHT FEMUR PORTABLE 2 VIEW COMPARISON:  Intraoperative fluoroscopic images earlier today. Right hip radiographs 11/14/2020. FINDINGS: There has been ORIF of the previously described right intertrochanteric femur fracture with placement of an intramedullary nail and hip screw. Alignment is grossly anatomic. The right hip and knee are located. Skin staples and postoperative soft tissue gas are noted. There is atherosclerotic vascular calcification. IMPRESSION: ORIF of intertrochanteric right femur fracture. Electronically Signed   By: Logan Bores M.D.   On: 11/15/2020 11:26        Scheduled Meds:  amLODipine  5 mg Oral Daily   [START ON 11/18/2020] aspirin EC  81 mg Oral BID WC   bisacodyl  10 mg Rectal Daily   Chlorhexidine Gluconate Cloth  6 each Topical Daily   cycloSPORINE  1 drop Both Eyes BID   enoxaparin (LOVENOX) injection  40 mg Subcutaneous Q24H   methocarbamol  500 mg Oral TID   metoprolol tartrate  100 mg Oral Daily   pantoprazole  40 mg Oral Daily    polyethylene glycol  17 g Oral Daily   senna-docusate  2 tablet Oral BID   sodium chloride flush  3 mL Intravenous Q12H   sodium chloride flush  3 mL Intravenous Q12H   Continuous Infusions:  sodium chloride Stopped (11/16/20 2356)   sodium chloride       LOS: 3 days   The patient is critically ill with multiple organ systems failure and requires high complexity decision making for assessment and support, frequent evaluation and titration of therapies, application of advanced monitoring technologies and extensive interpretation of multiple databases. Critical Care Time devoted to patient care services described in this note  Time spent: 40 minutes     Sydny Schnitzler, Geraldo Docker, MD [facility]   If 7PM-7AM, please contact night-coverage 11/17/2020, 8:03 AM

## 2020-11-17 NOTE — Plan of Care (Signed)
Pt A&O X2 to self and time. Pt needed frequent redirection and reorientation overnight. At one point patient was shopping and calling out "Laura Cook come over here". PRN trazodone with little improvement in sleep status. PRN ativan given X1 with good result. Pt able to relax and rest this morning.

## 2020-11-18 LAB — COMPREHENSIVE METABOLIC PANEL
ALT: 9 U/L (ref 0–44)
AST: 16 U/L (ref 15–41)
Albumin: 2.8 g/dL — ABNORMAL LOW (ref 3.5–5.0)
Alkaline Phosphatase: 53 U/L (ref 38–126)
Anion gap: 11 (ref 5–15)
BUN: 11 mg/dL (ref 8–23)
CO2: 21 mmol/L — ABNORMAL LOW (ref 22–32)
Calcium: 8.1 mg/dL — ABNORMAL LOW (ref 8.9–10.3)
Chloride: 94 mmol/L — ABNORMAL LOW (ref 98–111)
Creatinine, Ser: 0.35 mg/dL — ABNORMAL LOW (ref 0.44–1.00)
GFR, Estimated: >60 mL/min (ref >60)
Glucose, Bld: 71 mg/dL (ref 70–99)
Potassium: 3.3 mmol/L — ABNORMAL LOW (ref 3.5–5.1)
Sodium: 126 mmol/L — ABNORMAL LOW (ref 135–145)
Total Bilirubin: 1.1 mg/dL (ref 0.3–1.2)
Total Protein: 5 g/dL — ABNORMAL LOW (ref 6.5–8.1)

## 2020-11-18 LAB — CBC WITH DIFFERENTIAL/PLATELET
Abs Immature Granulocytes: 0.11 10*3/uL — ABNORMAL HIGH (ref 0.00–0.07)
Basophils Absolute: 0 10*3/uL (ref 0.0–0.1)
Basophils Relative: 0 %
Eosinophils Absolute: 0.2 10*3/uL (ref 0.0–0.5)
Eosinophils Relative: 2 %
HCT: 29.2 % — ABNORMAL LOW (ref 36.0–46.0)
Hemoglobin: 9.7 g/dL — ABNORMAL LOW (ref 12.0–15.0)
Immature Granulocytes: 1 %
Lymphocytes Relative: 7 %
Lymphs Abs: 0.8 10*3/uL (ref 0.7–4.0)
MCH: 28.6 pg (ref 26.0–34.0)
MCHC: 33.2 g/dL (ref 30.0–36.0)
MCV: 86.1 fL (ref 80.0–100.0)
Monocytes Absolute: 0.9 10*3/uL (ref 0.1–1.0)
Monocytes Relative: 9 %
Neutro Abs: 8.2 10*3/uL — ABNORMAL HIGH (ref 1.7–7.7)
Neutrophils Relative %: 81 %
Platelets: 261 10*3/uL (ref 150–400)
RBC: 3.39 MIL/uL — ABNORMAL LOW (ref 3.87–5.11)
RDW: 13.5 % (ref 11.5–15.5)
WBC: 10.2 10*3/uL (ref 4.0–10.5)
nRBC: 0 % (ref 0.0–0.2)

## 2020-11-18 LAB — RESP PANEL BY RT-PCR (FLU A&B, COVID) ARPGX2
Influenza A by PCR: NEGATIVE
Influenza B by PCR: NEGATIVE
SARS Coronavirus 2 by RT PCR: NEGATIVE

## 2020-11-18 LAB — MAGNESIUM: Magnesium: 1.5 mg/dL — ABNORMAL LOW (ref 1.7–2.4)

## 2020-11-18 LAB — PHOSPHORUS: Phosphorus: 2 mg/dL — ABNORMAL LOW (ref 2.5–4.6)

## 2020-11-18 MED ORDER — METHOCARBAMOL 500 MG PO TABS: 500.0000 mg | ORAL_TABLET | Freq: Three times a day (TID) | ORAL | 0 refills | Status: DC

## 2020-11-18 MED ORDER — HYDROCODONE-ACETAMINOPHEN 5-325 MG PO TABS: 1.0000 | ORAL_TABLET | ORAL | 0 refills | Status: DC | PRN

## 2020-11-18 MED ORDER — POTASSIUM CHLORIDE CRYS ER 20 MEQ PO TBCR
40.0000 meq | EXTENDED_RELEASE_TABLET | Freq: Once | ORAL | Status: AC
  Administered 2020-11-18: 40 meq via ORAL
  Filled 2020-11-18: qty 2

## 2020-11-18 MED ORDER — ASPIRIN EC 325 MG PO TBEC: 325.0000 mg | DELAYED_RELEASE_TABLET | Freq: Every day | ORAL | 3 refills | Status: DC

## 2020-11-18 MED ORDER — METOPROLOL SUCCINATE ER 50 MG PO TB24: 50.0000 mg | ORAL_TABLET | Freq: Every day | ORAL | 11 refills | Status: DC

## 2020-11-18 MED ORDER — ACETAMINOPHEN 325 MG PO TABS: 650.0000 mg | ORAL_TABLET | Freq: Four times a day (QID) | ORAL | 0 refills | Status: DC | PRN

## 2020-11-18 MED ORDER — MAGNESIUM SULFATE 4 GM/100ML IV SOLN
4.0000 g | Freq: Once | INTRAVENOUS | Status: AC
  Administered 2020-11-18: 4 g via INTRAVENOUS
  Filled 2020-11-18: qty 100

## 2020-11-18 MED ORDER — POLYETHYLENE GLYCOL 3350 17 G PO PACK: 17.0000 g | PACK | Freq: Every day | ORAL | 1 refills | Status: AC

## 2020-11-18 MED ORDER — SODIUM CHLORIDE 1 G PO TABS: 1.0000 g | ORAL_TABLET | Freq: Every day | ORAL | 0 refills | Status: AC

## 2020-11-18 MED ORDER — LORAZEPAM 0.5 MG PO TABS
0.5000 mg | ORAL_TABLET | ORAL | 0 refills | Status: DC
Start: 2020-11-18 — End: 2021-10-18

## 2020-11-18 MED ORDER — POTASSIUM PHOSPHATES 15 MMOLE/5ML IV SOLN
15.0000 mmol | Freq: Once | INTRAVENOUS | Status: DC
  Filled 2020-11-18: qty 5

## 2020-11-18 MED ORDER — PANTOPRAZOLE SODIUM 40 MG PO TBEC: 40.0000 mg | DELAYED_RELEASE_TABLET | Freq: Every day | ORAL | 1 refills | Status: DC

## 2020-11-18 NOTE — Care Management Important Message (Signed)
Important Message  Patient Details  Name: Laura Cook MRN: [id number] Date of Birth: Jul 04, 1922   Medicare Important Message Given:  Yes (spoke with by telephone, no copy needed.)     Tommy Medal 11/18/2020, 3:24 PM

## 2020-11-18 NOTE — Discharge Instructions (Signed)
1)Weightbearing: WBAT RLE Insicional and dressing care: Reinforce dressings as needed Orthopedic device(s): None VTE prophylaxis:  Recommend 325 mg Aspirin daily Pain control: PO pain medication PRN; judicious use of narcotics.  Follow - up plan: 2 weeks-- with Dr Amedeo Kinsman (ortho) - -- staples can be removed in rehab if there are no issues.  Would plan to see patient 6 weeks postop at ortho office   IF there are any concerns, we can schedule a follow up appointment.   2)Avoid ibuprofen/Advil/Aleve/Motrin/Goody Powders/Naproxen/BC powders/Meloxicam/Diclofenac/Indomethacin and other Nonsteroidal anti-inflammatory medications as these will make you more likely to bleed and can cause stomach ulcers, can also cause Kidney problems.   3)Repeat CBC and  BMP in 1 week

## 2020-11-18 NOTE — TOC Transition Note (Signed)
Transition of Care [facility]) - CM/SW Discharge Note   Patient Details  Name: Laura Cook MRN: [id number] Date of Birth: 1922-11-28  [facility] CM/SW Contact:  Ihor Gully, LCSW Phone Number: 11/18/2020, 1:04 PM   Clinical Narrative:    D/c clinicals sent to facility. TOC signing off.    Final next level of care: Skilled Nursing Facility Barriers to Discharge: No Barriers Identified   Patient Goals and CMS Choice Patient states their goals for this hospitalization and ongoing recovery are:: short term SNF   Choice offered to / list presented to : Patient  Discharge Placement              Patient chooses bed at: Other - please specify in the comment section below: [facility]) Patient to be transferred to facility by: [facility] Name of family member notified: son, Iona Beard Patient and family notified of of transfer: 11/18/20  Discharge Plan and Services In-house Referral: Clinical Social Work   Post Acute Care Choice: [facility]          DME Arranged: N/A                    Social Determinants of Health (SDOH) Interventions     Readmission Risk Interventions No flowsheet data found.

## 2020-11-18 NOTE — Discharge Summary (Signed)
Laura Cook, is a 87 y.o. female  DOB 06/13/1922  MRN [id number].  Admission date:  11/14/2020  Admitting Physician  Roxan Hockey, MD  Discharge Date:  11/18/2020   Primary MD  Monico Blitz, MD  Recommendations for primary care physician for things to follow:   1)Weightbearing: WBAT RLE Insicional and dressing care: Reinforce dressings as needed Orthopedic device(s): None VTE prophylaxis:  Recommend 325 mg Aspirin daily Pain control: PO pain medication PRN; judicious use of narcotics.  Follow - up plan: 2 weeks-- with Dr Amedeo Kinsman (ortho) - -- staples can be removed in rehab if there are no issues.  Would plan to see patient 6 weeks postop at ortho office   IF there are any concerns, we can schedule a follow up appointment.   2)Avoid ibuprofen/Advil/Aleve/Motrin/Goody Powders/Naproxen/BC powders/Meloxicam/Diclofenac/Indomethacin and other Nonsteroidal anti-inflammatory medications as these will make you more likely to bleed and can cause stomach ulcers, can also cause Kidney problems.   3)Repeat CBC and  BMP in 1 week   Admission Diagnosis  Hypokalemia [E87.6] Hip fracture ([place]) [S72.009A] Hyponatremia [E87.1] Fall, initial encounter [W19.XXXA] Closed fracture of right hip, initial encounter ([place]) [S72.001A]   Discharge Diagnosis  Hypokalemia [E87.6] Hip fracture ([place]) [S72.009A] Hyponatremia [E87.1] Fall, initial encounter [W19.XXXA] Closed fracture of right hip, initial encounter ([place]) [S72.001A]    Principal Problem:   Rt Hip fracture /S/p Fall Active Problems:   HTN (hypertension)   Hyponatremia--HCTZ induced   GERD (gastroesophageal reflux disease)   HLD (hyperlipidemia)      Past Medical History:  Diagnosis Date   Hypertension     Past Surgical History:  Procedure Laterality Date   INTRAMEDULLARY (IM) NAIL INTERTROCHANTERIC Right 11/15/2020   Procedure: INTRAMEDULLARY (IM)  NAIL INTERTROCHANTRIC;  Surgeon: Mordecai Rasmussen, MD;  Location: [facility];  Service: Orthopedics;  Laterality: Right;   YAG LASER APPLICATION Left 2/63/3354   Procedure: YAG LASER APPLICATION;  Surgeon: Williams Che, MD;  Location: [facility];  Service: Ophthalmology;  Laterality: Left;     HPI  from the history and physical done on the day of admission:    Laura Cook  is a 87 y.o. female (almost 87 yo) with Pmhx relevant for HTN, HLD, GERD who presents with Rt Hip Pain after falling at home in her kitchen ---She states she was bringing milk to the table while holding onto things, when she fell.  She was Not actively using her walker when she fell.  She has a chronic gait instability.  No fever  Or chills , No Nausea, Vomiting or Diarrhea -No chest pain, no HAs--, no visual disturbances,  - CXR with possible Lt sided rib fractures---may need CT chest to evaluate mild widening of the RIGHT paratracheal stripe - Mag 1.8 -Hgb 11.3,  WBC 5.9 -Na 127, K-3.2, chloride 91, creatinine 0.51 Hip Xrays ---shows Comminuted intertrochanteric fracture of the RIGHT proximal femur.     Hospital Course:     Brief Summary:- 87 y.o. female (almost 87 yo) with Pmhx relevant for HTN,  HLD, GERD who was admitted on 11/14/2020 after mechanical fall in her kitchen at home and right hip fracture    A/p  1)Rt Hip Fx--status post mechanical fall with right hip fracture -Orthopedic consult appreciated-- S/p  ORIF on 11/15/2020 -Further management per orthopedic team -Pain control and muscle relaxants as ordered -Weightbearing: WBAT RLE Insicional and dressing care: Reinforce dressings as needed Orthopedic device(s): None VTE prophylaxis:  Recommend 325 mg Aspirin daily Pain control: PO pain medication PRN; judicious use of narcotics.  Follow - up plan: 2 weeks-- with Dr Amedeo Kinsman (ortho) - -- staples can be removed in rehab if there are no issues.  Would plan to see patient 6 weeks postop at ortho office   IF  there are any concerns, we can schedule a follow up appointment.    2)Hyponatremia/Hypokalemia--- due to HCTZ use -Replace potassium, Stopped HCTZ Nacl Tab 1 gm daily for 1 week -Repeat BMP in 1 week   3)HTN--stable, continue amlodipine, hold HCTZ due to #2 above -  4)HLD--hold Crestor, given age of almost 41 years--doubt significant benefit down the road from statin therapy   5)GERD--Protonix as ordered   6) abnormal chest x-ray-----CXR with possible Lt sided rib fractures---may need CT chest to evaluate mild widening of the RIGHT paratracheal stripe as outpatient   7) acute on chronic anemia due to acute blood loss anemia--due to hip fracture and ORIF -Hgb is stable at 9.7 post transfusion of 1 unit of PRBC   8) acute metabolic encephalopathy/confusion--- suspect opiate related, as needed lorazepam  -UA--- not suggestive of UTI -Be Judicious with opiate   Disposition--- SNF rehab   Dispo: The patient is from: Home              Anticipated d/c is to: SNF                Code Status :  -  Code Status: Full Code    Family Communication:      Discussed with son Iona Beard , son Awanda Mink and Jerome's wife --questions answered   Consults  :  ortho  Discharge Condition: stable  Follow UP   Contact information for after-discharge care     Destination     [facility] .   Service: Skilled Nursing Contact information: [address] [place] obtained - ortho  Diet and Activity recommendation:  As advised  Discharge Instructions   Discharge Instructions     Call MD for:  difficulty breathing, headache or visual disturbances   Complete by: As directed    Call MD for:  persistant nausea and vomiting   Complete by: As directed    Call MD for:  redness, tenderness, or signs of infection (pain, swelling, redness, odor or green/yellow discharge  around incision site)   Complete by: As directed    Call MD for:  severe uncontrolled pain   Complete by: As directed    Call MD for:  temperature >100.4   Complete by: As directed    Diet - low sodium heart healthy   Complete by: As directed    Discharge instructions   Complete by: As directed    1)Weightbearing: WBAT RLE Insicional and dressing care: Reinforce dressings as needed Orthopedic device(s): None VTE prophylaxis:  Recommend 325 mg Aspirin daily Pain control: PO  pain medication PRN; judicious use of narcotics.  Follow - up plan: 2 weeks-- with Dr Amedeo Kinsman (ortho) - -- staples can be removed in rehab if there are no issues.  Would plan to see patient 6 weeks postop at ortho office   IF there are any concerns, we can schedule a follow up appointment.   2)Avoid ibuprofen/Advil/Aleve/Motrin/Goody Powders/Naproxen/BC powders/Meloxicam/Diclofenac/Indomethacin and other Nonsteroidal anti-inflammatory medications as these will make you more likely to bleed and can cause stomach ulcers, can also cause Kidney problems.   3)Repeat CBC and  BMP in 1 week   Discharge wound care:   Complete by: As directed    Keep wound clean and dry---   Increase activity slowly   Complete by: As directed          Discharge Medications     Allergies as of 11/18/2020   No Known Allergies      Medication List     STOP taking these medications    bisacodyl 5 MG EC tablet Commonly known as: DULCOLAX   esomeprazole 20 MG capsule Commonly known as: [place] Replaced by: pantoprazole 40 MG tablet   hydrochlorothiazide 50 MG tablet Commonly known as: HYDRODIURIL   meloxicam 15 MG tablet Commonly known as: MOBIC   metoprolol tartrate 100 MG tablet Commonly known as: LOPRESSOR   rosuvastatin 10 MG tablet Commonly known as: CRESTOR   traMADol 50 MG tablet Commonly known as: ULTRAM       TAKE these medications    acetaminophen 325 MG tablet Commonly known as: TYLENOL Take 2  tablets (650 mg total) by mouth every 6 (six) hours as needed for mild pain (or Fever >/= 101). What changed: reasons to take this   amLODipine 5 MG tablet Commonly known as: NORVASC Take 5 mg by mouth daily.   aspirin EC 325 MG tablet Take 1 tablet (325 mg total) by mouth daily with breakfast. What changed: when to take this   HYDROcodone-acetaminophen 5-325 MG tablet Commonly known as: Norco Take 1 tablet by mouth every 4 (four) hours as needed for moderate pain.   LORazepam 0.5 MG tablet Commonly known as: Ativan Take 1 tablet (0.5 mg total) by mouth See admin instructions. Take 1 tablet every morning and 1 tablet at nighttime   meclizine 25 MG tablet Commonly known as: ANTIVERT Take 25 mg by mouth every 8 (eight) hours as needed.   methocarbamol 500 MG tablet Commonly known as: ROBAXIN Take 1 tablet (500 mg total) by mouth 3 (three) times daily.   metoprolol succinate 50 MG 24 hr tablet Commonly known as: Toprol XL Take 1 tablet (50 mg total) by mouth daily. Take with or immediately following a meal.   OVER THE COUNTER MEDICATION Take 1 tablet by mouth daily. calcium   pantoprazole 40 MG tablet Commonly known as: PROTONIX Take 1 tablet (40 mg total) by mouth daily. Start taking on: November 19, 2020 Replaces: esomeprazole 20 MG capsule   polyethylene glycol 17 g packet Commonly known as: MiraLax Take 17 g by mouth daily.   Restasis 0.05 % ophthalmic emulsion Generic drug: cycloSPORINE 1 drop 2 (two) times daily.   sodium chloride 1 g tablet Take 1 tablet (1 g total) by mouth daily for 10 days.               Discharge Care Instructions  (From admission, onward)           Start     Ordered   11/18/20 0000  Discharge wound  care:       Comments: Keep wound clean and dry---   11/18/20 1117            Major procedures and Radiology Reports - PLEASE review detailed and final reports for all details, in brief -  DG Chest 1 View  Result  Date: 11/14/2020 CLINICAL DATA:  pain EXAM: CHEST  1 VIEW COMPARISON:  None. FINDINGS: The cardiomediastinal silhouette is enlarged in contour. Prominence of the RIGHT paratracheal stripeatherosclerotic calcifications. Biapical scarring. No pleural effusion. No pneumothorax. No acute pleuroparenchymal abnormality. Visualized abdomen is unremarkable. Age-indeterminate compression fracture of the midthoracic vertebral body; recommend correlation with point tenderness. Lucency and mild cortical irregularity of the LEFT lateral fifth and sixth rib. Remote LEFT posterior sixth and seventh rib fractures. IMPRESSION: 1. No focal consolidation. 2. Mild widening of the RIGHT paratracheal stripe, possibly summation from overlapping vascular structures. Consider further dedicated evaluation with PA and lateral radiograph versus nonemergent chest CT if clinically indicated. 3. Age-indeterminate compression fracture of a midthoracic vertebral body; recommend correlation with point tenderness 4. Subtle lucency traversing several LEFT-sided ribs may reflect nondisplaced rib fractures in the appropriate clinical setting. Correlate with point tenderness Electronically Signed   By: Valentino Saxon M.D.   On: 11/14/2020 10:25   DG HIP OPERATIVE UNILAT WITH PELVIS RIGHT  Result Date: 11/15/2020 CLINICAL DATA:  Hip fracture require operative repair. EXAM: OPERATIVE RIGHT HIP (WITH PELVIS IF PERFORMED) TECHNIQUE: Fluoroscopic spot image(s) were submitted for interpretation post-operatively. FLUOROSCOPY TIME:  Fluoroscopy Time: 1 minute 44 seconds Radiation Exposure Index: 20.4 mGy COMPARISON:  Right hip radiographs 11/14/2020 FINDINGS: Six intraoperative fluoroscopic images are submitted and demonstrate ORIF of the intertrochanteric right femur fracture with placement of an intramedullary nail, hip screw, and more distal interlocking screw. Alignment is grossly anatomic on these limited fluoroscopic images. IMPRESSION:  Intraoperative images during ORIF of a right femur fracture. Electronically Signed   By: Logan Bores M.D.   On: 11/15/2020 11:29   DG Hip Unilat With Pelvis 2-3 Views Right  Result Date: 11/14/2020 CLINICAL DATA:  pain EXAM: DG HIP (WITH PELVIS) 3V RIGHT COMPARISON:  None. FINDINGS: Osteopenia. There is a comminuted intertrochanteric fracture of the RIGHT proximal femur. There is minimal displacement of the fracture fragments. Femoral head appears seated within the acetabulum. Severe vascular calcifications. Pelvic phleboliths. Degenerative changes of the lower lumbar spine. Status post ORIF of the LEFT femur. Mild cortical thickening of the LEFT superior pubic ramus likely reflecting sequela of remote prior fracture. Limited assessment of the sacrum secondary to overlying bowel contents and osteopenia. IMPRESSION: Comminuted intertrochanteric fracture of the RIGHT proximal femur. Electronically Signed   By: Valentino Saxon M.D.   On: 11/14/2020 10:18   DG FEMUR PORT, MIN 2 VIEWS RIGHT  Result Date: 11/15/2020 CLINICAL DATA:  Status post internal fixation of right femur fracture. EXAM: RIGHT FEMUR PORTABLE 2 VIEW COMPARISON:  Intraoperative fluoroscopic images earlier today. Right hip radiographs 11/14/2020. FINDINGS: There has been ORIF of the previously described right intertrochanteric femur fracture with placement of an intramedullary nail and hip screw. Alignment is grossly anatomic. The right hip and knee are located. Skin staples and postoperative soft tissue gas are noted. There is atherosclerotic vascular calcification. IMPRESSION: ORIF of intertrochanteric right femur fracture. Electronically Signed   By: Logan Bores M.D.   On: 11/15/2020 11:26    Micro Results   Recent Results (from the past 240 hour(s))  Resp Panel by RT-PCR (Flu A&B, Covid) Nasopharyngeal Swab  Status: None   Collection Time: 11/14/20 10:29 AM   Specimen: Nasopharyngeal Swab; Nasopharyngeal(NP) swabs in vial  transport medium  Result Value Ref Range Status   SARS Coronavirus 2 by RT PCR NEGATIVE NEGATIVE Final    Comment: (NOTE) SARS-CoV-2 target nucleic acids are NOT DETECTED.  The SARS-CoV-2 RNA is generally detectable in upper respiratory specimens during the acute phase of infection. The lowest concentration of SARS-CoV-2 viral copies this assay can detect is 138 copies/mL. A negative result does not preclude SARS-Cov-2 infection and should not be used as the sole basis for treatment or other patient management decisions. A negative result may occur with  improper specimen collection/handling, submission of specimen other than nasopharyngeal swab, presence of viral mutation(s) within the areas targeted by this assay, and inadequate number of viral copies(<138 copies/mL). A negative result must be combined with clinical observations, patient history, and epidemiological information. The expected result is Negative.  Fact Sheet for Patients:  EntrepreneurPulse.com.au  Fact Sheet for Healthcare Providers:  IncredibleEmployment.be  This test is no t yet approved or cleared by the [place] FDA and  has been authorized for detection and/or diagnosis of SARS-CoV-2 by FDA under an Emergency Use Authorization (EUA). This EUA will remain  in effect (meaning this test can be used) for the duration of the COVID-19 declaration under Section 564(b)(1) of the Act, 21 U.S.C.section 360bbb-3(b)(1), unless the authorization is terminated  or revoked sooner.       Influenza A by PCR NEGATIVE NEGATIVE Final   Influenza B by PCR NEGATIVE NEGATIVE Final    Comment: (NOTE) The Xpert Xpress SARS-CoV-2/FLU/RSV plus assay is intended as an aid in the diagnosis of influenza from Nasopharyngeal swab specimens and should not be used as a sole basis for treatment. Nasal washings and aspirates are unacceptable for Xpert Xpress SARS-CoV-2/FLU/RSV testing.  Fact  Sheet for Patients: EntrepreneurPulse.com.au  Fact Sheet for Healthcare Providers: IncredibleEmployment.be  This test is not yet approved or cleared by the [place] FDA and has been authorized for detection and/or diagnosis of SARS-CoV-2 by FDA under an Emergency Use Authorization (EUA). This EUA will remain in effect (meaning this test can be used) for the duration of the COVID-19 declaration under Section 564(b)(1) of the Act, 21 U.S.C. section 360bbb-3(b)(1), unless the authorization is terminated or revoked.  Performed at [facility], [address], [place], [place] [address]    Today   Subjective    Sanii Kleinman today has no new complaints  No fever  Or chills   No Nausea, Vomiting or Diarrhea --- More coherent more appropriate Son Iona Beard at bedside          Patient has been seen and examined prior to discharge   Objective   Blood pressure (!) 119/54, pulse 94, temperature 97.8 F (36.6 C), resp. rate 18, height 5\' 4"  (1.626 m), weight 52.2 kg, SpO2 97 %.   Intake/Output Summary (Last 24 hours) at 11/18/2020 1121 Last data filed at 11/18/2020 0300 Gross per 24 hour  Intake 594.91 ml  Output 400 ml  Net 194.91 ml    Exam Physical Examination: General appearance -alert oriented, cooperative,  Mental status -AAO x 3, affect is appropriate Eyes - sclera anicteric Neck - supple, no JVD elevation , Chest - clear  to auscultation bilaterally, symmetrical air movement,  Heart - S1 and S2 normal, regular  Abdomen - soft, nontender, nondistended, no masses or organomegaly Neurological - screening mental status exam normal, neck supple without rigidity, cranial nerves  II through XII intact, DTR's normal and symmetric Extremities - no pedal edema noted, intact peripheral pulses  Skin - warm, dry MSK-postop wound intact, appropriate postop tenderness   Data Review   CBC w Diff:  Lab Results  Component Value Date   WBC  10.2 11/18/2020   HGB 9.7 (L) 11/18/2020   HCT 29.2 (L) 11/18/2020   PLT 261 11/18/2020   LYMPHOPCT 7 11/18/2020   MONOPCT 9 11/18/2020   EOSPCT 2 11/18/2020   BASOPCT 0 11/18/2020    CMP:  Lab Results  Component Value Date   NA 126 (L) 11/18/2020   K 3.3 (L) 11/18/2020   CL 94 (L) 11/18/2020   CO2 21 (L) 11/18/2020   BUN 11 11/18/2020   CREATININE 0.35 (L) 11/18/2020   PROT 5.0 (L) 11/18/2020   ALBUMIN 2.8 (L) 11/18/2020   BILITOT 1.1 11/18/2020   ALKPHOS 53 11/18/2020   AST 16 11/18/2020   ALT 9 11/18/2020  .   Total Discharge time is about 33 minutes  Roxan Hockey M.D on 11/18/2020 at 11:21 AM  Go to [email or web address] -  for contact info  [facility] - Office  [phone]

## 2020-11-19 DIAGNOSIS — Z299 Encounter for prophylactic measures, unspecified: Secondary | ICD-10-CM | POA: Diagnosis not present

## 2020-11-19 DIAGNOSIS — G9341 Metabolic encephalopathy: Secondary | ICD-10-CM | POA: Diagnosis not present

## 2020-11-19 DIAGNOSIS — R41841 Cognitive communication deficit: Secondary | ICD-10-CM | POA: Diagnosis not present

## 2020-11-19 DIAGNOSIS — S72141D Displaced intertrochanteric fracture of right femur, subsequent encounter for closed fracture with routine healing: Secondary | ICD-10-CM | POA: Diagnosis not present

## 2020-11-19 DIAGNOSIS — M6281 Muscle weakness (generalized): Secondary | ICD-10-CM | POA: Diagnosis not present

## 2020-11-19 DIAGNOSIS — Z743 Need for continuous supervision: Secondary | ICD-10-CM | POA: Diagnosis not present

## 2020-11-19 DIAGNOSIS — Z7401 Bed confinement status: Secondary | ICD-10-CM | POA: Diagnosis not present

## 2020-11-19 DIAGNOSIS — E871 Hypo-osmolality and hyponatremia: Secondary | ICD-10-CM | POA: Diagnosis not present

## 2020-11-19 DIAGNOSIS — R69 Illness, unspecified: Secondary | ICD-10-CM | POA: Diagnosis not present

## 2020-11-19 DIAGNOSIS — S72001S Fracture of unspecified part of neck of right femur, sequela: Secondary | ICD-10-CM | POA: Diagnosis not present

## 2020-11-19 DIAGNOSIS — R0902 Hypoxemia: Secondary | ICD-10-CM | POA: Diagnosis not present

## 2020-11-19 DIAGNOSIS — E876 Hypokalemia: Secondary | ICD-10-CM | POA: Diagnosis not present

## 2020-11-19 DIAGNOSIS — S72001A Fracture of unspecified part of neck of right femur, initial encounter for closed fracture: Secondary | ICD-10-CM | POA: Diagnosis not present

## 2020-11-19 DIAGNOSIS — I10 Essential (primary) hypertension: Secondary | ICD-10-CM | POA: Diagnosis not present

## 2020-11-19 DIAGNOSIS — Z9181 History of falling: Secondary | ICD-10-CM | POA: Diagnosis not present

## 2020-11-19 DIAGNOSIS — S72001D Fracture of unspecified part of neck of right femur, subsequent encounter for closed fracture with routine healing: Secondary | ICD-10-CM | POA: Diagnosis not present

## 2020-11-19 DIAGNOSIS — R42 Dizziness and giddiness: Secondary | ICD-10-CM | POA: Diagnosis not present

## 2020-11-19 DIAGNOSIS — M1990 Unspecified osteoarthritis, unspecified site: Secondary | ICD-10-CM | POA: Diagnosis not present

## 2020-11-19 DIAGNOSIS — R2689 Other abnormalities of gait and mobility: Secondary | ICD-10-CM | POA: Diagnosis not present

## 2020-11-19 LAB — MAGNESIUM: Magnesium: 2.1 mg/dL (ref 1.7–2.4)

## 2020-11-19 LAB — CBC WITH DIFFERENTIAL/PLATELET
Abs Immature Granulocytes: 0.07 10*3/uL (ref 0.00–0.07)
Basophils Absolute: 0.1 10*3/uL (ref 0.0–0.1)
Basophils Relative: 1 %
Eosinophils Absolute: 0.4 10*3/uL (ref 0.0–0.5)
Eosinophils Relative: 4 %
HCT: 24.9 % — ABNORMAL LOW (ref 36.0–46.0)
Hemoglobin: 8.4 g/dL — ABNORMAL LOW (ref 12.0–15.0)
Immature Granulocytes: 1 %
Lymphocytes Relative: 14 %
Lymphs Abs: 1.1 10*3/uL (ref 0.7–4.0)
MCH: 28.9 pg (ref 26.0–34.0)
MCHC: 33.7 g/dL (ref 30.0–36.0)
MCV: 85.6 fL (ref 80.0–100.0)
Monocytes Absolute: 0.9 10*3/uL (ref 0.1–1.0)
Monocytes Relative: 11 %
Neutro Abs: 5.6 10*3/uL (ref 1.7–7.7)
Neutrophils Relative %: 69 %
Platelets: 322 10*3/uL (ref 150–400)
RBC: 2.91 MIL/uL — ABNORMAL LOW (ref 3.87–5.11)
RDW: 13.5 % (ref 11.5–15.5)
WBC: 8.1 10*3/uL (ref 4.0–10.5)
nRBC: 0 % (ref 0.0–0.2)

## 2020-11-19 LAB — PHOSPHORUS: Phosphorus: 1.8 mg/dL — ABNORMAL LOW (ref 2.5–4.6)

## 2020-11-19 LAB — COMPREHENSIVE METABOLIC PANEL
ALT: 9 U/L (ref 0–44)
AST: 13 U/L — ABNORMAL LOW (ref 15–41)
Albumin: 2.6 g/dL — ABNORMAL LOW (ref 3.5–5.0)
Alkaline Phosphatase: 49 U/L (ref 38–126)
Anion gap: 7 (ref 5–15)
BUN: 17 mg/dL (ref 8–23)
CO2: 23 mmol/L (ref 22–32)
Calcium: 8 mg/dL — ABNORMAL LOW (ref 8.9–10.3)
Chloride: 97 mmol/L — ABNORMAL LOW (ref 98–111)
Creatinine, Ser: 0.35 mg/dL — ABNORMAL LOW (ref 0.44–1.00)
GFR, Estimated: >60 mL/min (ref >60)
Glucose, Bld: 102 mg/dL — ABNORMAL HIGH (ref 70–99)
Potassium: 3.6 mmol/L (ref 3.5–5.1)
Sodium: 127 mmol/L — ABNORMAL LOW (ref 135–145)
Total Bilirubin: 0.9 mg/dL (ref 0.3–1.2)
Total Protein: 4.8 g/dL — ABNORMAL LOW (ref 6.5–8.1)

## 2020-11-19 LAB — RENAL FUNCTION PANEL
Albumin: 2.6 g/dL — ABNORMAL LOW (ref 3.5–5.0)
Anion gap: 6 (ref 5–15)
BUN: 17 mg/dL (ref 8–23)
CO2: 24 mmol/L (ref 22–32)
Calcium: 7.8 mg/dL — ABNORMAL LOW (ref 8.9–10.3)
Chloride: 96 mmol/L — ABNORMAL LOW (ref 98–111)
Creatinine, Ser: 0.36 mg/dL — ABNORMAL LOW (ref 0.44–1.00)
GFR, Estimated: >60 mL/min (ref >60)
Glucose, Bld: 102 mg/dL — ABNORMAL HIGH (ref 70–99)
Phosphorus: 1.8 mg/dL — ABNORMAL LOW (ref 2.5–4.6)
Potassium: 3.5 mmol/L (ref 3.5–5.1)
Sodium: 126 mmol/L — ABNORMAL LOW (ref 135–145)

## 2020-11-19 NOTE — Plan of Care (Signed)

## 2020-11-19 NOTE — Progress Notes (Signed)
EMS arrived to transport patient to [facility] rehab. Walked into room with EMS transporters and Nigel Bridgeman, LPN. Verified that patient is alert and oriented x4. Vitals obtained. Patient refusing to go to rehab at this time. Patient stated "Im not going. I want to go to bed and yall are bothering me". Called patient's son, Iona Beard who spoke with his mother on the phone. He stated he will be here in the morning to discuss what we can do as far as discharge. Patient remains in the bed at this time. Notified Dr. Clearence Ped and charge RN, Lawernce Keas.

## 2020-11-19 NOTE — Progress Notes (Signed)
Patient seen and evaluated this morning with no acute overnight events noted.  Please refer to discharge summary dictated 9/29 for full details.  She apparently refused EMS transport overnight, but is now agreeable.  She is currently fast asleep and denies any significant complaints.  Total care time: 10 minutes.

## 2020-11-22 DIAGNOSIS — S72001S Fracture of unspecified part of neck of right femur, sequela: Secondary | ICD-10-CM | POA: Diagnosis not present

## 2020-11-22 DIAGNOSIS — E871 Hypo-osmolality and hyponatremia: Secondary | ICD-10-CM | POA: Diagnosis not present

## 2020-11-22 DIAGNOSIS — I10 Essential (primary) hypertension: Secondary | ICD-10-CM | POA: Diagnosis not present

## 2020-11-22 DIAGNOSIS — R42 Dizziness and giddiness: Secondary | ICD-10-CM | POA: Diagnosis not present

## 2020-11-25 DIAGNOSIS — Z299 Encounter for prophylactic measures, unspecified: Secondary | ICD-10-CM | POA: Diagnosis not present

## 2020-11-25 DIAGNOSIS — I10 Essential (primary) hypertension: Secondary | ICD-10-CM | POA: Diagnosis not present

## 2020-11-25 DIAGNOSIS — R69 Illness, unspecified: Secondary | ICD-10-CM | POA: Diagnosis not present

## 2020-11-30 ENCOUNTER — Encounter: Payer: Self-pay | Admitting: Orthopedic Surgery

## 2020-11-30 ENCOUNTER — Other Ambulatory Visit: Payer: Self-pay

## 2020-11-30 ENCOUNTER — Ambulatory Visit: Payer: Medicare HMO

## 2020-11-30 ENCOUNTER — Ambulatory Visit (INDEPENDENT_AMBULATORY_CARE_PROVIDER_SITE_OTHER): Payer: Medicare HMO | Admitting: Orthopedic Surgery

## 2020-11-30 VITALS — Ht 64.0 in | Wt 115.0 lb

## 2020-11-30 DIAGNOSIS — S72001A Fracture of unspecified part of neck of right femur, initial encounter for closed fracture: Secondary | ICD-10-CM

## 2020-11-30 DIAGNOSIS — S72001D Fracture of unspecified part of neck of right femur, subsequent encounter for closed fracture with routine healing: Secondary | ICD-10-CM

## 2020-11-30 NOTE — Progress Notes (Signed)
Orthopaedic Postop Note  Assessment: Laura Cook is a 87 y.o. female s/p CMN for a right intertrochanteric femur fracture  DOS: 11/17/20  Plan: Radiographs stable Staples removed in clinic today.  Steri strips placed. Steri strips do not have to remain in place for any length of time.  Dry dressing as needed WBAT on the RLE; using assistive device.  Patient is only taking a few steps at a time, with assistance.  She requires additional time in rehab in order to get stronger. Limited assistance at home Continue DVT Ppx at discretion of medicine team.  Recommend aspirin 81 mg BID Medications as needed; judicious use of narcotics.   Follow-up: Return in about 4 weeks (around 12/28/2020). XR at next visit: AP pelvis, R femur  Subjective:  Chief Complaint  Patient presents with   Routine Post Op    Rt hip fx DOS 11/15/20    History of Present Illness: Laura Cook is a 87 y.o. female who presents following the above stated procedure.  She remains in a rehab facility.  Pain is improving.  No issues with her incisions.  She is working with PT and progressing.   She has only taken a few steps with assistance.  Her son is at home, but cannot provide enough assistance for her.  She has been told that she will be discharged home later this week.   Review of Systems: No fevers or chills No numbness or tingling No Chest Pain No shortness of breath   Objective: Ht 5\' 4"  (1.626 m)   Wt 115 lb (52.2 kg)   BMI 19.74 kg/m   Physical Exam:  Elderly female, seated in a wheelchair  Alert and oriented.  No acute distress  Lateral hip surgical incisions are healing well.  No surrounding erythema or drainage  No pain with axial loading.  Minimal pain with gentle ROM of the right hip Can maintain a straight leg raise.  Sensation intact to the dorsum of her foot.  Active motion intact in the TA/EHL.   IMAGING: I personally ordered and reviewed the following images:  AP pelvis  and XR of the right femur demonstrates a cephalomedullary nail in good position.  No evidence of hardware failure or migration.  No interval subsidence.  There has been some interval consolidation at the fracture site.  No acute injury noted.   Impression: healing right intertrochanteric femur fracture following operative fixation.     Mordecai Rasmussen, MD 11/30/2020 8:42 AM

## 2020-11-30 NOTE — Patient Instructions (Addendum)
Staples removed in clinic today.  Steri strips placed. Steri strips do not have to remain in place for any length of time.  Dry dressing as needed WBAT on the RLE; using assistive device.  Continue DVT Ppx at discretion of medicine team.  Recommend aspirin 81 mg BID Medications as needed; judicious use of narcotics.  Patient is only taking a few steps at a time, with assistance.  She requires additional time in rehab in order to get stronger. Limited assistance at home  Follow up in 4 weeks.

## 2020-12-09 DIAGNOSIS — I10 Essential (primary) hypertension: Secondary | ICD-10-CM | POA: Diagnosis not present

## 2020-12-09 DIAGNOSIS — S72001S Fracture of unspecified part of neck of right femur, sequela: Secondary | ICD-10-CM | POA: Diagnosis not present

## 2020-12-09 DIAGNOSIS — Z299 Encounter for prophylactic measures, unspecified: Secondary | ICD-10-CM | POA: Diagnosis not present

## 2020-12-09 DIAGNOSIS — M1990 Unspecified osteoarthritis, unspecified site: Secondary | ICD-10-CM | POA: Diagnosis not present

## 2020-12-13 DIAGNOSIS — E785 Hyperlipidemia, unspecified: Secondary | ICD-10-CM | POA: Diagnosis not present

## 2020-12-13 DIAGNOSIS — S72141A Displaced intertrochanteric fracture of right femur, initial encounter for closed fracture: Secondary | ICD-10-CM | POA: Diagnosis not present

## 2020-12-13 DIAGNOSIS — Z7982 Long term (current) use of aspirin: Secondary | ICD-10-CM | POA: Diagnosis not present

## 2020-12-13 DIAGNOSIS — S72001D Fracture of unspecified part of neck of right femur, subsequent encounter for closed fracture with routine healing: Secondary | ICD-10-CM | POA: Diagnosis not present

## 2020-12-13 DIAGNOSIS — Z9181 History of falling: Secondary | ICD-10-CM | POA: Diagnosis not present

## 2020-12-13 DIAGNOSIS — M1990 Unspecified osteoarthritis, unspecified site: Secondary | ICD-10-CM | POA: Diagnosis not present

## 2020-12-13 DIAGNOSIS — I10 Essential (primary) hypertension: Secondary | ICD-10-CM | POA: Diagnosis not present

## 2020-12-13 DIAGNOSIS — E871 Hypo-osmolality and hyponatremia: Secondary | ICD-10-CM | POA: Diagnosis not present

## 2020-12-14 DIAGNOSIS — E785 Hyperlipidemia, unspecified: Secondary | ICD-10-CM | POA: Diagnosis not present

## 2020-12-14 DIAGNOSIS — Z7982 Long term (current) use of aspirin: Secondary | ICD-10-CM | POA: Diagnosis not present

## 2020-12-14 DIAGNOSIS — I10 Essential (primary) hypertension: Secondary | ICD-10-CM | POA: Diagnosis not present

## 2020-12-14 DIAGNOSIS — Z9181 History of falling: Secondary | ICD-10-CM | POA: Diagnosis not present

## 2020-12-14 DIAGNOSIS — E871 Hypo-osmolality and hyponatremia: Secondary | ICD-10-CM | POA: Diagnosis not present

## 2020-12-14 DIAGNOSIS — S72001D Fracture of unspecified part of neck of right femur, subsequent encounter for closed fracture with routine healing: Secondary | ICD-10-CM | POA: Diagnosis not present

## 2020-12-14 DIAGNOSIS — M1990 Unspecified osteoarthritis, unspecified site: Secondary | ICD-10-CM | POA: Diagnosis not present

## 2020-12-15 DIAGNOSIS — E785 Hyperlipidemia, unspecified: Secondary | ICD-10-CM | POA: Diagnosis not present

## 2020-12-15 DIAGNOSIS — I10 Essential (primary) hypertension: Secondary | ICD-10-CM | POA: Diagnosis not present

## 2020-12-15 DIAGNOSIS — S72001D Fracture of unspecified part of neck of right femur, subsequent encounter for closed fracture with routine healing: Secondary | ICD-10-CM | POA: Diagnosis not present

## 2020-12-15 DIAGNOSIS — Z7982 Long term (current) use of aspirin: Secondary | ICD-10-CM | POA: Diagnosis not present

## 2020-12-15 DIAGNOSIS — M1990 Unspecified osteoarthritis, unspecified site: Secondary | ICD-10-CM | POA: Diagnosis not present

## 2020-12-15 DIAGNOSIS — Z9181 History of falling: Secondary | ICD-10-CM | POA: Diagnosis not present

## 2020-12-15 DIAGNOSIS — E871 Hypo-osmolality and hyponatremia: Secondary | ICD-10-CM | POA: Diagnosis not present

## 2020-12-16 DIAGNOSIS — I10 Essential (primary) hypertension: Secondary | ICD-10-CM | POA: Diagnosis not present

## 2020-12-16 DIAGNOSIS — E7800 Pure hypercholesterolemia, unspecified: Secondary | ICD-10-CM | POA: Diagnosis not present

## 2020-12-16 DIAGNOSIS — E871 Hypo-osmolality and hyponatremia: Secondary | ICD-10-CM | POA: Diagnosis not present

## 2020-12-16 DIAGNOSIS — Z2821 Immunization not carried out because of patient refusal: Secondary | ICD-10-CM | POA: Diagnosis not present

## 2020-12-16 DIAGNOSIS — Z6821 Body mass index (BMI) 21.0-21.9, adult: Secondary | ICD-10-CM | POA: Diagnosis not present

## 2020-12-16 DIAGNOSIS — Z299 Encounter for prophylactic measures, unspecified: Secondary | ICD-10-CM | POA: Diagnosis not present

## 2020-12-20 DIAGNOSIS — M1990 Unspecified osteoarthritis, unspecified site: Secondary | ICD-10-CM | POA: Diagnosis not present

## 2020-12-20 DIAGNOSIS — I10 Essential (primary) hypertension: Secondary | ICD-10-CM | POA: Diagnosis not present

## 2020-12-20 DIAGNOSIS — Z7982 Long term (current) use of aspirin: Secondary | ICD-10-CM | POA: Diagnosis not present

## 2020-12-20 DIAGNOSIS — E785 Hyperlipidemia, unspecified: Secondary | ICD-10-CM | POA: Diagnosis not present

## 2020-12-20 DIAGNOSIS — Z9181 History of falling: Secondary | ICD-10-CM | POA: Diagnosis not present

## 2020-12-20 DIAGNOSIS — S72001D Fracture of unspecified part of neck of right femur, subsequent encounter for closed fracture with routine healing: Secondary | ICD-10-CM | POA: Diagnosis not present

## 2020-12-20 DIAGNOSIS — E871 Hypo-osmolality and hyponatremia: Secondary | ICD-10-CM | POA: Diagnosis not present

## 2020-12-21 DIAGNOSIS — E871 Hypo-osmolality and hyponatremia: Secondary | ICD-10-CM | POA: Diagnosis not present

## 2020-12-21 DIAGNOSIS — E785 Hyperlipidemia, unspecified: Secondary | ICD-10-CM | POA: Diagnosis not present

## 2020-12-21 DIAGNOSIS — Z7982 Long term (current) use of aspirin: Secondary | ICD-10-CM | POA: Diagnosis not present

## 2020-12-21 DIAGNOSIS — M1990 Unspecified osteoarthritis, unspecified site: Secondary | ICD-10-CM | POA: Diagnosis not present

## 2020-12-21 DIAGNOSIS — I10 Essential (primary) hypertension: Secondary | ICD-10-CM | POA: Diagnosis not present

## 2020-12-21 DIAGNOSIS — S72001D Fracture of unspecified part of neck of right femur, subsequent encounter for closed fracture with routine healing: Secondary | ICD-10-CM | POA: Diagnosis not present

## 2020-12-21 DIAGNOSIS — Z9181 History of falling: Secondary | ICD-10-CM | POA: Diagnosis not present

## 2020-12-23 DIAGNOSIS — I10 Essential (primary) hypertension: Secondary | ICD-10-CM | POA: Diagnosis not present

## 2020-12-23 DIAGNOSIS — M1990 Unspecified osteoarthritis, unspecified site: Secondary | ICD-10-CM | POA: Diagnosis not present

## 2020-12-23 DIAGNOSIS — E785 Hyperlipidemia, unspecified: Secondary | ICD-10-CM | POA: Diagnosis not present

## 2020-12-23 DIAGNOSIS — S72001D Fracture of unspecified part of neck of right femur, subsequent encounter for closed fracture with routine healing: Secondary | ICD-10-CM | POA: Diagnosis not present

## 2020-12-23 DIAGNOSIS — Z7982 Long term (current) use of aspirin: Secondary | ICD-10-CM | POA: Diagnosis not present

## 2020-12-23 DIAGNOSIS — Z9181 History of falling: Secondary | ICD-10-CM | POA: Diagnosis not present

## 2020-12-23 DIAGNOSIS — E871 Hypo-osmolality and hyponatremia: Secondary | ICD-10-CM | POA: Diagnosis not present

## 2020-12-27 DIAGNOSIS — E871 Hypo-osmolality and hyponatremia: Secondary | ICD-10-CM | POA: Diagnosis not present

## 2020-12-27 DIAGNOSIS — Z9181 History of falling: Secondary | ICD-10-CM | POA: Diagnosis not present

## 2020-12-27 DIAGNOSIS — E785 Hyperlipidemia, unspecified: Secondary | ICD-10-CM | POA: Diagnosis not present

## 2020-12-27 DIAGNOSIS — M1990 Unspecified osteoarthritis, unspecified site: Secondary | ICD-10-CM | POA: Diagnosis not present

## 2020-12-27 DIAGNOSIS — S72001D Fracture of unspecified part of neck of right femur, subsequent encounter for closed fracture with routine healing: Secondary | ICD-10-CM | POA: Diagnosis not present

## 2020-12-27 DIAGNOSIS — I10 Essential (primary) hypertension: Secondary | ICD-10-CM | POA: Diagnosis not present

## 2020-12-27 DIAGNOSIS — Z7982 Long term (current) use of aspirin: Secondary | ICD-10-CM | POA: Diagnosis not present

## 2020-12-28 ENCOUNTER — Encounter: Payer: Self-pay | Admitting: Orthopedic Surgery

## 2020-12-28 ENCOUNTER — Ambulatory Visit: Payer: Medicare HMO

## 2020-12-28 ENCOUNTER — Ambulatory Visit (INDEPENDENT_AMBULATORY_CARE_PROVIDER_SITE_OTHER): Payer: Medicare HMO | Admitting: Orthopedic Surgery

## 2020-12-28 ENCOUNTER — Other Ambulatory Visit: Payer: Self-pay

## 2020-12-28 DIAGNOSIS — S72001D Fracture of unspecified part of neck of right femur, subsequent encounter for closed fracture with routine healing: Secondary | ICD-10-CM

## 2020-12-28 NOTE — Progress Notes (Signed)
Orthopaedic Postop Note  Assessment: Laura Cook is a 87 y.o. female s/p CMN for a right intertrochanteric femur fracture  DOS: 11/17/20  Plan: Patient is improving.  No pain in her groin or lateral hip.  Some pain in her lower back.  Radiographs are stable.  She is healing this fracture well.  I encouraged her to continue to ambulate, with some assistance, including a walker.  Tylenol as needed for pain.  She is not working with therapy, but walking is excellent exercise.  She is only 6 weeks out from surgery, and anticipate that she will see significant improvement over the next 6 weeks.  If she has any issues, she should contact clinic.  Otherwise follow-up in 6 weeks.  Follow-up: Return in about 6 weeks (around 02/08/2021). XR at next visit: AP pelvis, R femur  Subjective:  Chief Complaint  Patient presents with   Routine Post Op    DOS 11/15/20    History of Present Illness: Laura Cook is a 87 y.o. female who presents following the above stated procedure.  She has been discharged home.  She is living with her son, who is providing assistance.  She denies pain in her groin.  No pain in the lateral hip.  She does have some pain in the lower back.  She is using a walker to assist with ambulation.  She states that this is slow, but she is able to walk.  She does not require additional pain medications at this time.  No fevers or chills.  No issues with the surgical incisions.  Review of Systems: No fevers or chills No numbness or tingling No Chest Pain No shortness of breath   Objective: There were no vitals taken for this visit.  Physical Exam:  Elderly female, seated in a wheelchair  Alert and oriented.  No acute distress  Lateral hip and thigh incisions are healing well.  No surrounding erythema or drainage.  She is able to maintain a straight leg raise.  She has no pain with axial loading.  She tolerates gentle range of motion, without pain in her groin.  Toes  are warm and well-perfused.  Sensations intact over the dorsum of the foot.  Active motion intact in the TA/EHL.  IMAGING: I personally ordered and reviewed the following images:  AP pelvis and right femur x-rays were obtained in clinic today.  These were compared to previous x-rays.  The cephalomedullary nail remains in good position.  There is been no interval displacement of the fracture.  Hardware remains in good position, without evidence of subsidence or failure.  There is some callus formation at the fracture site.  Some HO formation at the proximal aspect of the nail.  Impression: Right intertrochanteric femur fracture in excellent alignment following operative fixation.  No hardware failure.   Mordecai Rasmussen, MD 12/28/2020 9:39 AM

## 2020-12-29 DIAGNOSIS — Z7982 Long term (current) use of aspirin: Secondary | ICD-10-CM | POA: Diagnosis not present

## 2020-12-29 DIAGNOSIS — Z9181 History of falling: Secondary | ICD-10-CM | POA: Diagnosis not present

## 2020-12-29 DIAGNOSIS — E785 Hyperlipidemia, unspecified: Secondary | ICD-10-CM | POA: Diagnosis not present

## 2020-12-29 DIAGNOSIS — S72001D Fracture of unspecified part of neck of right femur, subsequent encounter for closed fracture with routine healing: Secondary | ICD-10-CM | POA: Diagnosis not present

## 2020-12-29 DIAGNOSIS — M1990 Unspecified osteoarthritis, unspecified site: Secondary | ICD-10-CM | POA: Diagnosis not present

## 2020-12-29 DIAGNOSIS — I10 Essential (primary) hypertension: Secondary | ICD-10-CM | POA: Diagnosis not present

## 2020-12-29 DIAGNOSIS — E871 Hypo-osmolality and hyponatremia: Secondary | ICD-10-CM | POA: Diagnosis not present

## 2020-12-30 DIAGNOSIS — E871 Hypo-osmolality and hyponatremia: Secondary | ICD-10-CM | POA: Diagnosis not present

## 2020-12-30 DIAGNOSIS — M1990 Unspecified osteoarthritis, unspecified site: Secondary | ICD-10-CM | POA: Diagnosis not present

## 2020-12-30 DIAGNOSIS — Z7982 Long term (current) use of aspirin: Secondary | ICD-10-CM | POA: Diagnosis not present

## 2020-12-30 DIAGNOSIS — S72001D Fracture of unspecified part of neck of right femur, subsequent encounter for closed fracture with routine healing: Secondary | ICD-10-CM | POA: Diagnosis not present

## 2020-12-30 DIAGNOSIS — E785 Hyperlipidemia, unspecified: Secondary | ICD-10-CM | POA: Diagnosis not present

## 2020-12-30 DIAGNOSIS — Z9181 History of falling: Secondary | ICD-10-CM | POA: Diagnosis not present

## 2020-12-30 DIAGNOSIS — I10 Essential (primary) hypertension: Secondary | ICD-10-CM | POA: Diagnosis not present

## 2021-01-03 DIAGNOSIS — I10 Essential (primary) hypertension: Secondary | ICD-10-CM | POA: Diagnosis not present

## 2021-01-03 DIAGNOSIS — E871 Hypo-osmolality and hyponatremia: Secondary | ICD-10-CM | POA: Diagnosis not present

## 2021-01-03 DIAGNOSIS — M1990 Unspecified osteoarthritis, unspecified site: Secondary | ICD-10-CM | POA: Diagnosis not present

## 2021-01-03 DIAGNOSIS — S72001D Fracture of unspecified part of neck of right femur, subsequent encounter for closed fracture with routine healing: Secondary | ICD-10-CM | POA: Diagnosis not present

## 2021-01-03 DIAGNOSIS — Z7982 Long term (current) use of aspirin: Secondary | ICD-10-CM | POA: Diagnosis not present

## 2021-01-03 DIAGNOSIS — Z9181 History of falling: Secondary | ICD-10-CM | POA: Diagnosis not present

## 2021-01-03 DIAGNOSIS — E785 Hyperlipidemia, unspecified: Secondary | ICD-10-CM | POA: Diagnosis not present

## 2021-01-04 ENCOUNTER — Other Ambulatory Visit (HOSPITAL_COMMUNITY): Payer: Self-pay | Admitting: Orthopedic Surgery

## 2021-01-04 DIAGNOSIS — E785 Hyperlipidemia, unspecified: Secondary | ICD-10-CM | POA: Diagnosis not present

## 2021-01-04 DIAGNOSIS — E871 Hypo-osmolality and hyponatremia: Secondary | ICD-10-CM | POA: Diagnosis not present

## 2021-01-04 DIAGNOSIS — Z7982 Long term (current) use of aspirin: Secondary | ICD-10-CM | POA: Diagnosis not present

## 2021-01-04 DIAGNOSIS — S72001D Fracture of unspecified part of neck of right femur, subsequent encounter for closed fracture with routine healing: Secondary | ICD-10-CM | POA: Diagnosis not present

## 2021-01-04 DIAGNOSIS — Z9181 History of falling: Secondary | ICD-10-CM | POA: Diagnosis not present

## 2021-01-04 DIAGNOSIS — M1990 Unspecified osteoarthritis, unspecified site: Secondary | ICD-10-CM | POA: Diagnosis not present

## 2021-01-04 DIAGNOSIS — I10 Essential (primary) hypertension: Secondary | ICD-10-CM | POA: Diagnosis not present

## 2021-01-05 DIAGNOSIS — Z9181 History of falling: Secondary | ICD-10-CM | POA: Diagnosis not present

## 2021-01-05 DIAGNOSIS — M1990 Unspecified osteoarthritis, unspecified site: Secondary | ICD-10-CM | POA: Diagnosis not present

## 2021-01-05 DIAGNOSIS — I10 Essential (primary) hypertension: Secondary | ICD-10-CM | POA: Diagnosis not present

## 2021-01-05 DIAGNOSIS — S72001D Fracture of unspecified part of neck of right femur, subsequent encounter for closed fracture with routine healing: Secondary | ICD-10-CM | POA: Diagnosis not present

## 2021-01-05 DIAGNOSIS — E871 Hypo-osmolality and hyponatremia: Secondary | ICD-10-CM | POA: Diagnosis not present

## 2021-01-05 DIAGNOSIS — E785 Hyperlipidemia, unspecified: Secondary | ICD-10-CM | POA: Diagnosis not present

## 2021-01-05 DIAGNOSIS — Z7982 Long term (current) use of aspirin: Secondary | ICD-10-CM | POA: Diagnosis not present

## 2021-01-06 DIAGNOSIS — I10 Essential (primary) hypertension: Secondary | ICD-10-CM | POA: Diagnosis not present

## 2021-01-06 DIAGNOSIS — Z7982 Long term (current) use of aspirin: Secondary | ICD-10-CM | POA: Diagnosis not present

## 2021-01-06 DIAGNOSIS — E785 Hyperlipidemia, unspecified: Secondary | ICD-10-CM | POA: Diagnosis not present

## 2021-01-06 DIAGNOSIS — M1990 Unspecified osteoarthritis, unspecified site: Secondary | ICD-10-CM | POA: Diagnosis not present

## 2021-01-06 DIAGNOSIS — E871 Hypo-osmolality and hyponatremia: Secondary | ICD-10-CM | POA: Diagnosis not present

## 2021-01-06 DIAGNOSIS — S72001D Fracture of unspecified part of neck of right femur, subsequent encounter for closed fracture with routine healing: Secondary | ICD-10-CM | POA: Diagnosis not present

## 2021-01-06 DIAGNOSIS — Z9181 History of falling: Secondary | ICD-10-CM | POA: Diagnosis not present

## 2021-01-10 DIAGNOSIS — I10 Essential (primary) hypertension: Secondary | ICD-10-CM | POA: Diagnosis not present

## 2021-01-10 DIAGNOSIS — E785 Hyperlipidemia, unspecified: Secondary | ICD-10-CM | POA: Diagnosis not present

## 2021-01-10 DIAGNOSIS — E871 Hypo-osmolality and hyponatremia: Secondary | ICD-10-CM | POA: Diagnosis not present

## 2021-01-10 DIAGNOSIS — Z7982 Long term (current) use of aspirin: Secondary | ICD-10-CM | POA: Diagnosis not present

## 2021-01-10 DIAGNOSIS — Z9181 History of falling: Secondary | ICD-10-CM | POA: Diagnosis not present

## 2021-01-10 DIAGNOSIS — S72001D Fracture of unspecified part of neck of right femur, subsequent encounter for closed fracture with routine healing: Secondary | ICD-10-CM | POA: Diagnosis not present

## 2021-01-10 DIAGNOSIS — M1990 Unspecified osteoarthritis, unspecified site: Secondary | ICD-10-CM | POA: Diagnosis not present

## 2021-01-11 DIAGNOSIS — E871 Hypo-osmolality and hyponatremia: Secondary | ICD-10-CM | POA: Diagnosis not present

## 2021-01-11 DIAGNOSIS — Z9181 History of falling: Secondary | ICD-10-CM | POA: Diagnosis not present

## 2021-01-11 DIAGNOSIS — S72001D Fracture of unspecified part of neck of right femur, subsequent encounter for closed fracture with routine healing: Secondary | ICD-10-CM | POA: Diagnosis not present

## 2021-01-11 DIAGNOSIS — Z7982 Long term (current) use of aspirin: Secondary | ICD-10-CM | POA: Diagnosis not present

## 2021-01-11 DIAGNOSIS — M1990 Unspecified osteoarthritis, unspecified site: Secondary | ICD-10-CM | POA: Diagnosis not present

## 2021-01-11 DIAGNOSIS — E785 Hyperlipidemia, unspecified: Secondary | ICD-10-CM | POA: Diagnosis not present

## 2021-01-11 DIAGNOSIS — I10 Essential (primary) hypertension: Secondary | ICD-10-CM | POA: Diagnosis not present

## 2021-01-12 DIAGNOSIS — S72001D Fracture of unspecified part of neck of right femur, subsequent encounter for closed fracture with routine healing: Secondary | ICD-10-CM | POA: Diagnosis not present

## 2021-01-12 DIAGNOSIS — I10 Essential (primary) hypertension: Secondary | ICD-10-CM | POA: Diagnosis not present

## 2021-01-12 DIAGNOSIS — M1990 Unspecified osteoarthritis, unspecified site: Secondary | ICD-10-CM | POA: Diagnosis not present

## 2021-01-12 DIAGNOSIS — E871 Hypo-osmolality and hyponatremia: Secondary | ICD-10-CM | POA: Diagnosis not present

## 2021-01-12 DIAGNOSIS — Z9181 History of falling: Secondary | ICD-10-CM | POA: Diagnosis not present

## 2021-01-12 DIAGNOSIS — Z7982 Long term (current) use of aspirin: Secondary | ICD-10-CM | POA: Diagnosis not present

## 2021-01-12 DIAGNOSIS — E785 Hyperlipidemia, unspecified: Secondary | ICD-10-CM | POA: Diagnosis not present

## 2021-01-17 DIAGNOSIS — S72001D Fracture of unspecified part of neck of right femur, subsequent encounter for closed fracture with routine healing: Secondary | ICD-10-CM | POA: Diagnosis not present

## 2021-01-17 DIAGNOSIS — I10 Essential (primary) hypertension: Secondary | ICD-10-CM | POA: Diagnosis not present

## 2021-01-17 DIAGNOSIS — M1990 Unspecified osteoarthritis, unspecified site: Secondary | ICD-10-CM | POA: Diagnosis not present

## 2021-01-17 DIAGNOSIS — Z9181 History of falling: Secondary | ICD-10-CM | POA: Diagnosis not present

## 2021-01-17 DIAGNOSIS — E871 Hypo-osmolality and hyponatremia: Secondary | ICD-10-CM | POA: Diagnosis not present

## 2021-01-17 DIAGNOSIS — E785 Hyperlipidemia, unspecified: Secondary | ICD-10-CM | POA: Diagnosis not present

## 2021-01-17 DIAGNOSIS — Z7982 Long term (current) use of aspirin: Secondary | ICD-10-CM | POA: Diagnosis not present

## 2021-01-18 DIAGNOSIS — S72001D Fracture of unspecified part of neck of right femur, subsequent encounter for closed fracture with routine healing: Secondary | ICD-10-CM | POA: Diagnosis not present

## 2021-01-18 DIAGNOSIS — E785 Hyperlipidemia, unspecified: Secondary | ICD-10-CM | POA: Diagnosis not present

## 2021-01-18 DIAGNOSIS — Z9181 History of falling: Secondary | ICD-10-CM | POA: Diagnosis not present

## 2021-01-18 DIAGNOSIS — Z7982 Long term (current) use of aspirin: Secondary | ICD-10-CM | POA: Diagnosis not present

## 2021-01-18 DIAGNOSIS — M1990 Unspecified osteoarthritis, unspecified site: Secondary | ICD-10-CM | POA: Diagnosis not present

## 2021-01-18 DIAGNOSIS — I10 Essential (primary) hypertension: Secondary | ICD-10-CM | POA: Diagnosis not present

## 2021-01-18 DIAGNOSIS — E871 Hypo-osmolality and hyponatremia: Secondary | ICD-10-CM | POA: Diagnosis not present

## 2021-01-18 DIAGNOSIS — Z515 Encounter for palliative care: Secondary | ICD-10-CM | POA: Diagnosis not present

## 2021-01-18 DIAGNOSIS — R54 Age-related physical debility: Secondary | ICD-10-CM | POA: Diagnosis not present

## 2021-01-19 DIAGNOSIS — E785 Hyperlipidemia, unspecified: Secondary | ICD-10-CM | POA: Diagnosis not present

## 2021-01-19 DIAGNOSIS — Z9181 History of falling: Secondary | ICD-10-CM | POA: Diagnosis not present

## 2021-01-19 DIAGNOSIS — R42 Dizziness and giddiness: Secondary | ICD-10-CM | POA: Diagnosis not present

## 2021-01-19 DIAGNOSIS — S72001D Fracture of unspecified part of neck of right femur, subsequent encounter for closed fracture with routine healing: Secondary | ICD-10-CM | POA: Diagnosis not present

## 2021-01-19 DIAGNOSIS — E871 Hypo-osmolality and hyponatremia: Secondary | ICD-10-CM | POA: Diagnosis not present

## 2021-01-19 DIAGNOSIS — Z7982 Long term (current) use of aspirin: Secondary | ICD-10-CM | POA: Diagnosis not present

## 2021-01-19 DIAGNOSIS — R5383 Other fatigue: Secondary | ICD-10-CM | POA: Diagnosis not present

## 2021-01-19 DIAGNOSIS — I10 Essential (primary) hypertension: Secondary | ICD-10-CM | POA: Diagnosis not present

## 2021-01-19 DIAGNOSIS — M1990 Unspecified osteoarthritis, unspecified site: Secondary | ICD-10-CM | POA: Diagnosis not present

## 2021-01-21 DIAGNOSIS — R32 Unspecified urinary incontinence: Secondary | ICD-10-CM | POA: Diagnosis not present

## 2021-01-21 DIAGNOSIS — Z0000 Encounter for general adult medical examination without abnormal findings: Secondary | ICD-10-CM | POA: Diagnosis not present

## 2021-01-21 DIAGNOSIS — E559 Vitamin D deficiency, unspecified: Secondary | ICD-10-CM | POA: Diagnosis not present

## 2021-01-21 DIAGNOSIS — I10 Essential (primary) hypertension: Secondary | ICD-10-CM | POA: Diagnosis not present

## 2021-01-21 DIAGNOSIS — Z1339 Encounter for screening examination for other mental health and behavioral disorders: Secondary | ICD-10-CM | POA: Diagnosis not present

## 2021-01-21 DIAGNOSIS — Z1331 Encounter for screening for depression: Secondary | ICD-10-CM | POA: Diagnosis not present

## 2021-01-21 DIAGNOSIS — R69 Illness, unspecified: Secondary | ICD-10-CM | POA: Diagnosis not present

## 2021-01-21 DIAGNOSIS — E7800 Pure hypercholesterolemia, unspecified: Secondary | ICD-10-CM | POA: Diagnosis not present

## 2021-01-21 DIAGNOSIS — Z79899 Other long term (current) drug therapy: Secondary | ICD-10-CM | POA: Diagnosis not present

## 2021-01-21 DIAGNOSIS — Z6821 Body mass index (BMI) 21.0-21.9, adult: Secondary | ICD-10-CM | POA: Diagnosis not present

## 2021-01-21 DIAGNOSIS — Z7189 Other specified counseling: Secondary | ICD-10-CM | POA: Diagnosis not present

## 2021-01-21 DIAGNOSIS — Z299 Encounter for prophylactic measures, unspecified: Secondary | ICD-10-CM | POA: Diagnosis not present

## 2021-01-21 DIAGNOSIS — F419 Anxiety disorder, unspecified: Secondary | ICD-10-CM | POA: Diagnosis not present

## 2021-01-25 DIAGNOSIS — E871 Hypo-osmolality and hyponatremia: Secondary | ICD-10-CM | POA: Diagnosis not present

## 2021-01-25 DIAGNOSIS — S72001D Fracture of unspecified part of neck of right femur, subsequent encounter for closed fracture with routine healing: Secondary | ICD-10-CM | POA: Diagnosis not present

## 2021-01-25 DIAGNOSIS — M1990 Unspecified osteoarthritis, unspecified site: Secondary | ICD-10-CM | POA: Diagnosis not present

## 2021-01-25 DIAGNOSIS — E785 Hyperlipidemia, unspecified: Secondary | ICD-10-CM | POA: Diagnosis not present

## 2021-01-25 DIAGNOSIS — I10 Essential (primary) hypertension: Secondary | ICD-10-CM | POA: Diagnosis not present

## 2021-01-25 DIAGNOSIS — Z7982 Long term (current) use of aspirin: Secondary | ICD-10-CM | POA: Diagnosis not present

## 2021-01-25 DIAGNOSIS — Z9181 History of falling: Secondary | ICD-10-CM | POA: Diagnosis not present

## 2021-01-26 DIAGNOSIS — Z9181 History of falling: Secondary | ICD-10-CM | POA: Diagnosis not present

## 2021-01-26 DIAGNOSIS — E871 Hypo-osmolality and hyponatremia: Secondary | ICD-10-CM | POA: Diagnosis not present

## 2021-01-26 DIAGNOSIS — E785 Hyperlipidemia, unspecified: Secondary | ICD-10-CM | POA: Diagnosis not present

## 2021-01-26 DIAGNOSIS — I10 Essential (primary) hypertension: Secondary | ICD-10-CM | POA: Diagnosis not present

## 2021-01-26 DIAGNOSIS — M1990 Unspecified osteoarthritis, unspecified site: Secondary | ICD-10-CM | POA: Diagnosis not present

## 2021-01-26 DIAGNOSIS — S72001D Fracture of unspecified part of neck of right femur, subsequent encounter for closed fracture with routine healing: Secondary | ICD-10-CM | POA: Diagnosis not present

## 2021-01-26 DIAGNOSIS — Z7982 Long term (current) use of aspirin: Secondary | ICD-10-CM | POA: Diagnosis not present

## 2021-01-31 DIAGNOSIS — Z9181 History of falling: Secondary | ICD-10-CM | POA: Diagnosis not present

## 2021-01-31 DIAGNOSIS — Z7982 Long term (current) use of aspirin: Secondary | ICD-10-CM | POA: Diagnosis not present

## 2021-01-31 DIAGNOSIS — E871 Hypo-osmolality and hyponatremia: Secondary | ICD-10-CM | POA: Diagnosis not present

## 2021-01-31 DIAGNOSIS — S72001D Fracture of unspecified part of neck of right femur, subsequent encounter for closed fracture with routine healing: Secondary | ICD-10-CM | POA: Diagnosis not present

## 2021-01-31 DIAGNOSIS — M1990 Unspecified osteoarthritis, unspecified site: Secondary | ICD-10-CM | POA: Diagnosis not present

## 2021-01-31 DIAGNOSIS — E785 Hyperlipidemia, unspecified: Secondary | ICD-10-CM | POA: Diagnosis not present

## 2021-01-31 DIAGNOSIS — I10 Essential (primary) hypertension: Secondary | ICD-10-CM | POA: Diagnosis not present

## 2021-02-01 DIAGNOSIS — M1990 Unspecified osteoarthritis, unspecified site: Secondary | ICD-10-CM | POA: Diagnosis not present

## 2021-02-01 DIAGNOSIS — Z7982 Long term (current) use of aspirin: Secondary | ICD-10-CM | POA: Diagnosis not present

## 2021-02-01 DIAGNOSIS — I10 Essential (primary) hypertension: Secondary | ICD-10-CM | POA: Diagnosis not present

## 2021-02-01 DIAGNOSIS — E871 Hypo-osmolality and hyponatremia: Secondary | ICD-10-CM | POA: Diagnosis not present

## 2021-02-01 DIAGNOSIS — S72001D Fracture of unspecified part of neck of right femur, subsequent encounter for closed fracture with routine healing: Secondary | ICD-10-CM | POA: Diagnosis not present

## 2021-02-01 DIAGNOSIS — Z9181 History of falling: Secondary | ICD-10-CM | POA: Diagnosis not present

## 2021-02-01 DIAGNOSIS — E785 Hyperlipidemia, unspecified: Secondary | ICD-10-CM | POA: Diagnosis not present

## 2021-02-07 DIAGNOSIS — Z7982 Long term (current) use of aspirin: Secondary | ICD-10-CM | POA: Diagnosis not present

## 2021-02-07 DIAGNOSIS — S72001D Fracture of unspecified part of neck of right femur, subsequent encounter for closed fracture with routine healing: Secondary | ICD-10-CM | POA: Diagnosis not present

## 2021-02-07 DIAGNOSIS — Z9181 History of falling: Secondary | ICD-10-CM | POA: Diagnosis not present

## 2021-02-07 DIAGNOSIS — M1990 Unspecified osteoarthritis, unspecified site: Secondary | ICD-10-CM | POA: Diagnosis not present

## 2021-02-07 DIAGNOSIS — E871 Hypo-osmolality and hyponatremia: Secondary | ICD-10-CM | POA: Diagnosis not present

## 2021-02-07 DIAGNOSIS — E785 Hyperlipidemia, unspecified: Secondary | ICD-10-CM | POA: Diagnosis not present

## 2021-02-07 DIAGNOSIS — I10 Essential (primary) hypertension: Secondary | ICD-10-CM | POA: Diagnosis not present

## 2021-02-08 ENCOUNTER — Ambulatory Visit: Payer: Medicare HMO

## 2021-02-08 ENCOUNTER — Other Ambulatory Visit: Payer: Self-pay

## 2021-02-08 ENCOUNTER — Ambulatory Visit (INDEPENDENT_AMBULATORY_CARE_PROVIDER_SITE_OTHER): Payer: Medicare HMO | Admitting: Orthopedic Surgery

## 2021-02-08 DIAGNOSIS — S72001D Fracture of unspecified part of neck of right femur, subsequent encounter for closed fracture with routine healing: Secondary | ICD-10-CM

## 2021-02-08 NOTE — Progress Notes (Signed)
Orthopaedic Postop Note  Assessment: Laura Cook is a 87 y.o. female s/p CMN for a right intertrochanteric femur fracture  DOS: 11/17/20  Plan: Patient's pain is improving.  Her function is improving.  She is able to stand and ambulate with the assistance of a walker.  Radiographs were reviewed in clinic today, and these were shown to the patient.  She does not have any pain.  No issues with her incisions.  Overall, she continues to improve and is healing well.  Due to difficulty getting to the appointments, she has requested no scheduled follow-up.  She stated that she will contact the clinic if she has any issues.  Because she is done so well following surgery, I am in agreement with this plan.  I have reiterated that I would like to see her if she has any questions or concerns.  Follow-up as needed.  Follow-up: Return if symptoms worsen or fail to improve. XR at next visit: AP pelvis, R femur  Subjective:  Chief Complaint  Patient presents with   Routine Post Op    Closed fracture of RT hip Follow up DOS 11/15/20    History of Present Illness: Laura Cook is a 87 y.o. female who presents following the above stated procedure.  Surgery was approximately 3 months ago.  She continues to improve.  She is at home, under the care of her son.  She is walking with the assistance of a walker.  No pain in the right hip.  Her mobility is improving.  No issues with the surgical incisions.  Review of Systems: No fevers or chills No numbness or tingling No Chest Pain No shortness of breath   Objective: There were no vitals taken for this visit.  Physical Exam:  Elderly female.  She is able to stand with minimal assistance.  She has a slow, steady gait with the assistance of a walker.  Alert and oriented.  No acute distress  Lateral hip incisions are healing well.  No surrounding erythema or drainage.  Mild tenderness to palpation in line with the incisions.  She is able to  maintain a straight leg raise.  No pain with axial loading.  No pain with gentle range of motion of the right hip.  Toes are warm and well perfused.  Active motion intact in the EHL/TA.  IMAGING: I personally ordered and reviewed the following images:  AP pelvis and right femur x-rays were obtained in clinic today.  These were compared to prior x-rays.  There is been no interval displacement at the fracture site.  No evidence of hardware failure or loosening.  Intertrochanteric femur fracture is in near-anatomic alignment.  Impression: Right intertrochanteric femur fracture in stable position, healing, without hardware failure.   Mordecai Rasmussen, MD 02/08/2021 10:54 AM

## 2021-02-09 ENCOUNTER — Encounter: Payer: Self-pay | Admitting: Orthopedic Surgery

## 2021-02-10 DIAGNOSIS — I10 Essential (primary) hypertension: Secondary | ICD-10-CM | POA: Diagnosis not present

## 2021-02-10 DIAGNOSIS — M1990 Unspecified osteoarthritis, unspecified site: Secondary | ICD-10-CM | POA: Diagnosis not present

## 2021-02-10 DIAGNOSIS — E871 Hypo-osmolality and hyponatremia: Secondary | ICD-10-CM | POA: Diagnosis not present

## 2021-02-10 DIAGNOSIS — Z9181 History of falling: Secondary | ICD-10-CM | POA: Diagnosis not present

## 2021-02-10 DIAGNOSIS — Z7982 Long term (current) use of aspirin: Secondary | ICD-10-CM | POA: Diagnosis not present

## 2021-02-10 DIAGNOSIS — S72001D Fracture of unspecified part of neck of right femur, subsequent encounter for closed fracture with routine healing: Secondary | ICD-10-CM | POA: Diagnosis not present

## 2021-02-10 DIAGNOSIS — E785 Hyperlipidemia, unspecified: Secondary | ICD-10-CM | POA: Diagnosis not present

## 2021-02-16 DIAGNOSIS — Z515 Encounter for palliative care: Secondary | ICD-10-CM | POA: Diagnosis not present

## 2021-02-16 DIAGNOSIS — R54 Age-related physical debility: Secondary | ICD-10-CM | POA: Diagnosis not present

## 2021-03-02 DIAGNOSIS — H2513 Age-related nuclear cataract, bilateral: Secondary | ICD-10-CM | POA: Diagnosis not present

## 2021-03-02 DIAGNOSIS — H40033 Anatomical narrow angle, bilateral: Secondary | ICD-10-CM | POA: Diagnosis not present

## 2021-03-09 DIAGNOSIS — R5383 Other fatigue: Secondary | ICD-10-CM | POA: Diagnosis not present

## 2021-03-09 DIAGNOSIS — Z6821 Body mass index (BMI) 21.0-21.9, adult: Secondary | ICD-10-CM | POA: Diagnosis not present

## 2021-03-09 DIAGNOSIS — M19012 Primary osteoarthritis, left shoulder: Secondary | ICD-10-CM | POA: Diagnosis not present

## 2021-03-09 DIAGNOSIS — Z299 Encounter for prophylactic measures, unspecified: Secondary | ICD-10-CM | POA: Diagnosis not present

## 2021-03-09 DIAGNOSIS — I10 Essential (primary) hypertension: Secondary | ICD-10-CM | POA: Diagnosis not present

## 2021-03-09 DIAGNOSIS — Z789 Other specified health status: Secondary | ICD-10-CM | POA: Diagnosis not present

## 2021-03-09 DIAGNOSIS — M1990 Unspecified osteoarthritis, unspecified site: Secondary | ICD-10-CM | POA: Diagnosis not present

## 2021-03-09 DIAGNOSIS — N390 Urinary tract infection, site not specified: Secondary | ICD-10-CM | POA: Diagnosis not present

## 2021-03-29 DIAGNOSIS — M79674 Pain in right toe(s): Secondary | ICD-10-CM | POA: Diagnosis not present

## 2021-03-29 DIAGNOSIS — M79671 Pain in right foot: Secondary | ICD-10-CM | POA: Diagnosis not present

## 2021-03-29 DIAGNOSIS — L03031 Cellulitis of right toe: Secondary | ICD-10-CM | POA: Diagnosis not present

## 2021-03-29 DIAGNOSIS — L600 Ingrowing nail: Secondary | ICD-10-CM | POA: Diagnosis not present

## 2021-05-03 DIAGNOSIS — R54 Age-related physical debility: Secondary | ICD-10-CM | POA: Diagnosis not present

## 2021-05-03 DIAGNOSIS — Z515 Encounter for palliative care: Secondary | ICD-10-CM | POA: Diagnosis not present

## 2021-05-31 DIAGNOSIS — R54 Age-related physical debility: Secondary | ICD-10-CM | POA: Diagnosis not present

## 2021-06-13 DIAGNOSIS — H353133 Nonexudative age-related macular degeneration, bilateral, advanced atrophic without subfoveal involvement: Secondary | ICD-10-CM | POA: Diagnosis not present

## 2021-06-13 DIAGNOSIS — H5212 Myopia, left eye: Secondary | ICD-10-CM | POA: Diagnosis not present

## 2021-06-13 DIAGNOSIS — H5201 Hypermetropia, right eye: Secondary | ICD-10-CM | POA: Diagnosis not present

## 2021-06-14 DIAGNOSIS — I739 Peripheral vascular disease, unspecified: Secondary | ICD-10-CM | POA: Diagnosis not present

## 2021-06-14 DIAGNOSIS — M79675 Pain in left toe(s): Secondary | ICD-10-CM | POA: Diagnosis not present

## 2021-06-14 DIAGNOSIS — L110 Acquired keratosis follicularis: Secondary | ICD-10-CM | POA: Diagnosis not present

## 2021-06-14 DIAGNOSIS — M79672 Pain in left foot: Secondary | ICD-10-CM | POA: Diagnosis not present

## 2021-06-14 DIAGNOSIS — M79671 Pain in right foot: Secondary | ICD-10-CM | POA: Diagnosis not present

## 2021-06-14 DIAGNOSIS — M79674 Pain in right toe(s): Secondary | ICD-10-CM | POA: Diagnosis not present

## 2021-06-28 DIAGNOSIS — N390 Urinary tract infection, site not specified: Secondary | ICD-10-CM | POA: Diagnosis not present

## 2021-06-28 DIAGNOSIS — R300 Dysuria: Secondary | ICD-10-CM | POA: Diagnosis not present

## 2021-06-28 DIAGNOSIS — Z6820 Body mass index (BMI) 20.0-20.9, adult: Secondary | ICD-10-CM | POA: Diagnosis not present

## 2021-06-28 DIAGNOSIS — R54 Age-related physical debility: Secondary | ICD-10-CM | POA: Diagnosis not present

## 2021-06-28 DIAGNOSIS — Z299 Encounter for prophylactic measures, unspecified: Secondary | ICD-10-CM | POA: Diagnosis not present

## 2021-06-28 DIAGNOSIS — M1990 Unspecified osteoarthritis, unspecified site: Secondary | ICD-10-CM | POA: Diagnosis not present

## 2021-06-28 DIAGNOSIS — I10 Essential (primary) hypertension: Secondary | ICD-10-CM | POA: Diagnosis not present

## 2021-06-30 ENCOUNTER — Other Ambulatory Visit: Payer: Self-pay

## 2021-06-30 ENCOUNTER — Emergency Department (HOSPITAL_COMMUNITY): Payer: Medicare Other

## 2021-06-30 ENCOUNTER — Emergency Department (HOSPITAL_COMMUNITY)
Admission: EM | Admit: 2021-06-30 | Discharge: 2021-06-30 | Disposition: A | Discharge status: Home / Self Care | Payer: Medicare Other | Attending: Emergency Medicine | Admitting: Emergency Medicine

## 2021-06-30 ENCOUNTER — Encounter (HOSPITAL_COMMUNITY): Payer: Self-pay | Admitting: *Deleted

## 2021-06-30 DIAGNOSIS — I10 Essential (primary) hypertension: Secondary | ICD-10-CM | POA: Insufficient documentation

## 2021-06-30 DIAGNOSIS — N939 Abnormal uterine and vaginal bleeding, unspecified: Secondary | ICD-10-CM | POA: Insufficient documentation

## 2021-06-30 DIAGNOSIS — Z299 Encounter for prophylactic measures, unspecified: Secondary | ICD-10-CM | POA: Diagnosis not present

## 2021-06-30 DIAGNOSIS — N814 Uterovaginal prolapse, unspecified: Secondary | ICD-10-CM

## 2021-06-30 DIAGNOSIS — N8110 Cystocele, unspecified: Secondary | ICD-10-CM | POA: Diagnosis not present

## 2021-06-30 DIAGNOSIS — Z6820 Body mass index (BMI) 20.0-20.9, adult: Secondary | ICD-10-CM | POA: Diagnosis not present

## 2021-06-30 DIAGNOSIS — Z79899 Other long term (current) drug therapy: Secondary | ICD-10-CM | POA: Insufficient documentation

## 2021-06-30 DIAGNOSIS — E876 Hypokalemia: Secondary | ICD-10-CM | POA: Diagnosis not present

## 2021-06-30 DIAGNOSIS — N8111 Cystocele, midline: Secondary | ICD-10-CM | POA: Diagnosis not present

## 2021-06-30 DIAGNOSIS — I700 Atherosclerosis of aorta: Secondary | ICD-10-CM | POA: Diagnosis not present

## 2021-06-30 DIAGNOSIS — N3001 Acute cystitis with hematuria: Secondary | ICD-10-CM | POA: Diagnosis not present

## 2021-06-30 DIAGNOSIS — K8020 Calculus of gallbladder without cholecystitis without obstruction: Secondary | ICD-10-CM | POA: Diagnosis not present

## 2021-06-30 DIAGNOSIS — K5730 Diverticulosis of large intestine without perforation or abscess without bleeding: Secondary | ICD-10-CM | POA: Diagnosis not present

## 2021-06-30 DIAGNOSIS — R319 Hematuria, unspecified: Secondary | ICD-10-CM | POA: Insufficient documentation

## 2021-06-30 DIAGNOSIS — N200 Calculus of kidney: Secondary | ICD-10-CM | POA: Diagnosis not present

## 2021-06-30 DIAGNOSIS — N3090 Cystitis, unspecified without hematuria: Secondary | ICD-10-CM

## 2021-06-30 DIAGNOSIS — Z7982 Long term (current) use of aspirin: Secondary | ICD-10-CM | POA: Insufficient documentation

## 2021-06-30 LAB — COMPREHENSIVE METABOLIC PANEL
ALT: 14 U/L (ref 0–44)
AST: 18 U/L (ref 15–41)
Albumin: 4.2 g/dL (ref 3.5–5.0)
Alkaline Phosphatase: 65 U/L (ref 38–126)
Anion gap: 8 (ref 5–15)
BUN: 26 mg/dL — ABNORMAL HIGH (ref 8–23)
CO2: 27 mmol/L (ref 22–32)
Calcium: 9.7 mg/dL (ref 8.9–10.3)
Chloride: 97 mmol/L — ABNORMAL LOW (ref 98–111)
Creatinine, Ser: 0.48 mg/dL (ref 0.44–1.00)
GFR, Estimated: >60 mL/min (ref >60)
Glucose, Bld: 100 mg/dL — ABNORMAL HIGH (ref 70–99)
Potassium: 3 mmol/L — ABNORMAL LOW (ref 3.5–5.1)
Sodium: 132 mmol/L — ABNORMAL LOW (ref 135–145)
Total Bilirubin: 0.6 mg/dL (ref 0.3–1.2)
Total Protein: 6.8 g/dL (ref 6.5–8.1)

## 2021-06-30 LAB — URINALYSIS, ROUTINE W REFLEX MICROSCOPIC
Bilirubin Urine: NEGATIVE
Glucose, UA: NEGATIVE mg/dL
Nitrite: NEGATIVE

## 2021-06-30 LAB — CBC WITH DIFFERENTIAL/PLATELET
Abs Immature Granulocytes: 0.04 10*3/uL (ref 0.00–0.07)
Basophils Absolute: 0.1 10*3/uL (ref 0.0–0.1)
Basophils Relative: 1 %
Eosinophils Absolute: 0.1 10*3/uL (ref 0.0–0.5)
Eosinophils Relative: 2 %
HCT: 36 % (ref 36.0–46.0)
Hemoglobin: 12.1 g/dL (ref 12.0–15.0)
Immature Granulocytes: 1 %
Lymphocytes Relative: 18 %
Lymphs Abs: 1.1 10*3/uL (ref 0.7–4.0)
MCH: 29.7 pg (ref 26.0–34.0)
MCHC: 33.6 g/dL (ref 30.0–36.0)
MCV: 88.5 fL (ref 80.0–100.0)
Monocytes Absolute: 0.6 10*3/uL (ref 0.1–1.0)
Monocytes Relative: 10 %
Neutro Abs: 4.5 10*3/uL (ref 1.7–7.7)
Neutrophils Relative %: 68 %
Platelets: 272 10*3/uL (ref 150–400)
RBC: 4.07 MIL/uL (ref 3.87–5.11)
RDW: 14.1 % (ref 11.5–15.5)
WBC: 6.5 10*3/uL (ref 4.0–10.5)
nRBC: 0 % (ref 0.0–0.2)

## 2021-06-30 LAB — URINALYSIS, MICROSCOPIC (REFLEX)
Bacteria, UA: NONE SEEN
RBC / HPF: >50 RBC/hpf (ref 0–5)
Squamous Epithelial / HPF: NONE SEEN (ref 0–5)
WBC, UA: >50 WBC/hpf (ref 0–5)

## 2021-06-30 MED ORDER — IOHEXOL 300 MG/ML  SOLN
75.0000 mL | Freq: Once | INTRAMUSCULAR | Status: AC | PRN
  Administered 2021-06-30: 75 mL via INTRAVENOUS

## 2021-06-30 MED ORDER — SULFAMETHOXAZOLE-TRIMETHOPRIM 800-160 MG PO TABS: 1.0000 | ORAL_TABLET | Freq: Two times a day (BID) | ORAL | 0 refills | Status: AC

## 2021-06-30 MED ORDER — POTASSIUM CHLORIDE CRYS ER 20 MEQ PO TBCR
40.0000 meq | EXTENDED_RELEASE_TABLET | Freq: Once | ORAL | Status: AC
  Administered 2021-06-30: 40 meq via ORAL
  Filled 2021-06-30: qty 2

## 2021-06-30 MED ORDER — SODIUM CHLORIDE 0.9 % IV SOLN
1.0000 g | Freq: Once | INTRAVENOUS | Status: AC
  Administered 2021-06-30: 1 g via INTRAVENOUS
  Filled 2021-06-30: qty 10

## 2021-06-30 NOTE — ED Provider Notes (Signed)
Patient has on CT scan communication between her bladder and her vagina.  I spoke with urology Dr. Milford Cage and he stated we can put the patient on antibiotics and they will follow-up with her in the office.  He is recommending the patient sees Dr. Nicki Reaper MacDiarmid ?  ?Milton Ferguson, MD ?06/30/21 1812 ? ?

## 2021-06-30 NOTE — Discharge Instructions (Signed)
Call make an appointment with Dr. Bjorn Loser in the next couple weeks ?

## 2021-06-30 NOTE — ED Triage Notes (Signed)
Pt c/o vaginal bleeding for the last couple of days;  ?

## 2021-06-30 NOTE — ED Notes (Signed)
Pt to CT

## 2021-06-30 NOTE — ED Notes (Signed)
When provider in to discuss plan of care, showed provider soiled pads/diapers- leaking urine/bloody ?

## 2021-06-30 NOTE — ED Notes (Signed)
Pt's son just went home, number on file ?

## 2021-06-30 NOTE — ED Notes (Signed)
To CT

## 2021-06-30 NOTE — ED Notes (Signed)
[facility]- adult diaper changed and pads changed.  ? ?Pt taken back to CT ?

## 2021-06-30 NOTE — ED Provider Notes (Addendum)
[facility] EMERGENCY DEPARTMENT Provider Note   CSN: [id number] Arrival date & time: 06/30/21  0957     History  Chief Complaint  Patient presents with   Vaginal Bleeding    Laura Cook is a 88 y.o. female.  Patient sent over from primary care provider thinks had vaginal bleeding for couple weeks.  But past medical history indicates there was hysterectomy and patient thinks there was hysterectomy as well.  This may very well be hematuria.  Patient did urinate here but urine did appear to be fairly bloody.  I think the primary care doctor knew this because there was talk he was trying to get her to follow-up with urology.  And could not get a quick appointment.  Patient states that she has had a bulge which she was told was her bladder bulging in that area now for a period of time.  But the bleeding just started a couple days ago.  Patient did arrive with her son.  Patient denies any pain.  No fevers.  No dysuria.  Patient is not on blood thinners  Past medical history is significant for hypertension past surgical history is significant for intertrochanteric fracture and repair in September 2022 abdominal hysterectomy.  Patient never smoked.  Patient's vital signs on arrival temp normal no fevers patient without any dysuria.  Heart rate 72 blood pressure 160/74 oxygen sats 97% respiratory rate 18.      Home Medications Prior to Admission medications   Medication Sig Start Date End Date Taking? Authorizing Provider  acetaminophen (TYLENOL) 325 MG tablet Take 2 tablets (650 mg total) by mouth every 6 (six) hours as needed for mild pain (or Fever >/= 101). 11/18/20   Emokpae, Courage, MD  amLODipine (NORVASC) 5 MG tablet Take 5 mg by mouth daily. 11/11/20   [provider]  aspirin EC 325 MG tablet Take 1 tablet (325 mg total) by mouth daily with breakfast. 11/18/20   Shon Hale, MD  baclofen (LIORESAL) 10 MG tablet Take 10 mg by mouth 2 (two) times daily. 06/28/21    [provider]  hydrochlorothiazide (HYDRODIURIL) 50 MG tablet Take 50 mg by mouth every morning. 05/06/21   [provider]  HYDROcodone-acetaminophen (NORCO) 5-325 MG tablet Take 1 tablet by mouth every 4 (four) hours as needed for moderate pain. 11/18/20   Shon Hale, MD  LORazepam (ATIVAN) 0.5 MG tablet Take 1 tablet (0.5 mg total) by mouth See admin instructions. Take 1 tablet every morning and 1 tablet at nighttime 11/18/20 11/18/21  Shon Hale, MD  meclizine (ANTIVERT) 25 MG tablet Take 25 mg by mouth every 8 (eight) hours as needed. 07/01/20   [provider]  methocarbamol (ROBAXIN) 500 MG tablet Take 1 tablet (500 mg total) by mouth 3 (three) times daily. Patient not taking: Reported on 06/30/2021 11/18/20   Shon Hale, MD  metoprolol succinate (TOPROL XL) 50 MG 24 hr tablet Take 1 tablet (50 mg total) by mouth daily. Take with or immediately following a meal. 11/18/20 11/18/21  Emokpae, Courage, MD  pantoprazole (PROTONIX) 40 MG tablet Take 1 tablet (40 mg total) by mouth daily. Patient not taking: Reported on 06/30/2021 11/19/20   Shon Hale, MD  polyethylene glycol (MIRALAX) 17 g packet Take 17 g by mouth daily. 11/18/20   Emokpae, Courage, MD  RESTASIS 0.05 % ophthalmic emulsion 1 drop 2 (two) times daily. 11/08/20   [provider]  rosuvastatin (CRESTOR) 10 MG tablet Take 10 mg by mouth at bedtime.  05/14/21   [provider]      Allergies    Patient has no known allergies.    Review of Systems   Review of Systems  Constitutional:  Negative for chills and fever.  HENT:  Negative for ear pain and sore throat.   Eyes:  Negative for pain and visual disturbance.  Respiratory:  Negative for cough and shortness of breath.   Cardiovascular:  Negative for chest pain and palpitations.  Gastrointestinal:  Negative for abdominal pain and vomiting.  Genitourinary:  Positive for hematuria. Negative for dysuria.  Musculoskeletal:   Negative for arthralgias and back pain.  Skin:  Negative for color change and rash.  Neurological:  Negative for seizures and syncope.  All other systems reviewed and are negative.  Physical Exam Updated Vital Signs BP (!) 155/68   Pulse 69   Temp (!) 97 F (36.1 C) (Oral)   Resp 15   Ht 1.549 m (5\' 1" )   Wt 45.4 kg   SpO2 97%   BMI 18.89 kg/m  Physical Exam Vitals and nursing note reviewed.  Constitutional:      General: She is not in acute distress.    Appearance: Normal appearance. She is well-developed.  HENT:     Head: Normocephalic and atraumatic.  Eyes:     Extraocular Movements: Extraocular movements intact.     Conjunctiva/sclera: Conjunctivae normal.     Pupils: Pupils are equal, round, and reactive to light.  Cardiovascular:     Rate and Rhythm: Normal rate and regular rhythm.     Heart sounds: No murmur heard. Pulmonary:     Effort: Pulmonary effort is normal. No respiratory distress.     Breath sounds: Normal breath sounds.  Abdominal:     Palpations: Abdomen is soft.     Tenderness: There is no abdominal tenderness.  Genitourinary:    Comments: Vaginal vault without any blood.  But there was a significant cystocele.  Her prolapse of the bladder and soft tissues into the vaginal area and a little bit of out of the introitus.  Again no vaginal bleeding.  But when I reduce the the cystocele.  Urine did come out of the urethra and it was bloody. Musculoskeletal:        General: No swelling.     Cervical back: Normal range of motion and neck supple.  Skin:    General: Skin is warm and dry.     Capillary Refill: Capillary refill takes less than 2 seconds.  Neurological:     General: No focal deficit present.     Mental Status: She is alert. Mental status is at baseline.  Psychiatric:        Mood and Affect: Mood normal.    ED Results / Procedures / Treatments   Labs (all labs ordered are listed, but only abnormal results are displayed) Labs Reviewed   COMPREHENSIVE METABOLIC PANEL - Abnormal; Notable for the following components:      Result Value   Sodium 132 (*)    Potassium 3.0 (*)    Chloride 97 (*)    Glucose, Bld 100 (*)    BUN 26 (*)    All other components within normal limits  URINALYSIS, ROUTINE W REFLEX MICROSCOPIC - Abnormal; Notable for the following components:   Color, Urine RED (*)    APPearance TURBID (*)    Hgb urine dipstick   (*)    Value: TEST NOT REPORTED DUE TO COLOR INTERFERENCE OF URINE PIGMENT  Ketones, ur   (*)    Value: TEST NOT REPORTED DUE TO COLOR INTERFERENCE OF URINE PIGMENT   Protein, ur   (*)    Value: TEST NOT REPORTED DUE TO COLOR INTERFERENCE OF URINE PIGMENT   Leukocytes,Ua   (*)    Value: TEST NOT REPORTED DUE TO COLOR INTERFERENCE OF URINE PIGMENT   All other components within normal limits  CBC WITH DIFFERENTIAL/PLATELET  URINALYSIS, MICROSCOPIC (REFLEX)    EKG None  Radiology CT Renal Stone Study  Result Date: 06/30/2021 CLINICAL DATA:  Hematuria. History of nephrolithiasis. Hypertension. EXAM: CT ABDOMEN AND PELVIS WITHOUT CONTRAST TECHNIQUE: Multidetector CT imaging of the abdomen and pelvis was performed following the standard protocol without IV contrast. RADIATION DOSE REDUCTION: This exam was performed according to the departmental dose-optimization program which includes automated exposure control, adjustment of the mA and/or kV according to patient size and/or use of iterative reconstruction technique. COMPARISON:  None Available. FINDINGS: Lower chest: Mild motion degradation. Grossly clear lung bases. Cardiomegaly, accentuated by a pectus excavatum deformity. Moderate to large hiatal hernia, with greater than 1/2 of the stomach positioned in the lower chest. Multivessel coronary artery atherosclerosis. Hepatobiliary: Normal noncontrast appearance of the liver. A 2.2 cm gallstone without acute cholecystitis or biliary duct dilatation. Pancreas: Pancreatic atrophy is within normal  variation for age. There may be a 9 mm pancreatic cystic lesion within the body on 24/2, but this is of doubtful clinical significance given patient age. No peripancreatic inflammation. Spleen: Normal in size, without focal abnormality. Adrenals/Urinary Tract: Mild bilateral adrenal thickening. Upper pole right renal 3.2 cm lesion measures greater than fluid density on 18/2. Low-density renal lesions are likely cysts including at up to 4.0 cm exophytic off the lower pole left kidney. Right renal vascular calcifications. Left renal collecting system calculi versus renal vascular calcifications. Example interpolar left kidney at 2 mm. No hydronephrosis. The ureters are difficult to follow, but there is no gross hydroureter. Dependent hyperdense material within the left side of the bladder on 60/2 is presumably hemorrhage. Anterior bladder wall irregularity is secondary to small saccules. Stomach/Bowel: Normal remainder of the stomach. Extensive colonic diverticulosis. Colonic stool burden suggests constipation. Normal small bowel. Vascular/Lymphatic: Advanced aortic and branch vessel atherosclerosis. No abdominopelvic adenopathy. Reproductive: Artifact degraded evaluation of the pelvis from proximal femur fixation bilaterally. No adnexal mass. Possible soft tissue fullness within the uterine body and fundus with central hypoattenuation including on sagittal image 58. Other: Pelvic floor laxity. No significant free fluid. No free intraperitoneal air. Musculoskeletal: Advanced osteopenia. Lumbosacral spondylosis. Moderate convex right lumbar spine curvature IMPRESSION: 1. Left nephrolithiasis versus renal vascular calcifications. No obstructive uropathy. 2. Multifactorial degradation, primarily involving the pelvis, as detailed above. 3. Hyperattenuating material in the dependent bladder is likely hemorrhage. Bladder saccules suggesting a component of outlet obstruction. Limited evaluation for underlying bladder lesion.  4. Cholelithiasis 5. Possible soft tissue fullness and heterogeneity within the uterine body and fundus. Suboptimally evaluated secondary to artifact within the pelvis. Correlate with postmenopausal bleeding and possibly pelvic ultrasound. Of note, the clinical history describes prior hysterectomy. If the patient is truly status post hysterectomy, this soft tissue fullness could alternatively represent neoplasm either exophytic off or adjacent to the urinary bladder. Consider contrast enhanced pelvic CT. 6.  Possible constipation. 7. Hiatal hernia 8. Upper pole right renal lesion is technically indeterminate for hemorrhagic cyst versus solid neoplasm. Given size and patient age, of doubtful clinical significance. If further evaluation is desired, renal ultrasound could be performed. Electronically Signed  By: Jeronimo Greaves M.D.   On: 06/30/2021 14:46    Procedures Procedures    Medications Ordered in ED Medications  cefTRIAXone (ROCEPHIN) 1 g in sodium chloride 0.9 % 100 mL IVPB (has no administration in time range)  potassium chloride SA (KLOR-CON M) CR tablet 40 mEq (40 mEq Oral Given 06/30/21 1359)    ED Course/ Medical Decision Making/ A&P                           Medical Decision Making Amount and/or Complexity of Data Reviewed Labs: ordered. Radiology: ordered.  Risk Prescription drug management.   Patient's white count normal hemoglobin normal at 12.1.  Platelets are normal.  Complete metabolic panel had some abnormalities where the potassium was low at 3 patient given some oral potassium.  Sodium 132 BUN 26 creatinine was 0.48 but GFR shows that renal function is greater than 60.  Urine had a lot of blood in it.  Also had a lot of white blood cells.  No bacteria.  CT renal study was done to evaluate for the hematuria.  And this raise some concerns there was hyperattenuating tear on the independent bladder likely hemorrhage there is some bladder saccules suggesting a component of  outlet obstruction.  Limit evaluation for underlying bladder lesion.  There was cholelithiasis but no signs of cholecystitis.  Possible soft tissue fullness and heterogenicity within the uterine body and fundus but patient states that she had a hysterectomy so some of this may not be the case.  Is also possible that soft tissue fullness: Internally represent neoplasm either exophytic off or adjacent to the urinary bladder consider contrast-enhanced pelvic CT.  Based on that we will go ahead and order this.  Pelvic CT with IV contrast.  We will send patient's urine for culture.  We will start her on Rocephin for possible urinary tract infection due to the hematuria.  Once the CT results are back would discuss with on-call urology.  We will turn patient over to Dr. Estell Harpin who will follow-up on the CT results and discussed with urology.   Final Clinical Impression(s) / ED Diagnoses Final diagnoses:  Hematuria, unspecified type  Cystocele with prolapse    Rx / DC Orders ED Discharge Orders     None         Vanetta Mulders, MD 06/30/21 1525    Vanetta Mulders, MD 06/30/21 1526

## 2021-07-01 DIAGNOSIS — I10 Essential (primary) hypertension: Secondary | ICD-10-CM | POA: Diagnosis not present

## 2021-07-01 DIAGNOSIS — Z789 Other specified health status: Secondary | ICD-10-CM | POA: Diagnosis not present

## 2021-07-01 DIAGNOSIS — Z6820 Body mass index (BMI) 20.0-20.9, adult: Secondary | ICD-10-CM | POA: Diagnosis not present

## 2021-07-01 DIAGNOSIS — Z299 Encounter for prophylactic measures, unspecified: Secondary | ICD-10-CM | POA: Diagnosis not present

## 2021-07-02 LAB — URINE CULTURE

## 2021-07-06 ENCOUNTER — Encounter: Payer: Self-pay | Admitting: Urology

## 2021-07-06 ENCOUNTER — Ambulatory Visit: Payer: Medicare Other | Admitting: Urology

## 2021-07-06 VITALS — BP 130/75 | HR 74

## 2021-07-06 DIAGNOSIS — R310 Gross hematuria: Secondary | ICD-10-CM | POA: Diagnosis not present

## 2021-07-06 NOTE — Patient Instructions (Signed)

## 2021-07-06 NOTE — Progress Notes (Signed)
07/06/2021 10:55 AM   Laura Cook [birth year] [id number]  Referring provider: Monico Blitz, MD [address] [place],  [place] [address]  Gross hematuria   HPI: Ms Laura Cook is a [phone] here for evaluation of gross hematuria. She developed gross painful urination 5/10 and presented to the ED. Urine culture from 5/11 grew multiple species. She was started on bactrim DS an d the hematuria and dysuria resolved. UA today shows no blood. She does not get recurrent UTIs. No significant LUTS. No other complaints today   PMH: Past Medical History:  Diagnosis Date   Hypertension     Surgical History: Past Surgical History:  Procedure Laterality Date   ABDOMINAL HYSTERECTOMY     DILATION AND CURETTAGE OF UTERUS     HIP FRACTURE SURGERY Left    INTRAMEDULLARY (IM) NAIL INTERTROCHANTERIC Right 11/15/2020   Procedure: INTRAMEDULLARY (IM) NAIL INTERTROCHANTRIC;  Surgeon: Mordecai Rasmussen, MD;  Location: [facility];  Service: Orthopedics;  Laterality: Right;   YAG LASER APPLICATION Left 84/69/6295   Procedure: YAG LASER APPLICATION;  Surgeon: Williams Che, MD;  Location: [facility];  Service: Ophthalmology;  Laterality: Left;    Home Medications:  Allergies as of 07/06/2021   No Known Allergies      Medication List        Accurate as of Jul 06, 2021 10:55 AM. If you have any questions, ask your nurse or doctor.          acetaminophen 325 MG tablet Commonly known as: TYLENOL Take 2 tablets (650 mg total) by mouth every 6 (six) hours as needed for mild pain (or Fever >/= 101).   amLODipine 5 MG tablet Commonly known as: NORVASC Take 5 mg by mouth daily.   aspirin EC 325 MG tablet Take 1 tablet (325 mg total) by mouth daily with breakfast.   baclofen 10 MG tablet Commonly known as: LIORESAL Take 10 mg by mouth 2 (two) times daily.   cephALEXin 500 MG capsule Commonly known as: KEFLEX Take 500 mg by mouth 2 (two) times daily.   diclofenac Sodium 1 % Gel Commonly known as:  VOLTAREN Apply 2 g topically 2 (two) times daily.   hydrochlorothiazide 50 MG tablet Commonly known as: HYDRODIURIL Take 50 mg by mouth every morning.   HYDROcodone-acetaminophen 5-325 MG tablet Commonly known as: Norco Take 1 tablet by mouth every 4 (four) hours as needed for moderate pain.   LORazepam 0.5 MG tablet Commonly known as: Ativan Take 1 tablet (0.5 mg total) by mouth See admin instructions. Take 1 tablet every morning and 1 tablet at nighttime   meclizine 25 MG tablet Commonly known as: ANTIVERT Take 25 mg by mouth every 8 (eight) hours as needed.   methocarbamol 500 MG tablet Commonly known as: ROBAXIN Take 1 tablet (500 mg total) by mouth 3 (three) times daily.   metoprolol succinate 50 MG 24 hr tablet Commonly known as: Toprol XL Take 1 tablet (50 mg total) by mouth daily. Take with or immediately following a meal.   omeprazole 20 MG capsule Commonly known as: PRILOSEC Take 20 mg by mouth daily.   pantoprazole 40 MG tablet Commonly known as: PROTONIX Take 1 tablet (40 mg total) by mouth daily.   polyethylene glycol 17 g packet Commonly known as: MiraLax Take 17 g by mouth daily.   rosuvastatin 10 MG tablet Commonly known as: CRESTOR Take 10 mg by mouth at bedtime.   sulfamethoxazole-trimethoprim 800-160 MG tablet Commonly known as: BACTRIM DS Take  1 tablet by mouth 2 (two) times daily for 7 days.   VITAMIN D3 PO Take by mouth.        Allergies: No Known Allergies  Family History: No family history on file.  Social History:  reports that she has never smoked. She has never been exposed to tobacco smoke. She has never used smokeless tobacco. She reports that she does not drink alcohol and does not use drugs.  ROS: All other review of systems were reviewed and are negative except what is noted above in HPI  Physical Exam: BP 130/75   Pulse 74   Constitutional:  Alert and oriented, No acute distress. HEENT: [place] AT, moist mucus membranes.   Trachea midline, no masses. Cardiovascular: No clubbing, cyanosis, or edema. Respiratory: Normal respiratory effort, no increased work of breathing. GI: Abdomen is soft, nontender, nondistended, no abdominal masses GU: No CVA tenderness.  Lymph: No cervical or inguinal lymphadenopathy. Skin: No rashes, bruises or suspicious lesions. Neurologic: Grossly intact, no focal deficits, moving all 4 extremities. Psychiatric: Normal mood and affect.  Laboratory Data: Lab Results  Component Value Date   WBC 6.5 06/30/2021   HGB 12.1 06/30/2021   HCT 36.0 06/30/2021   MCV 88.5 06/30/2021   PLT 272 06/30/2021    Lab Results  Component Value Date   CREATININE 0.48 06/30/2021    No results found for: PSA  No results found for: TESTOSTERONE  No results found for: HGBA1C  Urinalysis    Component Value Date/Time   COLORURINE RED (A) 06/30/2021 1201   APPEARANCEUR TURBID (A) 06/30/2021 1201   LABSPEC  06/30/2021 1201    TEST NOT REPORTED DUE TO COLOR INTERFERENCE OF URINE PIGMENT   PHURINE  06/30/2021 1201    TEST NOT REPORTED DUE TO COLOR INTERFERENCE OF URINE PIGMENT   GLUCOSEU NEGATIVE 06/30/2021 1201   HGBUR (A) 06/30/2021 1201    TEST NOT REPORTED DUE TO COLOR INTERFERENCE OF URINE PIGMENT   BILIRUBINUR NEGATIVE 06/30/2021 1201   KETONESUR (A) 06/30/2021 1201    TEST NOT REPORTED DUE TO COLOR INTERFERENCE OF URINE PIGMENT   PROTEINUR (A) 06/30/2021 1201    TEST NOT REPORTED DUE TO COLOR INTERFERENCE OF URINE PIGMENT   NITRITE NEGATIVE 06/30/2021 1201   LEUKOCYTESUR (A) 06/30/2021 1201    TEST NOT REPORTED DUE TO COLOR INTERFERENCE OF URINE PIGMENT    Lab Results  Component Value Date   BACTERIA NONE SEEN 06/30/2021    Pertinent Imaging:  No results found for this or any previous visit.  No results found for this or any previous visit.  No results found for this or any previous visit.  No results found for this or any previous visit.  No results found for this or  any previous visit.  No results found for this or any previous visit.  No results found for this or any previous visit.  Results for orders placed during the hospital encounter of 06/30/21  CT Renal Stone Study  Narrative CLINICAL DATA:  Hematuria. History of nephrolithiasis. Hypertension.  EXAM: CT ABDOMEN AND PELVIS WITHOUT CONTRAST  TECHNIQUE: Multidetector CT imaging of the abdomen and pelvis was performed following the standard protocol without IV contrast.  RADIATION DOSE REDUCTION: This exam was performed according to the departmental dose-optimization program which includes automated exposure control, adjustment of the mA and/or kV according to patient size and/or use of iterative reconstruction technique.  COMPARISON:  None Available.  FINDINGS: Lower chest: Mild motion degradation. Grossly clear lung bases. Cardiomegaly,  accentuated by a pectus excavatum deformity. Moderate to large hiatal hernia, with greater than 1/2 of the stomach positioned in the lower chest. Multivessel coronary artery atherosclerosis.  Hepatobiliary: Normal noncontrast appearance of the liver. A 2.2 cm gallstone without acute cholecystitis or biliary duct dilatation.  Pancreas: Pancreatic atrophy is within normal variation for age. There may be a 9 mm pancreatic cystic lesion within the body on 24/2, but this is of doubtful clinical significance given patient age. No peripancreatic inflammation.  Spleen: Normal in size, without focal abnormality.  Adrenals/Urinary Tract: Mild bilateral adrenal thickening.  Upper pole right renal 3.2 cm lesion measures greater than fluid density on 18/2.  Low-density renal lesions are likely cysts including at up to 4.0 cm exophytic off the lower pole left kidney.  Right renal vascular calcifications. Left renal collecting system calculi versus renal vascular calcifications. Example interpolar left kidney at 2 mm.  No hydronephrosis. The ureters  are difficult to follow, but there is no gross hydroureter.  Dependent hyperdense material within the left side of the bladder on 60/2 is presumably hemorrhage. Anterior bladder wall irregularity is secondary to small saccules.  Stomach/Bowel: Normal remainder of the stomach. Extensive colonic diverticulosis. Colonic stool burden suggests constipation. Normal small bowel.  Vascular/Lymphatic: Advanced aortic and branch vessel atherosclerosis. No abdominopelvic adenopathy.  Reproductive: Artifact degraded evaluation of the pelvis from proximal femur fixation bilaterally. No adnexal mass. Possible soft tissue fullness within the uterine body and fundus with central hypoattenuation including on sagittal image 58.  Other: Pelvic floor laxity. No significant free fluid. No free intraperitoneal air.  Musculoskeletal: Advanced osteopenia. Lumbosacral spondylosis. Moderate convex right lumbar spine curvature  IMPRESSION: 1. Left nephrolithiasis versus renal vascular calcifications. No obstructive uropathy. 2. Multifactorial degradation, primarily involving the pelvis, as detailed above. 3. Hyperattenuating material in the dependent bladder is likely hemorrhage. Bladder saccules suggesting a component of outlet obstruction. Limited evaluation for underlying bladder lesion. 4. Cholelithiasis 5. Possible soft tissue fullness and heterogeneity within the uterine body and fundus. Suboptimally evaluated secondary to artifact within the pelvis. Correlate with postmenopausal bleeding and possibly pelvic ultrasound. Of note, the clinical history describes prior hysterectomy. If the patient is truly status post hysterectomy, this soft tissue fullness could alternatively represent neoplasm either exophytic off or adjacent to the urinary bladder. Consider contrast enhanced pelvic CT. 6.  Possible constipation. 7. Hiatal hernia 8. Upper pole right renal lesion is technically indeterminate  for hemorrhagic cyst versus solid neoplasm. Given size and patient age, of doubtful clinical significance. If further evaluation is desired, renal ultrasound could be performed.   Electronically Signed By: Abigail Miyamoto M.D. On: 06/30/2021 14:46   Assessment & Plan:    1. Gross hematuria -Hematuria was related to a UTI and urine today shows no RBCs. She does not require hematuria workup at this time. RTC 6 months with UA - Urinalysis, Routine w reflex microscopic   No follow-ups on file.  Nicolette Bang, MD  [facility] [place]

## 2021-07-12 LAB — URINALYSIS, ROUTINE W REFLEX MICROSCOPIC
Bilirubin, UA: NEGATIVE
Glucose, UA: NEGATIVE
Ketones, UA: NEGATIVE
Leukocytes,UA: NEGATIVE
Nitrite, UA: NEGATIVE
RBC, UA: NEGATIVE
Specific Gravity, UA: 1.02 (ref 1.005–1.030)
Urobilinogen, Ur: 1 mg/dL (ref 0.2–1.0)
pH, UA: 7 (ref 5.0–7.5)

## 2021-07-25 DIAGNOSIS — Z299 Encounter for prophylactic measures, unspecified: Secondary | ICD-10-CM | POA: Diagnosis not present

## 2021-07-25 DIAGNOSIS — I10 Essential (primary) hypertension: Secondary | ICD-10-CM | POA: Diagnosis not present

## 2021-07-25 DIAGNOSIS — R5383 Other fatigue: Secondary | ICD-10-CM | POA: Diagnosis not present

## 2021-07-25 DIAGNOSIS — M25519 Pain in unspecified shoulder: Secondary | ICD-10-CM | POA: Diagnosis not present

## 2021-07-28 DIAGNOSIS — R54 Age-related physical debility: Secondary | ICD-10-CM | POA: Diagnosis not present

## 2021-08-15 DIAGNOSIS — Z299 Encounter for prophylactic measures, unspecified: Secondary | ICD-10-CM | POA: Diagnosis not present

## 2021-08-15 DIAGNOSIS — I10 Essential (primary) hypertension: Secondary | ICD-10-CM | POA: Diagnosis not present

## 2021-08-15 DIAGNOSIS — L0390 Cellulitis, unspecified: Secondary | ICD-10-CM | POA: Diagnosis not present

## 2021-08-25 DIAGNOSIS — Z0000 Encounter for general adult medical examination without abnormal findings: Secondary | ICD-10-CM | POA: Diagnosis not present

## 2021-08-25 DIAGNOSIS — I10 Essential (primary) hypertension: Secondary | ICD-10-CM | POA: Diagnosis not present

## 2021-08-25 DIAGNOSIS — Z789 Other specified health status: Secondary | ICD-10-CM | POA: Diagnosis not present

## 2021-08-25 DIAGNOSIS — Z6820 Body mass index (BMI) 20.0-20.9, adult: Secondary | ICD-10-CM | POA: Diagnosis not present

## 2021-08-25 DIAGNOSIS — M1990 Unspecified osteoarthritis, unspecified site: Secondary | ICD-10-CM | POA: Diagnosis not present

## 2021-08-25 DIAGNOSIS — Z299 Encounter for prophylactic measures, unspecified: Secondary | ICD-10-CM | POA: Diagnosis not present

## 2021-09-13 DIAGNOSIS — M79675 Pain in left toe(s): Secondary | ICD-10-CM | POA: Diagnosis not present

## 2021-09-13 DIAGNOSIS — M79674 Pain in right toe(s): Secondary | ICD-10-CM | POA: Diagnosis not present

## 2021-09-13 DIAGNOSIS — M79672 Pain in left foot: Secondary | ICD-10-CM | POA: Diagnosis not present

## 2021-09-13 DIAGNOSIS — L110 Acquired keratosis follicularis: Secondary | ICD-10-CM | POA: Diagnosis not present

## 2021-09-13 DIAGNOSIS — M79671 Pain in right foot: Secondary | ICD-10-CM | POA: Diagnosis not present

## 2021-09-13 DIAGNOSIS — I739 Peripheral vascular disease, unspecified: Secondary | ICD-10-CM | POA: Diagnosis not present

## 2021-10-13 DIAGNOSIS — R636 Underweight: Secondary | ICD-10-CM | POA: Diagnosis not present

## 2021-10-13 DIAGNOSIS — R54 Age-related physical debility: Secondary | ICD-10-CM | POA: Diagnosis not present

## 2021-10-13 DIAGNOSIS — R052 Subacute cough: Secondary | ICD-10-CM | POA: Diagnosis not present

## 2021-10-13 DIAGNOSIS — Z87440 Personal history of urinary (tract) infections: Secondary | ICD-10-CM | POA: Diagnosis not present

## 2021-10-13 DIAGNOSIS — R300 Dysuria: Secondary | ICD-10-CM | POA: Diagnosis not present

## 2021-10-13 DIAGNOSIS — Z515 Encounter for palliative care: Secondary | ICD-10-CM | POA: Diagnosis not present

## 2021-10-13 DIAGNOSIS — J069 Acute upper respiratory infection, unspecified: Secondary | ICD-10-CM | POA: Diagnosis not present

## 2021-10-17 DIAGNOSIS — Z6820 Body mass index (BMI) 20.0-20.9, adult: Secondary | ICD-10-CM | POA: Diagnosis not present

## 2021-10-17 DIAGNOSIS — R350 Frequency of micturition: Secondary | ICD-10-CM | POA: Diagnosis not present

## 2021-10-17 DIAGNOSIS — N390 Urinary tract infection, site not specified: Secondary | ICD-10-CM | POA: Diagnosis not present

## 2021-10-17 DIAGNOSIS — Z299 Encounter for prophylactic measures, unspecified: Secondary | ICD-10-CM | POA: Diagnosis not present

## 2021-10-17 DIAGNOSIS — I10 Essential (primary) hypertension: Secondary | ICD-10-CM | POA: Diagnosis not present

## 2021-10-18 ENCOUNTER — Other Ambulatory Visit: Payer: Self-pay

## 2021-10-18 ENCOUNTER — Encounter (HOSPITAL_COMMUNITY): Payer: Self-pay | Admitting: Emergency Medicine

## 2021-10-18 ENCOUNTER — Emergency Department (HOSPITAL_COMMUNITY)
Admission: EM | Admit: 2021-10-18 | Discharge: 2021-10-18 | Disposition: A | Discharge status: Home / Self Care | Payer: Medicare Other | Attending: Emergency Medicine | Admitting: Emergency Medicine

## 2021-10-18 ENCOUNTER — Emergency Department (HOSPITAL_COMMUNITY): Payer: Medicare Other

## 2021-10-18 DIAGNOSIS — S8255XA Nondisplaced fracture of medial malleolus of left tibia, initial encounter for closed fracture: Secondary | ICD-10-CM | POA: Insufficient documentation

## 2021-10-18 DIAGNOSIS — M7989 Other specified soft tissue disorders: Secondary | ICD-10-CM | POA: Diagnosis not present

## 2021-10-18 DIAGNOSIS — Z743 Need for continuous supervision: Secondary | ICD-10-CM | POA: Diagnosis not present

## 2021-10-18 DIAGNOSIS — Z79899 Other long term (current) drug therapy: Secondary | ICD-10-CM | POA: Diagnosis not present

## 2021-10-18 DIAGNOSIS — Z7982 Long term (current) use of aspirin: Secondary | ICD-10-CM | POA: Diagnosis not present

## 2021-10-18 DIAGNOSIS — S8265XA Nondisplaced fracture of lateral malleolus of left fibula, initial encounter for closed fracture: Secondary | ICD-10-CM | POA: Diagnosis not present

## 2021-10-18 DIAGNOSIS — I10 Essential (primary) hypertension: Secondary | ICD-10-CM | POA: Insufficient documentation

## 2021-10-18 DIAGNOSIS — W19XXXA Unspecified fall, initial encounter: Secondary | ICD-10-CM | POA: Diagnosis not present

## 2021-10-18 DIAGNOSIS — M19072 Primary osteoarthritis, left ankle and foot: Secondary | ICD-10-CM | POA: Diagnosis not present

## 2021-10-18 DIAGNOSIS — R52 Pain, unspecified: Secondary | ICD-10-CM | POA: Diagnosis not present

## 2021-10-18 DIAGNOSIS — S82892A Other fracture of left lower leg, initial encounter for closed fracture: Secondary | ICD-10-CM | POA: Diagnosis not present

## 2021-10-18 DIAGNOSIS — S99912A Unspecified injury of left ankle, initial encounter: Secondary | ICD-10-CM | POA: Diagnosis present

## 2021-10-18 DIAGNOSIS — R6889 Other general symptoms and signs: Secondary | ICD-10-CM | POA: Diagnosis not present

## 2021-10-18 NOTE — Discharge Instructions (Signed)
Keep leg elevated.  Take Tylenol for pain.  Follow-up with orthopedic doctor next week.  Use a walker to ambulate with

## 2021-10-18 NOTE — ED Triage Notes (Signed)
Pt tripped while walking with walker and c/o left ankle/foot pain.

## 2021-10-18 NOTE — ED Provider Notes (Signed)
[facility] EMERGENCY DEPARTMENT Provider Note   CSN: [id number] Arrival date & time: 10/18/21  1907     History {Add pertinent medical, surgical, social history, OB history to HPI:1} Chief Complaint  Patient presents with   Fall    Laura Cook is a 88 y.o. female.  Patient fell on her left ankle   Fall       Home Medications Prior to Admission medications   Medication Sig Start Date End Date Taking? Authorizing Provider  acetaminophen (TYLENOL) 325 MG tablet Take 2 tablets (650 mg total) by mouth every 6 (six) hours as needed for mild pain (or Fever >/= 101). 11/18/20  Yes Emokpae, Courage, MD  amLODipine (NORVASC) 5 MG tablet Take 5 mg by mouth daily. 11/11/20  Yes [provider]  aspirin EC 325 MG tablet Take 1 tablet (325 mg total) by mouth daily with breakfast. 11/18/20  Yes Emokpae, Courage, MD  Cholecalciferol (VITAMIN D3 PO) Take by mouth.   Yes [provider]  diclofenac Sodium (VOLTAREN) 1 % GEL Apply 2 g topically 2 (two) times daily.   Yes [provider]  hydrochlorothiazide (HYDRODIURIL) 50 MG tablet Take 50 mg by mouth every morning. 05/06/21  Yes [provider]  ketotifen (ZADITOR) 0.025 % ophthalmic solution Place 1 drop into both eyes 2 (two) times daily.   Yes [provider]  meclizine (ANTIVERT) 25 MG tablet Take 25 mg by mouth every 8 (eight) hours as needed. 07/01/20  Yes [provider]  meloxicam (MOBIC) 15 MG tablet Take 15 mg by mouth daily. 10/14/21  Yes [provider]  metoprolol succinate (TOPROL XL) 50 MG 24 hr tablet Take 1 tablet (50 mg total) by mouth daily. Take with or immediately following a meal. 11/18/20 11/18/21 Yes Emokpae, Courage, MD  Multiple Vitamins-Minerals (PRESERVISION AREDS 2 PO) Take by mouth.   Yes [provider]  omeprazole (PRILOSEC) 20 MG capsule Take 20 mg by mouth daily.   Yes [provider]  polyethylene glycol (MIRALAX) 17 g packet  Take 17 g by mouth daily. 11/18/20  Yes Roxan Hockey, MD  PREMARIN vaginal cream Place 1 g vaginally daily. 09/15/21  Yes [provider]  rosuvastatin (CRESTOR) 10 MG tablet Take 10 mg by mouth at bedtime. 05/14/21  Yes [provider]  sulfamethoxazole-trimethoprim (BACTRIM DS) 800-160 MG tablet Take 1 tablet by mouth 2 (two) times daily. 10/17/21  Yes [provider]  baclofen (LIORESAL) 10 MG tablet Take 10 mg by mouth 2 (two) times daily. Patient not taking: Reported on 10/18/2021 06/28/21   [provider]      Allergies    Patient has no known allergies.    Review of Systems   Review of Systems  Physical Exam Updated Vital Signs BP (!) 111/50   Pulse 68   Temp 98 F (36.7 C) (Oral)   Resp 18   Ht '5\' 1"'$  (1.549 m)   Wt 45 kg   SpO2 92%   BMI 18.74 kg/m  Physical Exam  ED Results / Procedures / Treatments   Labs (all labs ordered are listed, but only abnormal results are displayed) Labs Reviewed - No data to display  EKG None  Radiology DG Ankle Complete Left  Result Date: 10/18/2021 CLINICAL DATA:  Status post fall. EXAM: LEFT ANKLE COMPLETE - 3+ VIEW COMPARISON:  None Available. FINDINGS: An acute nondisplaced fracture deformity is seen involving the right lateral malleolus. An ill-defined curvilinear lucency is seen extending through the  left calcaneus. There is no evidence of dislocation. Mild to moderate severity degenerative changes seen along the dorsal aspect of the proximal to mid left foot. There is moderate severity vascular calcification. Mild to moderate severity diffuse soft tissue swelling is noted. IMPRESSION: 1. Acute nondisplaced fracture of the right lateral malleolus. 2. Linear lucency extending through the left calcaneus, which may represent a nondisplaced fracture. Correlation with physical examination is recommended to determine the presence of point tenderness. 3. Mild to moderate severity diffuse soft tissue  swelling. Electronically Signed   By: Virgina Norfolk M.D.   On: 10/18/2021 21:50   DG Foot Complete Left  Result Date: 10/18/2021 CLINICAL DATA:  Fall, left ankle and foot pain. EXAM: LEFT FOOT - COMPLETE 3+ VIEW COMPARISON:  None Available. FINDINGS: There is a nondisplaced fracture involving the medial malleolus. The remaining bony structures are intact. No dislocation. Degenerative changes are present in the midfoot. Soft tissue swelling is noted about the ankle and over the dorsum of the forefoot. Vascular calcifications are present in the soft tissues. IMPRESSION: 1. Nondisplaced fracture of the medial malleolus. 2. No acute fracture or dislocation in the foot. Electronically Signed   By: Brett Fairy M.D.   On: 10/18/2021 21:22    Procedures Procedures  {Document cardiac monitor, telemetry assessment procedure when appropriate:1}  Medications Ordered in ED Medications - No data to display  ED Course/ Medical Decision Making/ A&P                           Medical Decision Making Amount and/or Complexity of Data Reviewed Radiology: ordered.   Patient with nondisplaced lateral malleolus fracture on the left ankle.  She she is given a cam walker and will follow-up with Ortho  {Document critical care time when appropriate:1} {Document review of labs and clinical decision tools ie heart score, Chads2Vasc2 etc:1}  {Document your independent review of radiology images, and any outside records:1} {Document your discussion with family members, caretakers, and with consultants:1} {Document social determinants of health affecting pt's care:1} {Document your decision making why or why not admission, treatments were needed:1} Final Clinical Impression(s) / ED Diagnoses Final diagnoses:  Closed fracture of left ankle, initial encounter    Rx / DC Orders ED Discharge Orders     None

## 2021-10-19 ENCOUNTER — Telehealth: Payer: Self-pay | Admitting: Orthopedic Surgery

## 2021-10-19 NOTE — Telephone Encounter (Signed)
Scheduling for tomorrow, 10/20/21, with Dr Aline Brochure; patient aware.

## 2021-10-19 NOTE — Telephone Encounter (Signed)
I asked them to put somebody else in at that time so 950

## 2021-10-19 NOTE — Telephone Encounter (Signed)
Call from patient following visit at [facility] emergency room last night for injury from fall: per notes, Xray result indicates "Acute nondisplaced fracture of the right lateral malleolus" - based on template, please review and advise.

## 2021-10-20 ENCOUNTER — Telehealth: Payer: Self-pay | Admitting: Orthopedic Surgery

## 2021-10-20 ENCOUNTER — Ambulatory Visit (INDEPENDENT_AMBULATORY_CARE_PROVIDER_SITE_OTHER): Payer: Medicare Other | Admitting: Orthopedic Surgery

## 2021-10-20 ENCOUNTER — Encounter: Payer: Self-pay | Admitting: Orthopedic Surgery

## 2021-10-20 VITALS — Ht 61.0 in | Wt 99.0 lb

## 2021-10-20 DIAGNOSIS — S8255XA Nondisplaced fracture of medial malleolus of left tibia, initial encounter for closed fracture: Secondary | ICD-10-CM

## 2021-10-20 DIAGNOSIS — Z299 Encounter for prophylactic measures, unspecified: Secondary | ICD-10-CM | POA: Diagnosis not present

## 2021-10-20 DIAGNOSIS — S82892A Other fracture of left lower leg, initial encounter for closed fracture: Secondary | ICD-10-CM | POA: Diagnosis not present

## 2021-10-20 DIAGNOSIS — I10 Essential (primary) hypertension: Secondary | ICD-10-CM | POA: Diagnosis not present

## 2021-10-20 NOTE — Telephone Encounter (Signed)
Patient niece called and states her aunt needs a wheelchair to get around the house   Please call her back [phone]

## 2021-10-20 NOTE — Progress Notes (Addendum)
Chief Complaint  Patient presents with   Fracture    Lt ankle after fall DOI 10/18/21   New patient to me recently seen by Dr. Loletha Grayer with closed hip fracture  Patient had a fall  Patient went to ER  I reviewed the ER records  She has a nondisplaced medial malleolus fracture  The boot is not fitting her well  Review of systems was difficult based on the age and hearing barriers  Does not seem to have any other complaints  Exam of the left foot and ankle  Patient came in in wheelchair.  Boot did not fit well.  She has a moderate amount of swelling tenderness medially ankle motion is intact with pain neurovascular exam is normal.  Motor exam is normal.  First set of x-rays from the hospital I interpret these as nondisplaced medial malleolus fracture with intact ankle mortise  Second set of x-rays including left foot  No fracture a lot of osteoporosis  Impression nondisplaced medial malleolus fracture  If we can fit the cam walker with an extra small then I recommend an [employer]irc[employer] weight-bear as tolerated x-ray in 6 weeks  Hse needs a wheelchair to ambulate

## 2021-10-21 ENCOUNTER — Encounter: Payer: Self-pay | Admitting: Orthopedic Surgery

## 2021-10-21 ENCOUNTER — Telehealth: Payer: Self-pay | Admitting: Orthopedic Surgery

## 2021-10-21 DIAGNOSIS — Z299 Encounter for prophylactic measures, unspecified: Secondary | ICD-10-CM | POA: Diagnosis not present

## 2021-10-21 DIAGNOSIS — S82892A Other fracture of left lower leg, initial encounter for closed fracture: Secondary | ICD-10-CM | POA: Diagnosis not present

## 2021-10-21 DIAGNOSIS — Z713 Dietary counseling and surveillance: Secondary | ICD-10-CM | POA: Diagnosis not present

## 2021-10-21 DIAGNOSIS — R5383 Other fatigue: Secondary | ICD-10-CM | POA: Diagnosis not present

## 2021-10-21 DIAGNOSIS — Z6820 Body mass index (BMI) 20.0-20.9, adult: Secondary | ICD-10-CM | POA: Diagnosis not present

## 2021-10-21 NOTE — Telephone Encounter (Signed)
I called patient order for the w/c is at front desk and she was advised ADL care has to be taken care of private pay, she voiced understanding

## 2021-10-21 NOTE — Telephone Encounter (Signed)
Patient's niece April Stacey, ph [phone], reached, as Amy was unable to reach earlier about coming in early this afternoon for recheck of brace. States patient's son will bring, and will plan to come to office now. States will call right back if any change.

## 2021-10-21 NOTE — Telephone Encounter (Signed)
I have no advice

## 2021-10-21 NOTE — Telephone Encounter (Signed)
Yes ok 

## 2021-10-21 NOTE — Telephone Encounter (Signed)
Patient's niece, patient okayed adding as a contact - called back with message - states patient is relaying that her brace is too tight. Please advise if patient is to come in, or if other advice.

## 2021-10-21 NOTE — Telephone Encounter (Signed)
I have prepared order for wheelchair but your note from yesterday will need addendum to state : Patients diagnosis makes  moving around in home difficult, a walker or cane are not appropriate to use  It is difficult for patient to perform activities of daily living such as bathing or toileting  Patient is able to propel the manual wheelchair  The wheelchair will help with a specific medical condition or injury and be used in the home And,patient had a face-to-face meeting with the doctor  Thanks.

## 2021-10-21 NOTE — Telephone Encounter (Signed)
Amy aware patient confirmed coming in as discussed.

## 2021-10-21 NOTE — Telephone Encounter (Signed)
Patient's niece who was with patient at time of voice message yesterday, 10/20/21, 2:55pm, received 10/21/21 am - requesting assistance at home for a bath 2 days per week. States calling patient's insurance to see if home care for ADL's would be covered. Please advise.

## 2021-10-26 DIAGNOSIS — S82302A Unspecified fracture of lower end of left tibia, initial encounter for closed fracture: Secondary | ICD-10-CM | POA: Diagnosis not present

## 2021-10-26 DIAGNOSIS — Z299 Encounter for prophylactic measures, unspecified: Secondary | ICD-10-CM | POA: Diagnosis not present

## 2021-10-26 DIAGNOSIS — S8262XA Displaced fracture of lateral malleolus of left fibula, initial encounter for closed fracture: Secondary | ICD-10-CM | POA: Diagnosis not present

## 2021-10-26 DIAGNOSIS — E876 Hypokalemia: Secondary | ICD-10-CM | POA: Diagnosis not present

## 2021-10-26 DIAGNOSIS — R262 Difficulty in walking, not elsewhere classified: Secondary | ICD-10-CM | POA: Insufficient documentation

## 2021-10-26 DIAGNOSIS — I10 Essential (primary) hypertension: Secondary | ICD-10-CM | POA: Insufficient documentation

## 2021-10-26 DIAGNOSIS — R636 Underweight: Secondary | ICD-10-CM | POA: Diagnosis not present

## 2021-10-26 DIAGNOSIS — R2689 Other abnormalities of gait and mobility: Secondary | ICD-10-CM | POA: Diagnosis not present

## 2021-10-26 DIAGNOSIS — M1990 Unspecified osteoarthritis, unspecified site: Secondary | ICD-10-CM | POA: Diagnosis not present

## 2021-10-26 DIAGNOSIS — K219 Gastro-esophageal reflux disease without esophagitis: Secondary | ICD-10-CM | POA: Diagnosis not present

## 2021-10-26 DIAGNOSIS — Z960 Presence of urogenital implants: Secondary | ICD-10-CM | POA: Diagnosis not present

## 2021-10-26 DIAGNOSIS — W19XXXD Unspecified fall, subsequent encounter: Secondary | ICD-10-CM | POA: Diagnosis not present

## 2021-10-26 DIAGNOSIS — M6281 Muscle weakness (generalized): Secondary | ICD-10-CM | POA: Diagnosis not present

## 2021-10-26 DIAGNOSIS — R54 Age-related physical debility: Secondary | ICD-10-CM | POA: Diagnosis not present

## 2021-10-26 DIAGNOSIS — D649 Anemia, unspecified: Secondary | ICD-10-CM | POA: Diagnosis not present

## 2021-10-26 DIAGNOSIS — E222 Syndrome of inappropriate secretion of antidiuretic hormone: Secondary | ICD-10-CM | POA: Diagnosis not present

## 2021-10-26 DIAGNOSIS — E860 Dehydration: Secondary | ICD-10-CM | POA: Diagnosis not present

## 2021-10-26 DIAGNOSIS — Z79899 Other long term (current) drug therapy: Secondary | ICD-10-CM | POA: Diagnosis not present

## 2021-10-26 DIAGNOSIS — M7989 Other specified soft tissue disorders: Secondary | ICD-10-CM | POA: Diagnosis not present

## 2021-10-26 DIAGNOSIS — S8292XA Unspecified fracture of left lower leg, initial encounter for closed fracture: Secondary | ICD-10-CM | POA: Diagnosis not present

## 2021-10-26 DIAGNOSIS — E43 Unspecified severe protein-calorie malnutrition: Secondary | ICD-10-CM | POA: Diagnosis not present

## 2021-10-26 DIAGNOSIS — Z792 Long term (current) use of antibiotics: Secondary | ICD-10-CM | POA: Diagnosis not present

## 2021-10-26 DIAGNOSIS — Z87891 Personal history of nicotine dependence: Secondary | ICD-10-CM | POA: Diagnosis not present

## 2021-10-26 DIAGNOSIS — R1310 Dysphagia, unspecified: Secondary | ICD-10-CM | POA: Diagnosis not present

## 2021-10-26 DIAGNOSIS — Z9181 History of falling: Secondary | ICD-10-CM | POA: Diagnosis not present

## 2021-10-26 DIAGNOSIS — Z681 Body mass index (BMI) 19 or less, adult: Secondary | ICD-10-CM | POA: Diagnosis not present

## 2021-10-26 DIAGNOSIS — S82892A Other fracture of left lower leg, initial encounter for closed fracture: Secondary | ICD-10-CM | POA: Diagnosis not present

## 2021-10-26 DIAGNOSIS — S8252XD Displaced fracture of medial malleolus of left tibia, subsequent encounter for closed fracture with routine healing: Secondary | ICD-10-CM | POA: Diagnosis not present

## 2021-10-26 DIAGNOSIS — G894 Chronic pain syndrome: Secondary | ICD-10-CM | POA: Diagnosis not present

## 2021-10-26 DIAGNOSIS — S82892D Other fracture of left lower leg, subsequent encounter for closed fracture with routine healing: Secondary | ICD-10-CM | POA: Diagnosis not present

## 2021-10-26 DIAGNOSIS — E785 Hyperlipidemia, unspecified: Secondary | ICD-10-CM | POA: Diagnosis not present

## 2021-10-26 DIAGNOSIS — Z742 Need for assistance at home and no other household member able to render care: Secondary | ICD-10-CM | POA: Diagnosis not present

## 2021-10-26 DIAGNOSIS — W19XXXA Unspecified fall, initial encounter: Secondary | ICD-10-CM | POA: Diagnosis not present

## 2021-10-26 DIAGNOSIS — S8252XA Displaced fracture of medial malleolus of left tibia, initial encounter for closed fracture: Secondary | ICD-10-CM | POA: Diagnosis not present

## 2021-10-26 DIAGNOSIS — N8110 Cystocele, unspecified: Secondary | ICD-10-CM | POA: Diagnosis not present

## 2021-10-26 DIAGNOSIS — Z4789 Encounter for other orthopedic aftercare: Secondary | ICD-10-CM | POA: Diagnosis not present

## 2021-10-26 DIAGNOSIS — E871 Hypo-osmolality and hyponatremia: Secondary | ICD-10-CM | POA: Diagnosis not present

## 2021-10-26 DIAGNOSIS — N179 Acute kidney failure, unspecified: Secondary | ICD-10-CM | POA: Diagnosis not present

## 2021-10-26 DIAGNOSIS — R600 Localized edema: Secondary | ICD-10-CM | POA: Diagnosis not present

## 2021-10-26 DIAGNOSIS — E440 Moderate protein-calorie malnutrition: Secondary | ICD-10-CM | POA: Diagnosis not present

## 2021-10-26 DIAGNOSIS — S82842A Displaced bimalleolar fracture of left lower leg, initial encounter for closed fracture: Secondary | ICD-10-CM | POA: Diagnosis not present

## 2021-10-26 DIAGNOSIS — W1839XA Other fall on same level, initial encounter: Secondary | ICD-10-CM | POA: Diagnosis not present

## 2021-10-26 DIAGNOSIS — Z7982 Long term (current) use of aspirin: Secondary | ICD-10-CM | POA: Diagnosis not present

## 2021-10-29 DIAGNOSIS — N814 Uterovaginal prolapse, unspecified: Secondary | ICD-10-CM | POA: Insufficient documentation

## 2021-11-03 DIAGNOSIS — M1990 Unspecified osteoarthritis, unspecified site: Secondary | ICD-10-CM | POA: Insufficient documentation

## 2021-11-03 DIAGNOSIS — R54 Age-related physical debility: Secondary | ICD-10-CM | POA: Insufficient documentation

## 2021-11-03 DIAGNOSIS — R296 Repeated falls: Secondary | ICD-10-CM | POA: Insufficient documentation

## 2021-11-05 DIAGNOSIS — M1990 Unspecified osteoarthritis, unspecified site: Secondary | ICD-10-CM | POA: Diagnosis not present

## 2021-11-05 DIAGNOSIS — Z87828 Personal history of other (healed) physical injury and trauma: Secondary | ICD-10-CM | POA: Diagnosis not present

## 2021-11-05 DIAGNOSIS — Z79899 Other long term (current) drug therapy: Secondary | ICD-10-CM | POA: Diagnosis not present

## 2021-11-05 DIAGNOSIS — Z299 Encounter for prophylactic measures, unspecified: Secondary | ICD-10-CM | POA: Diagnosis not present

## 2021-11-05 DIAGNOSIS — M6281 Muscle weakness (generalized): Secondary | ICD-10-CM | POA: Diagnosis not present

## 2021-11-05 DIAGNOSIS — Z9181 History of falling: Secondary | ICD-10-CM | POA: Diagnosis not present

## 2021-11-05 DIAGNOSIS — Z4789 Encounter for other orthopedic aftercare: Secondary | ICD-10-CM | POA: Diagnosis not present

## 2021-11-05 DIAGNOSIS — E43 Unspecified severe protein-calorie malnutrition: Secondary | ICD-10-CM | POA: Diagnosis not present

## 2021-11-05 DIAGNOSIS — S82892A Other fracture of left lower leg, initial encounter for closed fracture: Secondary | ICD-10-CM | POA: Diagnosis not present

## 2021-11-05 DIAGNOSIS — Z87891 Personal history of nicotine dependence: Secondary | ICD-10-CM | POA: Diagnosis not present

## 2021-11-05 DIAGNOSIS — R1310 Dysphagia, unspecified: Secondary | ICD-10-CM | POA: Diagnosis not present

## 2021-11-05 DIAGNOSIS — K219 Gastro-esophageal reflux disease without esophagitis: Secondary | ICD-10-CM | POA: Diagnosis not present

## 2021-11-05 DIAGNOSIS — R296 Repeated falls: Secondary | ICD-10-CM | POA: Diagnosis not present

## 2021-11-05 DIAGNOSIS — Z792 Long term (current) use of antibiotics: Secondary | ICD-10-CM | POA: Diagnosis not present

## 2021-11-05 DIAGNOSIS — E876 Hypokalemia: Secondary | ICD-10-CM | POA: Diagnosis not present

## 2021-11-05 DIAGNOSIS — N390 Urinary tract infection, site not specified: Secondary | ICD-10-CM | POA: Diagnosis not present

## 2021-11-05 DIAGNOSIS — I10 Essential (primary) hypertension: Secondary | ICD-10-CM | POA: Diagnosis not present

## 2021-11-05 DIAGNOSIS — W19XXXD Unspecified fall, subsequent encounter: Secondary | ICD-10-CM | POA: Diagnosis not present

## 2021-11-05 DIAGNOSIS — R54 Age-related physical debility: Secondary | ICD-10-CM | POA: Diagnosis not present

## 2021-11-05 DIAGNOSIS — E871 Hypo-osmolality and hyponatremia: Secondary | ICD-10-CM | POA: Diagnosis not present

## 2021-11-05 DIAGNOSIS — S82892D Other fracture of left lower leg, subsequent encounter for closed fracture with routine healing: Secondary | ICD-10-CM | POA: Diagnosis not present

## 2021-11-05 DIAGNOSIS — B965 Pseudomonas (aeruginosa) (mallei) (pseudomallei) as the cause of diseases classified elsewhere: Secondary | ICD-10-CM | POA: Diagnosis not present

## 2021-11-05 DIAGNOSIS — K8689 Other specified diseases of pancreas: Secondary | ICD-10-CM | POA: Diagnosis not present

## 2021-11-05 DIAGNOSIS — Z7982 Long term (current) use of aspirin: Secondary | ICD-10-CM | POA: Diagnosis not present

## 2021-11-05 DIAGNOSIS — Z7722 Contact with and (suspected) exposure to environmental tobacco smoke (acute) (chronic): Secondary | ICD-10-CM | POA: Diagnosis not present

## 2021-11-05 DIAGNOSIS — G9341 Metabolic encephalopathy: Secondary | ICD-10-CM | POA: Diagnosis not present

## 2021-11-05 DIAGNOSIS — R2689 Other abnormalities of gait and mobility: Secondary | ICD-10-CM | POA: Diagnosis not present

## 2021-11-05 DIAGNOSIS — E785 Hyperlipidemia, unspecified: Secondary | ICD-10-CM | POA: Diagnosis not present

## 2021-11-05 DIAGNOSIS — R030 Elevated blood-pressure reading, without diagnosis of hypertension: Secondary | ICD-10-CM | POA: Diagnosis not present

## 2021-11-05 DIAGNOSIS — D649 Anemia, unspecified: Secondary | ICD-10-CM | POA: Diagnosis not present

## 2021-11-15 DIAGNOSIS — D649 Anemia, unspecified: Secondary | ICD-10-CM | POA: Diagnosis not present

## 2021-11-15 DIAGNOSIS — S82892A Other fracture of left lower leg, initial encounter for closed fracture: Secondary | ICD-10-CM | POA: Diagnosis not present

## 2021-11-15 DIAGNOSIS — I10 Essential (primary) hypertension: Secondary | ICD-10-CM | POA: Diagnosis not present

## 2021-11-15 DIAGNOSIS — Z299 Encounter for prophylactic measures, unspecified: Secondary | ICD-10-CM | POA: Diagnosis not present

## 2021-11-15 DIAGNOSIS — E871 Hypo-osmolality and hyponatremia: Secondary | ICD-10-CM | POA: Diagnosis not present

## 2021-11-18 DIAGNOSIS — Z7982 Long term (current) use of aspirin: Secondary | ICD-10-CM | POA: Diagnosis not present

## 2021-11-18 DIAGNOSIS — I10 Essential (primary) hypertension: Secondary | ICD-10-CM | POA: Diagnosis not present

## 2021-11-18 DIAGNOSIS — R030 Elevated blood-pressure reading, without diagnosis of hypertension: Secondary | ICD-10-CM | POA: Diagnosis not present

## 2021-11-18 DIAGNOSIS — K8689 Other specified diseases of pancreas: Secondary | ICD-10-CM | POA: Diagnosis not present

## 2021-11-18 DIAGNOSIS — R54 Age-related physical debility: Secondary | ICD-10-CM | POA: Diagnosis not present

## 2021-11-18 DIAGNOSIS — E871 Hypo-osmolality and hyponatremia: Secondary | ICD-10-CM | POA: Diagnosis not present

## 2021-11-18 DIAGNOSIS — K7689 Other specified diseases of liver: Secondary | ICD-10-CM | POA: Diagnosis not present

## 2021-11-18 DIAGNOSIS — M6281 Muscle weakness (generalized): Secondary | ICD-10-CM | POA: Diagnosis not present

## 2021-11-18 DIAGNOSIS — Z87891 Personal history of nicotine dependence: Secondary | ICD-10-CM | POA: Diagnosis not present

## 2021-11-18 DIAGNOSIS — B965 Pseudomonas (aeruginosa) (mallei) (pseudomallei) as the cause of diseases classified elsewhere: Secondary | ICD-10-CM | POA: Diagnosis not present

## 2021-11-18 DIAGNOSIS — R2689 Other abnormalities of gait and mobility: Secondary | ICD-10-CM | POA: Diagnosis not present

## 2021-11-18 DIAGNOSIS — Z8781 Personal history of (healed) traumatic fracture: Secondary | ICD-10-CM | POA: Diagnosis not present

## 2021-11-18 DIAGNOSIS — R1310 Dysphagia, unspecified: Secondary | ICD-10-CM | POA: Diagnosis not present

## 2021-11-18 DIAGNOSIS — E43 Unspecified severe protein-calorie malnutrition: Secondary | ICD-10-CM | POA: Diagnosis not present

## 2021-11-18 DIAGNOSIS — E46 Unspecified protein-calorie malnutrition: Secondary | ICD-10-CM | POA: Diagnosis not present

## 2021-11-18 DIAGNOSIS — E785 Hyperlipidemia, unspecified: Secondary | ICD-10-CM | POA: Diagnosis not present

## 2021-11-18 DIAGNOSIS — G9341 Metabolic encephalopathy: Secondary | ICD-10-CM | POA: Diagnosis not present

## 2021-11-18 DIAGNOSIS — K8020 Calculus of gallbladder without cholecystitis without obstruction: Secondary | ICD-10-CM | POA: Diagnosis not present

## 2021-11-18 DIAGNOSIS — N2889 Other specified disorders of kidney and ureter: Secondary | ICD-10-CM | POA: Diagnosis not present

## 2021-11-18 DIAGNOSIS — R296 Repeated falls: Secondary | ICD-10-CM | POA: Diagnosis not present

## 2021-11-18 DIAGNOSIS — Z792 Long term (current) use of antibiotics: Secondary | ICD-10-CM | POA: Diagnosis not present

## 2021-11-18 DIAGNOSIS — E876 Hypokalemia: Secondary | ICD-10-CM | POA: Diagnosis not present

## 2021-11-18 DIAGNOSIS — Z9181 History of falling: Secondary | ICD-10-CM | POA: Diagnosis not present

## 2021-11-18 DIAGNOSIS — N390 Urinary tract infection, site not specified: Secondary | ICD-10-CM | POA: Diagnosis not present

## 2021-11-18 DIAGNOSIS — Z7722 Contact with and (suspected) exposure to environmental tobacco smoke (acute) (chronic): Secondary | ICD-10-CM | POA: Diagnosis not present

## 2021-11-18 DIAGNOSIS — Z79899 Other long term (current) drug therapy: Secondary | ICD-10-CM | POA: Diagnosis not present

## 2021-11-18 DIAGNOSIS — Z87828 Personal history of other (healed) physical injury and trauma: Secondary | ICD-10-CM | POA: Diagnosis not present

## 2021-11-18 DIAGNOSIS — Z4789 Encounter for other orthopedic aftercare: Secondary | ICD-10-CM | POA: Diagnosis not present

## 2021-11-18 DIAGNOSIS — K869 Disease of pancreas, unspecified: Secondary | ICD-10-CM | POA: Diagnosis not present

## 2021-11-18 DIAGNOSIS — N289 Disorder of kidney and ureter, unspecified: Secondary | ICD-10-CM | POA: Diagnosis not present

## 2021-11-18 DIAGNOSIS — S82892D Other fracture of left lower leg, subsequent encounter for closed fracture with routine healing: Secondary | ICD-10-CM | POA: Diagnosis not present

## 2021-11-19 DIAGNOSIS — K8020 Calculus of gallbladder without cholecystitis without obstruction: Secondary | ICD-10-CM | POA: Diagnosis not present

## 2021-11-22 DIAGNOSIS — Z8781 Personal history of (healed) traumatic fracture: Secondary | ICD-10-CM | POA: Diagnosis not present

## 2021-11-22 DIAGNOSIS — E785 Hyperlipidemia, unspecified: Secondary | ICD-10-CM | POA: Diagnosis not present

## 2021-11-22 DIAGNOSIS — R1310 Dysphagia, unspecified: Secondary | ICD-10-CM | POA: Diagnosis not present

## 2021-11-22 DIAGNOSIS — I10 Essential (primary) hypertension: Secondary | ICD-10-CM | POA: Diagnosis not present

## 2021-11-22 DIAGNOSIS — R636 Underweight: Secondary | ICD-10-CM | POA: Diagnosis not present

## 2021-11-22 DIAGNOSIS — S82892D Other fracture of left lower leg, subsequent encounter for closed fracture with routine healing: Secondary | ICD-10-CM | POA: Diagnosis not present

## 2021-11-22 DIAGNOSIS — Z9181 History of falling: Secondary | ICD-10-CM | POA: Diagnosis not present

## 2021-11-22 DIAGNOSIS — G9341 Metabolic encephalopathy: Secondary | ICD-10-CM | POA: Diagnosis not present

## 2021-11-22 DIAGNOSIS — S82302D Unspecified fracture of lower end of left tibia, subsequent encounter for closed fracture with routine healing: Secondary | ICD-10-CM | POA: Diagnosis not present

## 2021-11-22 DIAGNOSIS — M6281 Muscle weakness (generalized): Secondary | ICD-10-CM | POA: Diagnosis not present

## 2021-11-22 DIAGNOSIS — R310 Gross hematuria: Secondary | ICD-10-CM | POA: Diagnosis not present

## 2021-11-22 DIAGNOSIS — Z515 Encounter for palliative care: Secondary | ICD-10-CM | POA: Diagnosis not present

## 2021-11-22 DIAGNOSIS — Z9889 Other specified postprocedural states: Secondary | ICD-10-CM | POA: Diagnosis not present

## 2021-11-22 DIAGNOSIS — H353133 Nonexudative age-related macular degeneration, bilateral, advanced atrophic without subfoveal involvement: Secondary | ICD-10-CM | POA: Diagnosis not present

## 2021-11-22 DIAGNOSIS — R5383 Other fatigue: Secondary | ICD-10-CM | POA: Diagnosis not present

## 2021-11-22 DIAGNOSIS — S8292XA Unspecified fracture of left lower leg, initial encounter for closed fracture: Secondary | ICD-10-CM | POA: Diagnosis not present

## 2021-11-22 DIAGNOSIS — Z6821 Body mass index (BMI) 21.0-21.9, adult: Secondary | ICD-10-CM | POA: Diagnosis not present

## 2021-11-22 DIAGNOSIS — E871 Hypo-osmolality and hyponatremia: Secondary | ICD-10-CM | POA: Diagnosis not present

## 2021-11-22 DIAGNOSIS — R54 Age-related physical debility: Secondary | ICD-10-CM | POA: Diagnosis not present

## 2021-11-22 DIAGNOSIS — C787 Secondary malignant neoplasm of liver and intrahepatic bile duct: Secondary | ICD-10-CM | POA: Diagnosis not present

## 2021-11-22 DIAGNOSIS — N390 Urinary tract infection, site not specified: Secondary | ICD-10-CM | POA: Diagnosis not present

## 2021-11-22 DIAGNOSIS — Z792 Long term (current) use of antibiotics: Secondary | ICD-10-CM | POA: Diagnosis not present

## 2021-11-22 DIAGNOSIS — R2689 Other abnormalities of gait and mobility: Secondary | ICD-10-CM | POA: Diagnosis not present

## 2021-11-22 DIAGNOSIS — K7689 Other specified diseases of liver: Secondary | ICD-10-CM | POA: Diagnosis not present

## 2021-11-22 DIAGNOSIS — Z66 Do not resuscitate: Secondary | ICD-10-CM | POA: Diagnosis not present

## 2021-11-22 DIAGNOSIS — C641 Malignant neoplasm of right kidney, except renal pelvis: Secondary | ICD-10-CM | POA: Diagnosis not present

## 2021-11-22 DIAGNOSIS — E43 Unspecified severe protein-calorie malnutrition: Secondary | ICD-10-CM | POA: Diagnosis not present

## 2021-11-22 DIAGNOSIS — K8689 Other specified diseases of pancreas: Secondary | ICD-10-CM | POA: Diagnosis not present

## 2021-11-22 DIAGNOSIS — N2889 Other specified disorders of kidney and ureter: Secondary | ICD-10-CM | POA: Diagnosis not present

## 2021-11-22 DIAGNOSIS — B965 Pseudomonas (aeruginosa) (mallei) (pseudomallei) as the cause of diseases classified elsewhere: Secondary | ICD-10-CM | POA: Diagnosis not present

## 2021-11-22 DIAGNOSIS — S82392A Other fracture of lower end of left tibia, initial encounter for closed fracture: Secondary | ICD-10-CM | POA: Diagnosis not present

## 2021-11-22 DIAGNOSIS — Z4789 Encounter for other orthopedic aftercare: Secondary | ICD-10-CM | POA: Diagnosis not present

## 2021-11-22 DIAGNOSIS — R296 Repeated falls: Secondary | ICD-10-CM | POA: Diagnosis not present

## 2021-11-22 DIAGNOSIS — Z299 Encounter for prophylactic measures, unspecified: Secondary | ICD-10-CM | POA: Diagnosis not present

## 2021-11-24 DIAGNOSIS — Z299 Encounter for prophylactic measures, unspecified: Secondary | ICD-10-CM | POA: Diagnosis not present

## 2021-11-24 DIAGNOSIS — Z515 Encounter for palliative care: Secondary | ICD-10-CM | POA: Diagnosis not present

## 2021-11-24 DIAGNOSIS — I10 Essential (primary) hypertension: Secondary | ICD-10-CM | POA: Diagnosis not present

## 2021-11-24 DIAGNOSIS — C641 Malignant neoplasm of right kidney, except renal pelvis: Secondary | ICD-10-CM | POA: Diagnosis not present

## 2021-11-24 DIAGNOSIS — C787 Secondary malignant neoplasm of liver and intrahepatic bile duct: Secondary | ICD-10-CM | POA: Diagnosis not present

## 2021-11-24 DIAGNOSIS — Z66 Do not resuscitate: Secondary | ICD-10-CM | POA: Diagnosis not present

## 2021-11-28 DIAGNOSIS — S82892D Other fracture of left lower leg, subsequent encounter for closed fracture with routine healing: Secondary | ICD-10-CM | POA: Diagnosis not present

## 2021-11-28 DIAGNOSIS — Z9889 Other specified postprocedural states: Secondary | ICD-10-CM | POA: Diagnosis not present

## 2021-11-28 DIAGNOSIS — S82302D Unspecified fracture of lower end of left tibia, subsequent encounter for closed fracture with routine healing: Secondary | ICD-10-CM | POA: Diagnosis not present

## 2021-11-28 DIAGNOSIS — S82392A Other fracture of lower end of left tibia, initial encounter for closed fracture: Secondary | ICD-10-CM | POA: Diagnosis not present

## 2021-12-01 ENCOUNTER — Encounter: Payer: Self-pay | Admitting: Orthopedic Surgery

## 2021-12-01 ENCOUNTER — Encounter: Payer: Medicare Other | Admitting: Orthopedic Surgery

## 2021-12-01 ENCOUNTER — Telehealth: Payer: Self-pay | Admitting: Orthopedic Surgery

## 2021-12-01 NOTE — Telephone Encounter (Signed)
Patient's son, Alayiah Fontes, [phone], designated contact on file, called in response to message left regarding today's missed appointment, 12/01/21, to relay that patient is at [facility], due to having another injury to ankle "a couple days after she was last seen here". States had surgery by orthopaedist in [place]. Since patient is under current care of [facility], son is holding on rescheduling appointment that had been made with Dr Aline Brochure as a follow up to initial injury.

## 2021-12-07 DIAGNOSIS — Z6821 Body mass index (BMI) 21.0-21.9, adult: Secondary | ICD-10-CM | POA: Diagnosis not present

## 2021-12-07 DIAGNOSIS — R636 Underweight: Secondary | ICD-10-CM | POA: Diagnosis not present

## 2021-12-07 DIAGNOSIS — R54 Age-related physical debility: Secondary | ICD-10-CM | POA: Diagnosis not present

## 2021-12-07 DIAGNOSIS — S8292XA Unspecified fracture of left lower leg, initial encounter for closed fracture: Secondary | ICD-10-CM | POA: Diagnosis not present

## 2021-12-07 DIAGNOSIS — R296 Repeated falls: Secondary | ICD-10-CM | POA: Diagnosis not present

## 2021-12-07 DIAGNOSIS — Z515 Encounter for palliative care: Secondary | ICD-10-CM | POA: Diagnosis not present

## 2021-12-14 ENCOUNTER — Ambulatory Visit: Payer: Medicare Other | Admitting: Urology

## 2021-12-16 DIAGNOSIS — H353133 Nonexudative age-related macular degeneration, bilateral, advanced atrophic without subfoveal involvement: Secondary | ICD-10-CM | POA: Diagnosis not present

## 2021-12-26 DIAGNOSIS — S82892D Other fracture of left lower leg, subsequent encounter for closed fracture with routine healing: Secondary | ICD-10-CM | POA: Diagnosis not present

## 2022-01-03 DIAGNOSIS — Z299 Encounter for prophylactic measures, unspecified: Secondary | ICD-10-CM | POA: Diagnosis not present

## 2022-01-03 DIAGNOSIS — R5383 Other fatigue: Secondary | ICD-10-CM | POA: Diagnosis not present

## 2022-01-03 DIAGNOSIS — E871 Hypo-osmolality and hyponatremia: Secondary | ICD-10-CM | POA: Diagnosis not present

## 2022-01-04 ENCOUNTER — Encounter: Payer: Self-pay | Admitting: Urology

## 2022-01-04 ENCOUNTER — Ambulatory Visit: Admitting: Urology

## 2022-01-04 VITALS — BP 157/79 | HR 72

## 2022-01-04 DIAGNOSIS — R310 Gross hematuria: Secondary | ICD-10-CM | POA: Diagnosis not present

## 2022-01-04 NOTE — Patient Instructions (Signed)

## 2022-01-04 NOTE — Progress Notes (Signed)
01/04/2022 9:35 AM   Laura Cook 11/10/1922 [id number]  Referring provider: Monico Blitz, MD [address] [place],  [place] [address]  Followup Gross hematuria   HPI: Laura Cook is a 88yo here for followup for gross hematuria. No hematuria since last visit. She did have a UTI in 10/2021 during hospitalization for a broken ankle. No dysuria. No significant LUTS.    PMH: Past Medical History:  Diagnosis Date   Hypertension     Surgical History: Past Surgical History:  Procedure Laterality Date   ABDOMINAL HYSTERECTOMY     DILATION AND CURETTAGE OF UTERUS     HIP FRACTURE SURGERY Left    INTRAMEDULLARY (IM) NAIL INTERTROCHANTERIC Right 11/15/2020   Procedure: INTRAMEDULLARY (IM) NAIL INTERTROCHANTRIC;  Surgeon: Mordecai Rasmussen, MD;  Location: [facility];  Service: Orthopedics;  Laterality: Right;   YAG LASER APPLICATION Left 84/16/6063   Procedure: YAG LASER APPLICATION;  Surgeon: Williams Che, MD;  Location: [facility];  Service: Ophthalmology;  Laterality: Left;    Home Medications:  Allergies as of 01/04/2022   No Known Allergies      Medication List        Accurate as of January 04, 2022  9:35 AM. If you have any questions, ask your nurse or doctor.          acetaminophen 325 MG tablet Commonly known as: TYLENOL Take 2 tablets (650 mg total) by mouth every 6 (six) hours as needed for mild pain (or Fever >/= 101).   amLODipine 5 MG tablet Commonly known as: NORVASC Take 5 mg by mouth daily.   aspirin EC 325 MG tablet Take 1 tablet (325 mg total) by mouth daily with breakfast.   baclofen 10 MG tablet Commonly known as: LIORESAL Take 10 mg by mouth 2 (two) times daily.   diclofenac Sodium 1 % Gel Commonly known as: VOLTAREN Apply 2 g topically 2 (two) times daily.   hydrochlorothiazide 50 MG tablet Commonly known as: HYDRODIURIL Take 50 mg by mouth every morning.   ketotifen 0.025 % ophthalmic solution Commonly known as: ZADITOR Place 1 drop into both  eyes 2 (two) times daily.   meclizine 25 MG tablet Commonly known as: ANTIVERT Take 25 mg by mouth every 8 (eight) hours as needed.   meloxicam 15 MG tablet Commonly known as: MOBIC Take 15 mg by mouth daily.   metoprolol succinate 50 MG 24 hr tablet Commonly known as: Toprol XL Take 1 tablet (50 mg total) by mouth daily. Take with or immediately following a meal.   omeprazole 20 MG capsule Commonly known as: PRILOSEC Take 20 mg by mouth daily.   polyethylene glycol 17 g packet Commonly known as: MiraLax Take 17 g by mouth daily.   Premarin vaginal cream Generic drug: conjugated estrogens Place 1 g vaginally daily.   PRESERVISION AREDS 2 PO Take by mouth.   rosuvastatin 10 MG tablet Commonly known as: CRESTOR Take 10 mg by mouth at bedtime.   sulfamethoxazole-trimethoprim 800-160 MG tablet Commonly known as: BACTRIM DS Take 1 tablet by mouth 2 (two) times daily.   VITAMIN D3 PO Take by mouth.        Allergies: No Known Allergies  Family History: No family history on file.  Social History:  reports that she has never smoked. She has never been exposed to tobacco smoke. She has never used smokeless tobacco. She reports that she does not drink alcohol and does not use drugs.  ROS: All other review of  systems were reviewed and are negative except what is noted above in HPI  Physical Exam: BP (!) 157/79   Pulse 72   Constitutional:  Alert and oriented, No acute distress. HEENT: [place] AT, moist mucus membranes.  Trachea midline, no masses. Cardiovascular: No clubbing, cyanosis, or edema. Respiratory: Normal respiratory effort, no increased work of breathing. GI: Abdomen is soft, nontender, nondistended, no abdominal masses GU: No CVA tenderness.  Lymph: No cervical or inguinal lymphadenopathy. Skin: No rashes, bruises or suspicious lesions. Neurologic: Grossly intact, no focal deficits, moving all 4 extremities. Psychiatric: Normal mood and  affect.  Laboratory Data: Lab Results  Component Value Date   WBC 6.5 06/30/2021   HGB 12.1 06/30/2021   HCT 36.0 06/30/2021   MCV 88.5 06/30/2021   PLT 272 06/30/2021    Lab Results  Component Value Date   CREATININE 0.48 06/30/2021    No results found for: "PSA"  No results found for: "TESTOSTERONE"  No results found for: "HGBA1C"  Urinalysis    Component Value Date/Time   COLORURINE RED (A) 06/30/2021 1201   APPEARANCEUR Hazy (A) 07/06/2021 1039   LABSPEC  06/30/2021 1201    TEST NOT REPORTED DUE TO COLOR INTERFERENCE OF URINE PIGMENT   PHURINE  06/30/2021 1201    TEST NOT REPORTED DUE TO COLOR INTERFERENCE OF URINE PIGMENT   GLUCOSEU Negative 07/06/2021 1039   HGBUR (A) 06/30/2021 1201    TEST NOT REPORTED DUE TO COLOR INTERFERENCE OF URINE PIGMENT   BILIRUBINUR Negative 07/06/2021 1039   KETONESUR (A) 06/30/2021 1201    TEST NOT REPORTED DUE TO COLOR INTERFERENCE OF URINE PIGMENT   PROTEINUR Trace (A) 07/06/2021 1039   PROTEINUR (A) 06/30/2021 1201    TEST NOT REPORTED DUE TO COLOR INTERFERENCE OF URINE PIGMENT   NITRITE Negative 07/06/2021 1039   NITRITE NEGATIVE 06/30/2021 1201   LEUKOCYTESUR Negative 07/06/2021 1039   LEUKOCYTESUR (A) 06/30/2021 1201    TEST NOT REPORTED DUE TO COLOR INTERFERENCE OF URINE PIGMENT    Lab Results  Component Value Date   LABMICR Comment 07/06/2021   BACTERIA NONE SEEN 06/30/2021    Pertinent Imaging:  No results found for this or any previous visit.  No results found for this or any previous visit.  No results found for this or any previous visit.  No results found for this or any previous visit.  No results found for this or any previous visit.  No valid procedures specified. No results found for this or any previous visit.  Results for orders placed during the hospital encounter of 06/30/21  CT Renal Stone Study  Narrative CLINICAL DATA:  Hematuria. History of nephrolithiasis. Hypertension.  EXAM: CT  ABDOMEN AND PELVIS WITHOUT CONTRAST  TECHNIQUE: Multidetector CT imaging of the abdomen and pelvis was performed following the standard protocol without IV contrast.  RADIATION DOSE REDUCTION: This exam was performed according to the departmental dose-optimization program which includes automated exposure control, adjustment of the mA and/or kV according to patient size and/or use of iterative reconstruction technique.  COMPARISON:  None Available.  FINDINGS: Lower chest: Mild motion degradation. Grossly clear lung bases. Cardiomegaly, accentuated by a pectus excavatum deformity. Moderate to large hiatal hernia, with greater than 1/2 of the stomach positioned in the lower chest. Multivessel coronary artery atherosclerosis.  Hepatobiliary: Normal noncontrast appearance of the liver. A 2.2 cm gallstone without acute cholecystitis or biliary duct dilatation.  Pancreas: Pancreatic atrophy is within normal variation for age. There may be a 9 mm  pancreatic cystic lesion within the body on 24/2, but this is of doubtful clinical significance given patient age. No peripancreatic inflammation.  Spleen: Normal in size, without focal abnormality.  Adrenals/Urinary Tract: Mild bilateral adrenal thickening.  Upper pole right renal 3.2 cm lesion measures greater than fluid density on 18/2.  Low-density renal lesions are likely cysts including at up to 4.0 cm exophytic off the lower pole left kidney.  Right renal vascular calcifications. Left renal collecting system calculi versus renal vascular calcifications. Example interpolar left kidney at 2 mm.  No hydronephrosis. The ureters are difficult to follow, but there is no gross hydroureter.  Dependent hyperdense material within the left side of the bladder on 60/2 is presumably hemorrhage. Anterior bladder wall irregularity is secondary to small saccules.  Stomach/Bowel: Normal remainder of the stomach. Extensive  colonic diverticulosis. Colonic stool burden suggests constipation. Normal small bowel.  Vascular/Lymphatic: Advanced aortic and branch vessel atherosclerosis. No abdominopelvic adenopathy.  Reproductive: Artifact degraded evaluation of the pelvis from proximal femur fixation bilaterally. No adnexal mass. Possible soft tissue fullness within the uterine body and fundus with central hypoattenuation including on sagittal image 58.  Other: Pelvic floor laxity. No significant free fluid. No free intraperitoneal air.  Musculoskeletal: Advanced osteopenia. Lumbosacral spondylosis. Moderate convex right lumbar spine curvature  IMPRESSION: 1. Left nephrolithiasis versus renal vascular calcifications. No obstructive uropathy. 2. Multifactorial degradation, primarily involving the pelvis, as detailed above. 3. Hyperattenuating material in the dependent bladder is likely hemorrhage. Bladder saccules suggesting a component of outlet obstruction. Limited evaluation for underlying bladder lesion. 4. Cholelithiasis 5. Possible soft tissue fullness and heterogeneity within the uterine body and fundus. Suboptimally evaluated secondary to artifact within the pelvis. Correlate with postmenopausal bleeding and possibly pelvic ultrasound. Of note, the clinical history describes prior hysterectomy. If the patient is truly status post hysterectomy, this soft tissue fullness could alternatively represent neoplasm either exophytic off or adjacent to the urinary bladder. Consider contrast enhanced pelvic CT. 6.  Possible constipation. 7. Hiatal hernia 8. Upper pole right renal lesion is technically indeterminate for hemorrhagic cyst versus solid neoplasm. Given size and patient age, of doubtful clinical significance. If further evaluation is desired, renal ultrasound could be performed.   Electronically Signed By: Abigail Miyamoto M.D. On: 06/30/2021 14:46   Assessment & Plan:    1. Gross  hematuria -UA fro facility to be obtained. If it shows no blood she can followup in 1 year with a UA - BLADDER SCAN AMB NON-IMAGING   No follow-ups on file.  Nicolette Bang, MD  [facility] [place]

## 2022-01-04 NOTE — Progress Notes (Signed)
post void residual=75

## 2022-01-19 DIAGNOSIS — R296 Repeated falls: Secondary | ICD-10-CM | POA: Diagnosis not present

## 2022-01-19 DIAGNOSIS — R54 Age-related physical debility: Secondary | ICD-10-CM | POA: Diagnosis not present

## 2022-01-19 DIAGNOSIS — Z515 Encounter for palliative care: Secondary | ICD-10-CM | POA: Diagnosis not present

## 2022-01-20 ENCOUNTER — Other Ambulatory Visit: Payer: Self-pay | Admitting: *Deleted

## 2022-01-20 NOTE — Patient Outreach (Signed)
Mrs. Winward resides in [facility]. Screening for potential [facility] care coordination services as benefit of insurance plan and PCP.   Update received from SNF social worker. Transition plan is to return home with son.   Will continue to follow for potential [facility] needs.    Marthenia Rolling, MSN, RN,BSN [place] Acute Care Coordinator [phone] (Direct dial)

## 2022-01-23 DIAGNOSIS — S82892D Other fracture of left lower leg, subsequent encounter for closed fracture with routine healing: Secondary | ICD-10-CM | POA: Diagnosis not present

## 2022-01-28 DIAGNOSIS — R1310 Dysphagia, unspecified: Secondary | ICD-10-CM | POA: Diagnosis not present

## 2022-01-28 DIAGNOSIS — M6281 Muscle weakness (generalized): Secondary | ICD-10-CM | POA: Diagnosis not present

## 2022-01-28 DIAGNOSIS — Z4789 Encounter for other orthopedic aftercare: Secondary | ICD-10-CM | POA: Diagnosis not present

## 2022-01-28 DIAGNOSIS — R2689 Other abnormalities of gait and mobility: Secondary | ICD-10-CM | POA: Diagnosis not present

## 2022-01-30 DIAGNOSIS — R1310 Dysphagia, unspecified: Secondary | ICD-10-CM | POA: Diagnosis not present

## 2022-01-30 DIAGNOSIS — Z4789 Encounter for other orthopedic aftercare: Secondary | ICD-10-CM | POA: Diagnosis not present

## 2022-01-30 DIAGNOSIS — M6281 Muscle weakness (generalized): Secondary | ICD-10-CM | POA: Diagnosis not present

## 2022-01-30 DIAGNOSIS — R2689 Other abnormalities of gait and mobility: Secondary | ICD-10-CM | POA: Diagnosis not present

## 2022-01-31 DIAGNOSIS — Z4789 Encounter for other orthopedic aftercare: Secondary | ICD-10-CM | POA: Diagnosis not present

## 2022-01-31 DIAGNOSIS — R1310 Dysphagia, unspecified: Secondary | ICD-10-CM | POA: Diagnosis not present

## 2022-01-31 DIAGNOSIS — R2689 Other abnormalities of gait and mobility: Secondary | ICD-10-CM | POA: Diagnosis not present

## 2022-01-31 DIAGNOSIS — M6281 Muscle weakness (generalized): Secondary | ICD-10-CM | POA: Diagnosis not present

## 2022-02-01 DIAGNOSIS — R1310 Dysphagia, unspecified: Secondary | ICD-10-CM | POA: Diagnosis not present

## 2022-02-01 DIAGNOSIS — R2689 Other abnormalities of gait and mobility: Secondary | ICD-10-CM | POA: Diagnosis not present

## 2022-02-01 DIAGNOSIS — Z4789 Encounter for other orthopedic aftercare: Secondary | ICD-10-CM | POA: Diagnosis not present

## 2022-02-01 DIAGNOSIS — M6281 Muscle weakness (generalized): Secondary | ICD-10-CM | POA: Diagnosis not present

## 2022-02-02 DIAGNOSIS — Z4789 Encounter for other orthopedic aftercare: Secondary | ICD-10-CM | POA: Diagnosis not present

## 2022-02-02 DIAGNOSIS — R2689 Other abnormalities of gait and mobility: Secondary | ICD-10-CM | POA: Diagnosis not present

## 2022-02-02 DIAGNOSIS — M6281 Muscle weakness (generalized): Secondary | ICD-10-CM | POA: Diagnosis not present

## 2022-02-02 DIAGNOSIS — R1310 Dysphagia, unspecified: Secondary | ICD-10-CM | POA: Diagnosis not present

## 2022-02-03 DIAGNOSIS — R2689 Other abnormalities of gait and mobility: Secondary | ICD-10-CM | POA: Diagnosis not present

## 2022-02-03 DIAGNOSIS — R1310 Dysphagia, unspecified: Secondary | ICD-10-CM | POA: Diagnosis not present

## 2022-02-03 DIAGNOSIS — M6281 Muscle weakness (generalized): Secondary | ICD-10-CM | POA: Diagnosis not present

## 2022-02-03 DIAGNOSIS — Z4789 Encounter for other orthopedic aftercare: Secondary | ICD-10-CM | POA: Diagnosis not present

## 2022-02-05 DIAGNOSIS — M6281 Muscle weakness (generalized): Secondary | ICD-10-CM | POA: Diagnosis not present

## 2022-02-05 DIAGNOSIS — R1310 Dysphagia, unspecified: Secondary | ICD-10-CM | POA: Diagnosis not present

## 2022-02-05 DIAGNOSIS — R2689 Other abnormalities of gait and mobility: Secondary | ICD-10-CM | POA: Diagnosis not present

## 2022-02-05 DIAGNOSIS — Z4789 Encounter for other orthopedic aftercare: Secondary | ICD-10-CM | POA: Diagnosis not present

## 2022-02-06 DIAGNOSIS — R2689 Other abnormalities of gait and mobility: Secondary | ICD-10-CM | POA: Diagnosis not present

## 2022-02-06 DIAGNOSIS — R1310 Dysphagia, unspecified: Secondary | ICD-10-CM | POA: Diagnosis not present

## 2022-02-06 DIAGNOSIS — M6281 Muscle weakness (generalized): Secondary | ICD-10-CM | POA: Diagnosis not present

## 2022-02-06 DIAGNOSIS — Z4789 Encounter for other orthopedic aftercare: Secondary | ICD-10-CM | POA: Diagnosis not present

## 2022-02-07 DIAGNOSIS — R2689 Other abnormalities of gait and mobility: Secondary | ICD-10-CM | POA: Diagnosis not present

## 2022-02-07 DIAGNOSIS — Z4789 Encounter for other orthopedic aftercare: Secondary | ICD-10-CM | POA: Diagnosis not present

## 2022-02-07 DIAGNOSIS — M6281 Muscle weakness (generalized): Secondary | ICD-10-CM | POA: Diagnosis not present

## 2022-02-07 DIAGNOSIS — R1310 Dysphagia, unspecified: Secondary | ICD-10-CM | POA: Diagnosis not present

## 2022-02-08 ENCOUNTER — Other Ambulatory Visit: Payer: Self-pay | Admitting: *Deleted

## 2022-02-08 DIAGNOSIS — M6281 Muscle weakness (generalized): Secondary | ICD-10-CM | POA: Diagnosis not present

## 2022-02-08 DIAGNOSIS — Z4789 Encounter for other orthopedic aftercare: Secondary | ICD-10-CM | POA: Diagnosis not present

## 2022-02-08 DIAGNOSIS — R1310 Dysphagia, unspecified: Secondary | ICD-10-CM | POA: Diagnosis not present

## 2022-02-08 DIAGNOSIS — R2689 Other abnormalities of gait and mobility: Secondary | ICD-10-CM | POA: Diagnosis not present

## 2022-02-08 NOTE — Patient Outreach (Signed)
[place] Coordinator follow up. Mrs. Audia resides in [facility]. Screening for potential [facility] care coordination services as benefit of insurance plan and PCP.   Update received from SNF social worker indicating SNF stay is under reconsideration appeal with [employer]. Anticipated transition plan is to return home with son.   Will continue to follow.    Marthenia Rolling, MSN, RN,BSN [place] Acute Care Coordinator [phone] (Direct dial)

## 2022-02-09 DIAGNOSIS — R2689 Other abnormalities of gait and mobility: Secondary | ICD-10-CM | POA: Diagnosis not present

## 2022-02-09 DIAGNOSIS — Z4789 Encounter for other orthopedic aftercare: Secondary | ICD-10-CM | POA: Diagnosis not present

## 2022-02-09 DIAGNOSIS — R1310 Dysphagia, unspecified: Secondary | ICD-10-CM | POA: Diagnosis not present

## 2022-02-09 DIAGNOSIS — M6281 Muscle weakness (generalized): Secondary | ICD-10-CM | POA: Diagnosis not present

## 2022-02-10 DIAGNOSIS — R2689 Other abnormalities of gait and mobility: Secondary | ICD-10-CM | POA: Diagnosis not present

## 2022-02-10 DIAGNOSIS — M6281 Muscle weakness (generalized): Secondary | ICD-10-CM | POA: Diagnosis not present

## 2022-02-10 DIAGNOSIS — R1310 Dysphagia, unspecified: Secondary | ICD-10-CM | POA: Diagnosis not present

## 2022-02-10 DIAGNOSIS — Z4789 Encounter for other orthopedic aftercare: Secondary | ICD-10-CM | POA: Diagnosis not present

## 2022-02-11 DIAGNOSIS — Z4789 Encounter for other orthopedic aftercare: Secondary | ICD-10-CM | POA: Diagnosis not present

## 2022-02-11 DIAGNOSIS — R2689 Other abnormalities of gait and mobility: Secondary | ICD-10-CM | POA: Diagnosis not present

## 2022-02-11 DIAGNOSIS — M6281 Muscle weakness (generalized): Secondary | ICD-10-CM | POA: Diagnosis not present

## 2022-02-11 DIAGNOSIS — R1310 Dysphagia, unspecified: Secondary | ICD-10-CM | POA: Diagnosis not present

## 2022-02-14 DIAGNOSIS — R1310 Dysphagia, unspecified: Secondary | ICD-10-CM | POA: Diagnosis not present

## 2022-02-14 DIAGNOSIS — M6281 Muscle weakness (generalized): Secondary | ICD-10-CM | POA: Diagnosis not present

## 2022-02-14 DIAGNOSIS — Z4789 Encounter for other orthopedic aftercare: Secondary | ICD-10-CM | POA: Diagnosis not present

## 2022-02-14 DIAGNOSIS — R2689 Other abnormalities of gait and mobility: Secondary | ICD-10-CM | POA: Diagnosis not present

## 2022-02-15 DIAGNOSIS — R1310 Dysphagia, unspecified: Secondary | ICD-10-CM | POA: Diagnosis not present

## 2022-02-15 DIAGNOSIS — R2689 Other abnormalities of gait and mobility: Secondary | ICD-10-CM | POA: Diagnosis not present

## 2022-02-15 DIAGNOSIS — M6281 Muscle weakness (generalized): Secondary | ICD-10-CM | POA: Diagnosis not present

## 2022-02-15 DIAGNOSIS — Z4789 Encounter for other orthopedic aftercare: Secondary | ICD-10-CM | POA: Diagnosis not present

## 2022-02-16 DIAGNOSIS — N390 Urinary tract infection, site not specified: Secondary | ICD-10-CM | POA: Diagnosis not present

## 2022-02-16 DIAGNOSIS — C641 Malignant neoplasm of right kidney, except renal pelvis: Secondary | ICD-10-CM | POA: Diagnosis not present

## 2022-02-16 DIAGNOSIS — M6281 Muscle weakness (generalized): Secondary | ICD-10-CM | POA: Diagnosis not present

## 2022-02-16 DIAGNOSIS — R2689 Other abnormalities of gait and mobility: Secondary | ICD-10-CM | POA: Diagnosis not present

## 2022-02-16 DIAGNOSIS — Z4789 Encounter for other orthopedic aftercare: Secondary | ICD-10-CM | POA: Diagnosis not present

## 2022-02-16 DIAGNOSIS — I10 Essential (primary) hypertension: Secondary | ICD-10-CM | POA: Diagnosis not present

## 2022-02-16 DIAGNOSIS — C787 Secondary malignant neoplasm of liver and intrahepatic bile duct: Secondary | ICD-10-CM | POA: Diagnosis not present

## 2022-02-16 DIAGNOSIS — R1310 Dysphagia, unspecified: Secondary | ICD-10-CM | POA: Diagnosis not present

## 2022-02-17 DIAGNOSIS — R1310 Dysphagia, unspecified: Secondary | ICD-10-CM | POA: Diagnosis not present

## 2022-02-17 DIAGNOSIS — Z4789 Encounter for other orthopedic aftercare: Secondary | ICD-10-CM | POA: Diagnosis not present

## 2022-02-17 DIAGNOSIS — M6281 Muscle weakness (generalized): Secondary | ICD-10-CM | POA: Diagnosis not present

## 2022-02-17 DIAGNOSIS — R2689 Other abnormalities of gait and mobility: Secondary | ICD-10-CM | POA: Diagnosis not present

## 2022-02-20 DIAGNOSIS — Z4789 Encounter for other orthopedic aftercare: Secondary | ICD-10-CM | POA: Diagnosis not present

## 2022-02-20 DIAGNOSIS — M6281 Muscle weakness (generalized): Secondary | ICD-10-CM | POA: Diagnosis not present

## 2022-02-20 DIAGNOSIS — R2689 Other abnormalities of gait and mobility: Secondary | ICD-10-CM | POA: Diagnosis not present

## 2022-02-21 DIAGNOSIS — R899 Unspecified abnormal finding in specimens from other organs, systems and tissues: Secondary | ICD-10-CM | POA: Diagnosis not present

## 2022-02-21 DIAGNOSIS — I10 Essential (primary) hypertension: Secondary | ICD-10-CM | POA: Diagnosis not present

## 2022-02-21 DIAGNOSIS — Z299 Encounter for prophylactic measures, unspecified: Secondary | ICD-10-CM | POA: Diagnosis not present

## 2022-02-23 DIAGNOSIS — Z299 Encounter for prophylactic measures, unspecified: Secondary | ICD-10-CM | POA: Diagnosis not present

## 2022-02-23 DIAGNOSIS — C641 Malignant neoplasm of right kidney, except renal pelvis: Secondary | ICD-10-CM | POA: Diagnosis not present

## 2022-02-23 DIAGNOSIS — C787 Secondary malignant neoplasm of liver and intrahepatic bile duct: Secondary | ICD-10-CM | POA: Diagnosis not present

## 2022-02-23 DIAGNOSIS — I10 Essential (primary) hypertension: Secondary | ICD-10-CM | POA: Diagnosis not present

## 2022-02-23 DIAGNOSIS — Z66 Do not resuscitate: Secondary | ICD-10-CM | POA: Diagnosis not present

## 2022-02-25 DIAGNOSIS — Z79891 Long term (current) use of opiate analgesic: Secondary | ICD-10-CM | POA: Diagnosis not present

## 2022-02-25 DIAGNOSIS — Z9181 History of falling: Secondary | ICD-10-CM | POA: Diagnosis not present

## 2022-02-25 DIAGNOSIS — Z87440 Personal history of urinary (tract) infections: Secondary | ICD-10-CM | POA: Diagnosis not present

## 2022-02-25 DIAGNOSIS — Z7982 Long term (current) use of aspirin: Secondary | ICD-10-CM | POA: Diagnosis not present

## 2022-02-25 DIAGNOSIS — R32 Unspecified urinary incontinence: Secondary | ICD-10-CM | POA: Diagnosis not present

## 2022-02-25 DIAGNOSIS — N8111 Cystocele, midline: Secondary | ICD-10-CM | POA: Diagnosis not present

## 2022-02-25 DIAGNOSIS — S82892D Other fracture of left lower leg, subsequent encounter for closed fracture with routine healing: Secondary | ICD-10-CM | POA: Diagnosis not present

## 2022-02-25 DIAGNOSIS — M25019 Hemarthrosis, unspecified shoulder: Secondary | ICD-10-CM | POA: Diagnosis not present

## 2022-02-25 DIAGNOSIS — E785 Hyperlipidemia, unspecified: Secondary | ICD-10-CM | POA: Diagnosis not present

## 2022-02-25 DIAGNOSIS — N814 Uterovaginal prolapse, unspecified: Secondary | ICD-10-CM | POA: Diagnosis not present

## 2022-02-25 DIAGNOSIS — C641 Malignant neoplasm of right kidney, except renal pelvis: Secondary | ICD-10-CM | POA: Diagnosis not present

## 2022-02-25 DIAGNOSIS — I10 Essential (primary) hypertension: Secondary | ICD-10-CM | POA: Diagnosis not present

## 2022-02-25 DIAGNOSIS — E871 Hypo-osmolality and hyponatremia: Secondary | ICD-10-CM | POA: Diagnosis not present

## 2022-02-25 DIAGNOSIS — M1990 Unspecified osteoarthritis, unspecified site: Secondary | ICD-10-CM | POA: Diagnosis not present

## 2022-02-25 DIAGNOSIS — C787 Secondary malignant neoplasm of liver and intrahepatic bile duct: Secondary | ICD-10-CM | POA: Diagnosis not present

## 2022-02-25 DIAGNOSIS — D649 Anemia, unspecified: Secondary | ICD-10-CM | POA: Diagnosis not present

## 2022-02-25 DIAGNOSIS — R350 Frequency of micturition: Secondary | ICD-10-CM | POA: Diagnosis not present

## 2022-03-02 DIAGNOSIS — E871 Hypo-osmolality and hyponatremia: Secondary | ICD-10-CM | POA: Diagnosis not present

## 2022-03-02 DIAGNOSIS — M1990 Unspecified osteoarthritis, unspecified site: Secondary | ICD-10-CM | POA: Diagnosis not present

## 2022-03-02 DIAGNOSIS — S82892A Other fracture of left lower leg, initial encounter for closed fracture: Secondary | ICD-10-CM | POA: Diagnosis not present

## 2022-03-02 DIAGNOSIS — I10 Essential (primary) hypertension: Secondary | ICD-10-CM | POA: Diagnosis not present

## 2022-03-02 DIAGNOSIS — Z299 Encounter for prophylactic measures, unspecified: Secondary | ICD-10-CM | POA: Diagnosis not present

## 2022-03-03 DIAGNOSIS — S82892D Other fracture of left lower leg, subsequent encounter for closed fracture with routine healing: Secondary | ICD-10-CM | POA: Diagnosis not present

## 2022-03-08 DIAGNOSIS — S82892D Other fracture of left lower leg, subsequent encounter for closed fracture with routine healing: Secondary | ICD-10-CM | POA: Diagnosis not present

## 2022-03-10 DIAGNOSIS — M1990 Unspecified osteoarthritis, unspecified site: Secondary | ICD-10-CM | POA: Diagnosis not present

## 2022-03-10 DIAGNOSIS — Z87440 Personal history of urinary (tract) infections: Secondary | ICD-10-CM | POA: Diagnosis not present

## 2022-03-10 DIAGNOSIS — Z9181 History of falling: Secondary | ICD-10-CM | POA: Diagnosis not present

## 2022-03-10 DIAGNOSIS — D649 Anemia, unspecified: Secondary | ICD-10-CM | POA: Diagnosis not present

## 2022-03-10 DIAGNOSIS — C787 Secondary malignant neoplasm of liver and intrahepatic bile duct: Secondary | ICD-10-CM | POA: Diagnosis not present

## 2022-03-10 DIAGNOSIS — E871 Hypo-osmolality and hyponatremia: Secondary | ICD-10-CM | POA: Diagnosis not present

## 2022-03-10 DIAGNOSIS — C641 Malignant neoplasm of right kidney, except renal pelvis: Secondary | ICD-10-CM | POA: Diagnosis not present

## 2022-03-10 DIAGNOSIS — Z7982 Long term (current) use of aspirin: Secondary | ICD-10-CM | POA: Diagnosis not present

## 2022-03-10 DIAGNOSIS — R32 Unspecified urinary incontinence: Secondary | ICD-10-CM | POA: Diagnosis not present

## 2022-03-10 DIAGNOSIS — S82892D Other fracture of left lower leg, subsequent encounter for closed fracture with routine healing: Secondary | ICD-10-CM | POA: Diagnosis not present

## 2022-03-10 DIAGNOSIS — N814 Uterovaginal prolapse, unspecified: Secondary | ICD-10-CM | POA: Diagnosis not present

## 2022-03-10 DIAGNOSIS — R350 Frequency of micturition: Secondary | ICD-10-CM | POA: Diagnosis not present

## 2022-03-10 DIAGNOSIS — E785 Hyperlipidemia, unspecified: Secondary | ICD-10-CM | POA: Diagnosis not present

## 2022-03-10 DIAGNOSIS — Z79891 Long term (current) use of opiate analgesic: Secondary | ICD-10-CM | POA: Diagnosis not present

## 2022-03-10 DIAGNOSIS — M25019 Hemarthrosis, unspecified shoulder: Secondary | ICD-10-CM | POA: Diagnosis not present

## 2022-03-10 DIAGNOSIS — I10 Essential (primary) hypertension: Secondary | ICD-10-CM | POA: Diagnosis not present

## 2022-03-10 DIAGNOSIS — N8111 Cystocele, midline: Secondary | ICD-10-CM | POA: Diagnosis not present

## 2022-03-15 DIAGNOSIS — R5383 Other fatigue: Secondary | ICD-10-CM | POA: Diagnosis not present

## 2022-03-15 DIAGNOSIS — Z79899 Other long term (current) drug therapy: Secondary | ICD-10-CM | POA: Diagnosis not present

## 2022-03-15 DIAGNOSIS — E871 Hypo-osmolality and hyponatremia: Secondary | ICD-10-CM | POA: Diagnosis not present

## 2022-03-15 DIAGNOSIS — Z299 Encounter for prophylactic measures, unspecified: Secondary | ICD-10-CM | POA: Diagnosis not present

## 2022-03-15 DIAGNOSIS — I10 Essential (primary) hypertension: Secondary | ICD-10-CM | POA: Diagnosis not present

## 2022-03-16 DIAGNOSIS — I10 Essential (primary) hypertension: Secondary | ICD-10-CM | POA: Diagnosis not present

## 2022-03-16 DIAGNOSIS — Z79891 Long term (current) use of opiate analgesic: Secondary | ICD-10-CM | POA: Diagnosis not present

## 2022-03-16 DIAGNOSIS — S82892D Other fracture of left lower leg, subsequent encounter for closed fracture with routine healing: Secondary | ICD-10-CM | POA: Diagnosis not present

## 2022-03-16 DIAGNOSIS — Z9181 History of falling: Secondary | ICD-10-CM | POA: Diagnosis not present

## 2022-03-16 DIAGNOSIS — Z7982 Long term (current) use of aspirin: Secondary | ICD-10-CM | POA: Diagnosis not present

## 2022-03-16 DIAGNOSIS — C641 Malignant neoplasm of right kidney, except renal pelvis: Secondary | ICD-10-CM | POA: Diagnosis not present

## 2022-03-16 DIAGNOSIS — E871 Hypo-osmolality and hyponatremia: Secondary | ICD-10-CM | POA: Diagnosis not present

## 2022-03-16 DIAGNOSIS — N814 Uterovaginal prolapse, unspecified: Secondary | ICD-10-CM | POA: Diagnosis not present

## 2022-03-16 DIAGNOSIS — C787 Secondary malignant neoplasm of liver and intrahepatic bile duct: Secondary | ICD-10-CM | POA: Diagnosis not present

## 2022-03-16 DIAGNOSIS — E785 Hyperlipidemia, unspecified: Secondary | ICD-10-CM | POA: Diagnosis not present

## 2022-03-16 DIAGNOSIS — Z87440 Personal history of urinary (tract) infections: Secondary | ICD-10-CM | POA: Diagnosis not present

## 2022-03-16 DIAGNOSIS — M25019 Hemarthrosis, unspecified shoulder: Secondary | ICD-10-CM | POA: Diagnosis not present

## 2022-03-16 DIAGNOSIS — M1990 Unspecified osteoarthritis, unspecified site: Secondary | ICD-10-CM | POA: Diagnosis not present

## 2022-03-16 DIAGNOSIS — R32 Unspecified urinary incontinence: Secondary | ICD-10-CM | POA: Diagnosis not present

## 2022-03-16 DIAGNOSIS — D649 Anemia, unspecified: Secondary | ICD-10-CM | POA: Diagnosis not present

## 2022-03-16 DIAGNOSIS — N8111 Cystocele, midline: Secondary | ICD-10-CM | POA: Diagnosis not present

## 2022-03-16 DIAGNOSIS — R350 Frequency of micturition: Secondary | ICD-10-CM | POA: Diagnosis not present

## 2022-03-24 DIAGNOSIS — R3982 Chronic bladder pain: Secondary | ICD-10-CM | POA: Diagnosis not present

## 2022-03-24 DIAGNOSIS — M6281 Muscle weakness (generalized): Secondary | ICD-10-CM | POA: Diagnosis not present

## 2022-03-24 DIAGNOSIS — R5383 Other fatigue: Secondary | ICD-10-CM | POA: Diagnosis not present

## 2022-03-24 DIAGNOSIS — D414 Neoplasm of uncertain behavior of bladder: Secondary | ICD-10-CM | POA: Diagnosis not present

## 2022-03-24 DIAGNOSIS — Z515 Encounter for palliative care: Secondary | ICD-10-CM | POA: Diagnosis not present

## 2022-03-24 DIAGNOSIS — R296 Repeated falls: Secondary | ICD-10-CM | POA: Diagnosis not present

## 2022-03-24 DIAGNOSIS — R54 Age-related physical debility: Secondary | ICD-10-CM | POA: Diagnosis not present

## 2022-03-24 DIAGNOSIS — Z87440 Personal history of urinary (tract) infections: Secondary | ICD-10-CM | POA: Diagnosis not present

## 2022-03-29 DIAGNOSIS — R350 Frequency of micturition: Secondary | ICD-10-CM | POA: Diagnosis not present

## 2022-03-29 DIAGNOSIS — I10 Essential (primary) hypertension: Secondary | ICD-10-CM | POA: Diagnosis not present

## 2022-03-29 DIAGNOSIS — I700 Atherosclerosis of aorta: Secondary | ICD-10-CM | POA: Diagnosis not present

## 2022-03-29 DIAGNOSIS — N390 Urinary tract infection, site not specified: Secondary | ICD-10-CM | POA: Diagnosis not present

## 2022-03-29 DIAGNOSIS — Z299 Encounter for prophylactic measures, unspecified: Secondary | ICD-10-CM | POA: Diagnosis not present

## 2022-04-03 DIAGNOSIS — R1110 Vomiting, unspecified: Secondary | ICD-10-CM | POA: Diagnosis not present

## 2022-04-03 DIAGNOSIS — Z299 Encounter for prophylactic measures, unspecified: Secondary | ICD-10-CM | POA: Diagnosis not present

## 2022-04-03 DIAGNOSIS — D692 Other nonthrombocytopenic purpura: Secondary | ICD-10-CM | POA: Diagnosis not present

## 2022-04-03 DIAGNOSIS — K529 Noninfective gastroenteritis and colitis, unspecified: Secondary | ICD-10-CM | POA: Diagnosis not present

## 2022-04-03 DIAGNOSIS — I700 Atherosclerosis of aorta: Secondary | ICD-10-CM | POA: Diagnosis not present

## 2022-04-19 DIAGNOSIS — R5383 Other fatigue: Secondary | ICD-10-CM | POA: Diagnosis not present

## 2022-04-19 DIAGNOSIS — Z6820 Body mass index (BMI) 20.0-20.9, adult: Secondary | ICD-10-CM | POA: Diagnosis not present

## 2022-04-19 DIAGNOSIS — E7800 Pure hypercholesterolemia, unspecified: Secondary | ICD-10-CM | POA: Diagnosis not present

## 2022-04-19 DIAGNOSIS — Z0000 Encounter for general adult medical examination without abnormal findings: Secondary | ICD-10-CM | POA: Diagnosis not present

## 2022-04-19 DIAGNOSIS — Z79899 Other long term (current) drug therapy: Secondary | ICD-10-CM | POA: Diagnosis not present

## 2022-04-19 DIAGNOSIS — Z299 Encounter for prophylactic measures, unspecified: Secondary | ICD-10-CM | POA: Diagnosis not present

## 2022-05-04 DIAGNOSIS — R630 Anorexia: Secondary | ICD-10-CM | POA: Diagnosis not present

## 2022-05-04 DIAGNOSIS — R5383 Other fatigue: Secondary | ICD-10-CM | POA: Diagnosis not present

## 2022-05-04 DIAGNOSIS — Z681 Body mass index (BMI) 19 or less, adult: Secondary | ICD-10-CM | POA: Diagnosis not present

## 2022-05-04 DIAGNOSIS — Z515 Encounter for palliative care: Secondary | ICD-10-CM | POA: Diagnosis not present

## 2022-05-04 DIAGNOSIS — R54 Age-related physical debility: Secondary | ICD-10-CM | POA: Diagnosis not present

## 2022-05-04 DIAGNOSIS — R634 Abnormal weight loss: Secondary | ICD-10-CM | POA: Diagnosis not present

## 2022-05-04 DIAGNOSIS — D414 Neoplasm of uncertain behavior of bladder: Secondary | ICD-10-CM | POA: Diagnosis not present

## 2022-06-12 DIAGNOSIS — M79674 Pain in right toe(s): Secondary | ICD-10-CM | POA: Diagnosis not present

## 2022-06-12 DIAGNOSIS — B351 Tinea unguium: Secondary | ICD-10-CM | POA: Diagnosis not present

## 2022-06-12 DIAGNOSIS — I739 Peripheral vascular disease, unspecified: Secondary | ICD-10-CM | POA: Diagnosis not present

## 2022-06-12 DIAGNOSIS — M79675 Pain in left toe(s): Secondary | ICD-10-CM | POA: Diagnosis not present

## 2022-06-13 DIAGNOSIS — H353133 Nonexudative age-related macular degeneration, bilateral, advanced atrophic without subfoveal involvement: Secondary | ICD-10-CM | POA: Diagnosis not present

## 2022-06-13 DIAGNOSIS — H524 Presbyopia: Secondary | ICD-10-CM | POA: Diagnosis not present

## 2022-06-13 IMAGING — CT CT RENAL STONE PROTOCOL
2 of 4 series · 14 of 46 positions shown, 16 images · non-contrast
Comparison: None Available.

CLINICAL DATA: Hematuria. History of nephrolithiasis. Hypertension.



[Series 2: axial st · axial · 0.87mm/px · z∈[+649,+1014]mm · 11 of 83 slices shown, 13 images]
[im 5/83  soft-tissue]
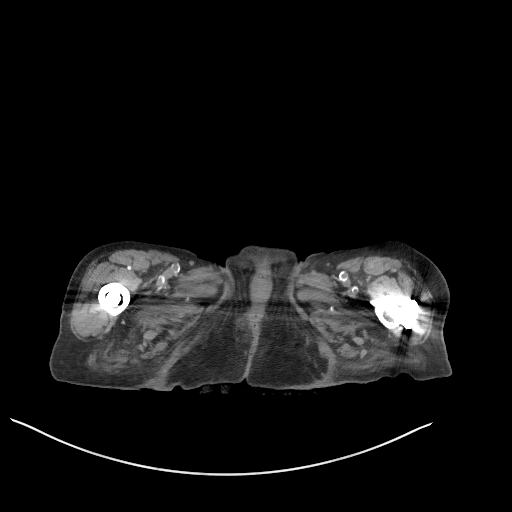
[im 5/83  bone]
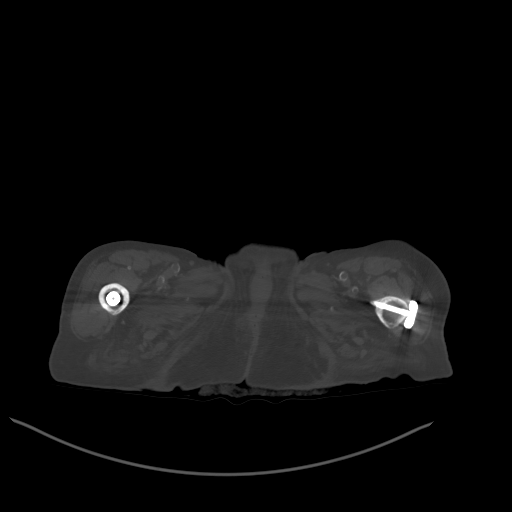
[im 15/83  soft-tissue]
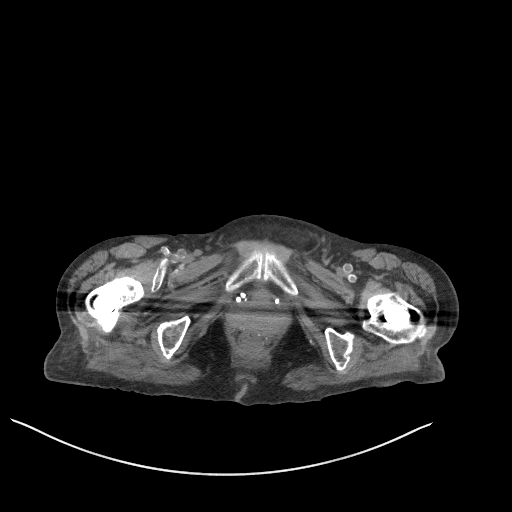
[im 20/83  soft-tissue]
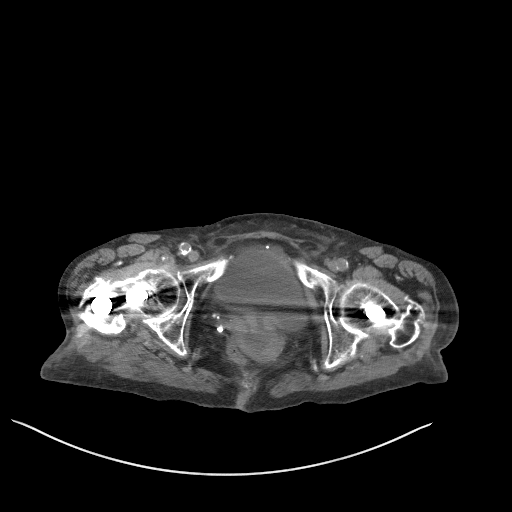
[im 29/83  soft-tissue]
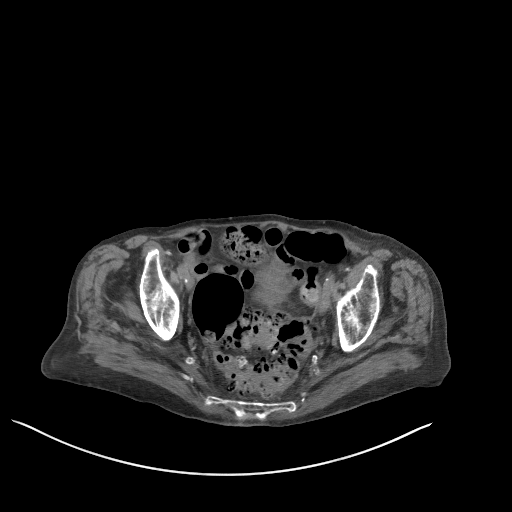
[im 34/83  soft-tissue]
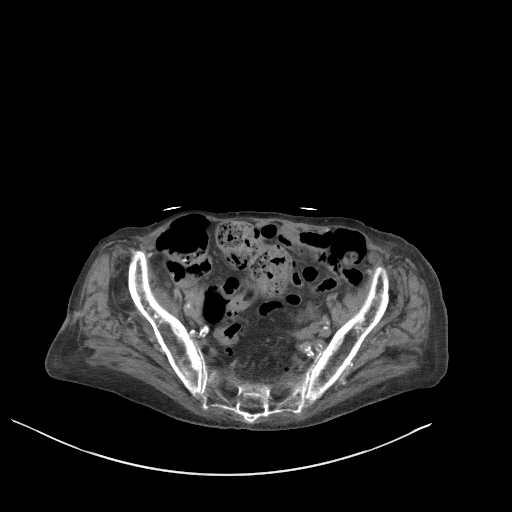
[im 44/83  soft-tissue]
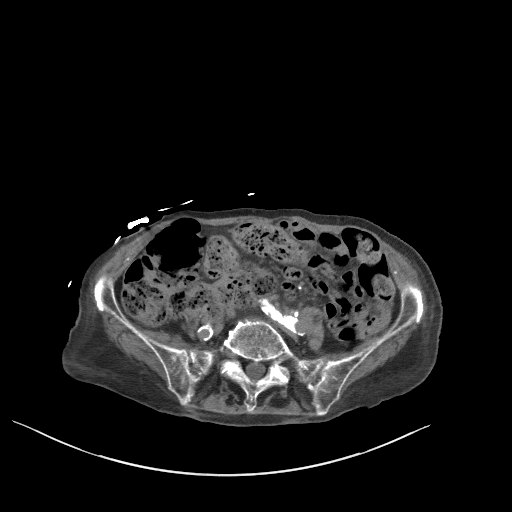
[im 49/83  soft-tissue]
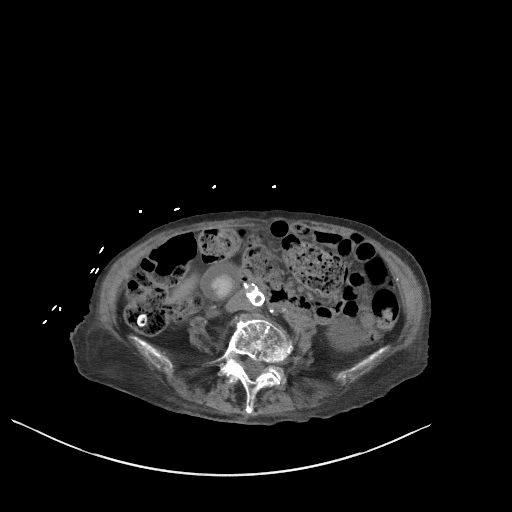
[im 54/83  soft-tissue]
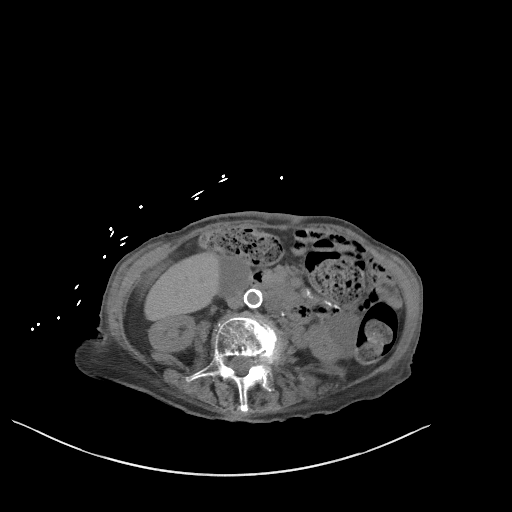
[im 63/83  soft-tissue]
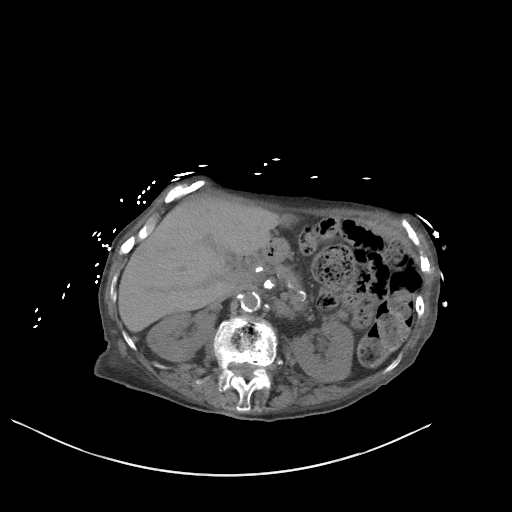
[im 63/83  bone]
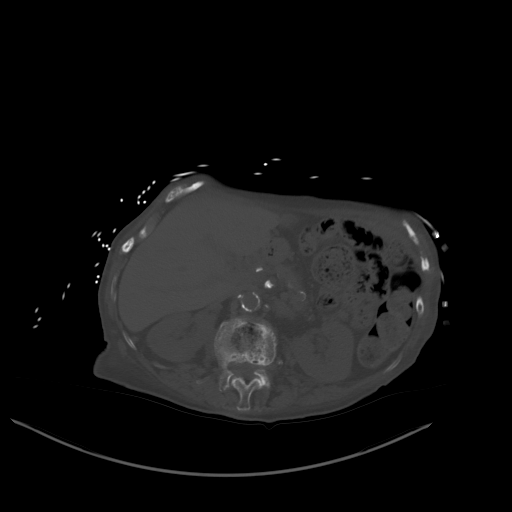
[im 68/83  soft-tissue]
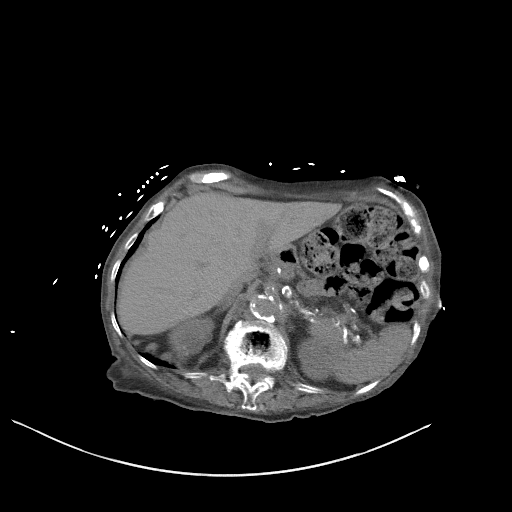
[im 78/83  soft-tissue]
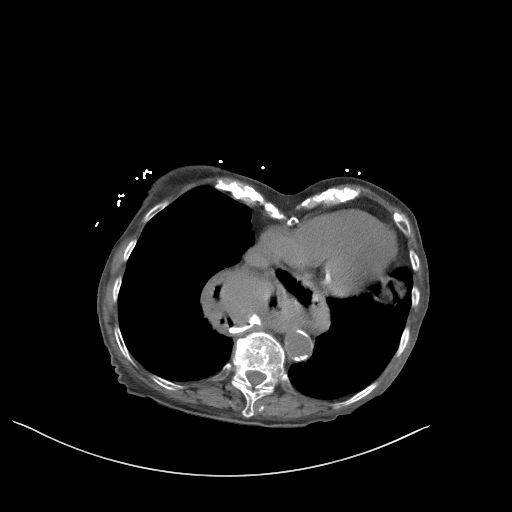

[Series 5: coronal st · coronal · 0.66mm/px · 3 of 84 slices shown]
[im 28/84  soft-tissue]
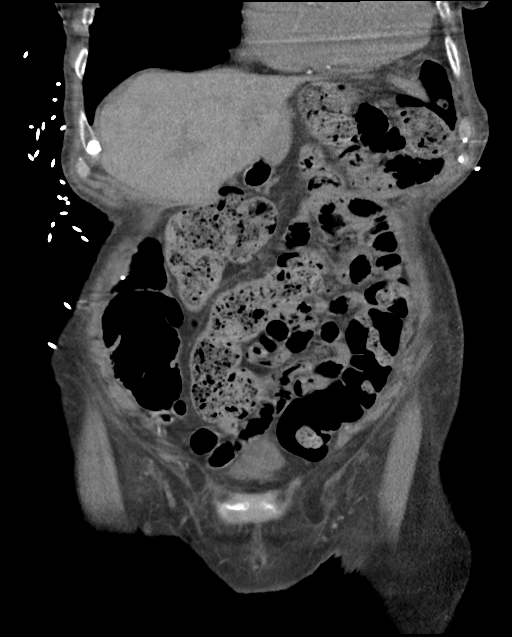
[im 37/84  soft-tissue]
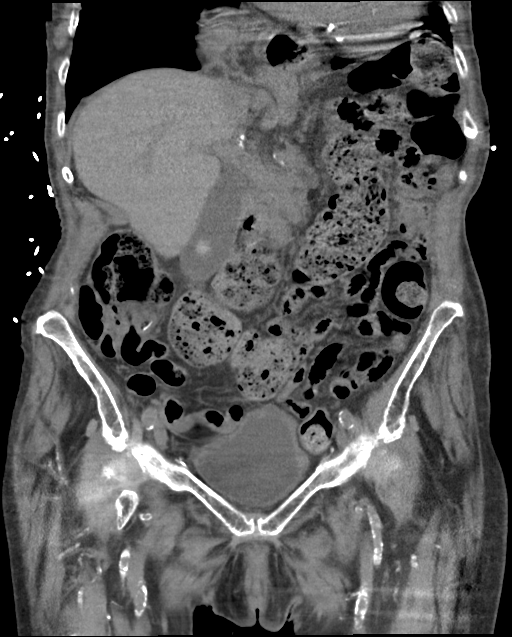
[im 47/84  soft-tissue]
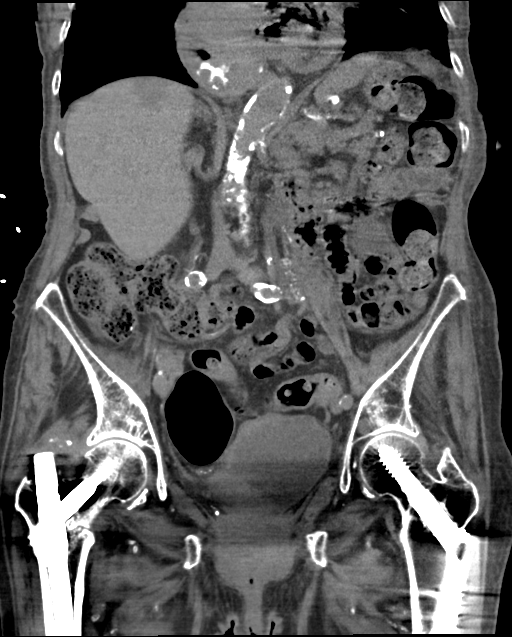

[14 of 46 positions shown; findings below may reference images not displayed]

FINDINGS: Lower chest: Mild motion degradation. Grossly clear lung bases.
Cardiomegaly, accentuated by a pectus excavatum deformity. Moderate
to large hiatal hernia, with greater than [DATE] of the stomach
positioned in the lower chest. Multivessel coronary artery
atherosclerosis.

Hepatobiliary: Normal noncontrast appearance of the liver. A 2.2 cm
gallstone without acute cholecystitis or biliary duct dilatation.

Pancreas: Pancreatic atrophy is within normal variation for age.
There may be a 9 mm pancreatic cystic lesion within the body on
[DATE], but this is of doubtful clinical significance given patient
age. No peripancreatic inflammation.

Spleen: Normal in size, without focal abnormality.

Adrenals/Urinary Tract: Mild bilateral adrenal thickening.

Upper pole right renal 3.2 cm lesion measures greater than fluid
density on [DATE].

Low-density renal lesions are likely cysts including at up to 4.0 cm
exophytic off the lower pole left kidney.

Right renal vascular calcifications. Left renal collecting system
calculi versus renal vascular calcifications. Example interpolar
left kidney at 2 mm.

No hydronephrosis. The ureters are difficult to follow, but there is
no gross hydroureter.

Dependent hyperdense material within the left side of the bladder on
60/2 is presumably hemorrhage. Anterior bladder wall irregularity is
secondary to small saccules.

Stomach/Bowel: Normal remainder of the stomach. Extensive colonic
diverticulosis. Colonic stool burden suggests constipation. Normal
small bowel.

Vascular/Lymphatic: Advanced aortic and branch vessel
atherosclerosis. No abdominopelvic adenopathy.

Reproductive: Artifact degraded evaluation of the pelvis from
proximal femur fixation bilaterally. No adnexal mass. Possible soft
tissue fullness within the uterine body and fundus with central
hypoattenuation including on sagittal image 58.

Other: Pelvic floor laxity. No significant free fluid. No free
intraperitoneal air.

Musculoskeletal: Advanced osteopenia. Lumbosacral spondylosis.
Moderate convex right lumbar spine curvature
IMPRESSION: 1. Left nephrolithiasis versus renal vascular calcifications. No
obstructive uropathy.
2. Multifactorial degradation, primarily involving the pelvis, as
detailed above.
3. Hyperattenuating material in the dependent bladder is likely
hemorrhage. Bladder saccules suggesting a component of outlet
obstruction. Limited evaluation for underlying bladder lesion.
4. Cholelithiasis
5. Possible soft tissue fullness and heterogeneity within the
uterine body and fundus. Suboptimally evaluated secondary to
artifact within the pelvis. Correlate with postmenopausal bleeding
and possibly pelvic ultrasound. Of note, the clinical history
describes prior hysterectomy. If the patient is truly status post
hysterectomy, this soft tissue fullness could alternatively
represent neoplasm either exophytic off or adjacent to the urinary
bladder. Consider contrast enhanced pelvic CT.
6.  Possible constipation.
7. Hiatal hernia
8. Upper pole right renal lesion is technically indeterminate for
hemorrhagic cyst versus solid neoplasm. Given size and patient age,
of doubtful clinical significance. If further evaluation is desired,
renal ultrasound could be performed.

## 2022-06-15 DIAGNOSIS — Z681 Body mass index (BMI) 19 or less, adult: Secondary | ICD-10-CM | POA: Diagnosis not present

## 2022-06-15 DIAGNOSIS — R5383 Other fatigue: Secondary | ICD-10-CM | POA: Diagnosis not present

## 2022-06-15 DIAGNOSIS — Z7409 Other reduced mobility: Secondary | ICD-10-CM | POA: Diagnosis not present

## 2022-06-15 DIAGNOSIS — R54 Age-related physical debility: Secondary | ICD-10-CM | POA: Diagnosis not present

## 2022-06-15 DIAGNOSIS — M6281 Muscle weakness (generalized): Secondary | ICD-10-CM | POA: Diagnosis not present

## 2022-06-15 DIAGNOSIS — Z515 Encounter for palliative care: Secondary | ICD-10-CM | POA: Diagnosis not present

## 2022-06-15 DIAGNOSIS — H3530 Unspecified macular degeneration: Secondary | ICD-10-CM | POA: Diagnosis not present

## 2022-07-25 DIAGNOSIS — C641 Malignant neoplasm of right kidney, except renal pelvis: Secondary | ICD-10-CM | POA: Diagnosis not present

## 2022-07-25 DIAGNOSIS — I700 Atherosclerosis of aorta: Secondary | ICD-10-CM | POA: Diagnosis not present

## 2022-07-25 DIAGNOSIS — I10 Essential (primary) hypertension: Secondary | ICD-10-CM | POA: Diagnosis not present

## 2022-07-25 DIAGNOSIS — Z1331 Encounter for screening for depression: Secondary | ICD-10-CM | POA: Diagnosis not present

## 2022-07-25 DIAGNOSIS — Z299 Encounter for prophylactic measures, unspecified: Secondary | ICD-10-CM | POA: Diagnosis not present

## 2022-07-25 DIAGNOSIS — Z1339 Encounter for screening examination for other mental health and behavioral disorders: Secondary | ICD-10-CM | POA: Diagnosis not present

## 2022-07-25 DIAGNOSIS — Z7189 Other specified counseling: Secondary | ICD-10-CM | POA: Diagnosis not present

## 2022-07-25 DIAGNOSIS — Z0000 Encounter for general adult medical examination without abnormal findings: Secondary | ICD-10-CM | POA: Diagnosis not present

## 2022-07-26 DIAGNOSIS — R54 Age-related physical debility: Secondary | ICD-10-CM | POA: Diagnosis not present

## 2022-07-26 DIAGNOSIS — Z7409 Other reduced mobility: Secondary | ICD-10-CM | POA: Diagnosis not present

## 2022-07-26 DIAGNOSIS — Z515 Encounter for palliative care: Secondary | ICD-10-CM | POA: Diagnosis not present

## 2022-07-26 DIAGNOSIS — R5383 Other fatigue: Secondary | ICD-10-CM | POA: Diagnosis not present

## 2022-07-26 DIAGNOSIS — Z681 Body mass index (BMI) 19 or less, adult: Secondary | ICD-10-CM | POA: Diagnosis not present

## 2022-07-26 DIAGNOSIS — M6281 Muscle weakness (generalized): Secondary | ICD-10-CM | POA: Diagnosis not present

## 2022-07-31 DIAGNOSIS — I10 Essential (primary) hypertension: Secondary | ICD-10-CM | POA: Diagnosis not present

## 2022-07-31 DIAGNOSIS — Z299 Encounter for prophylactic measures, unspecified: Secondary | ICD-10-CM | POA: Diagnosis not present

## 2022-07-31 DIAGNOSIS — R2681 Unsteadiness on feet: Secondary | ICD-10-CM | POA: Diagnosis not present

## 2022-07-31 DIAGNOSIS — M1990 Unspecified osteoarthritis, unspecified site: Secondary | ICD-10-CM | POA: Diagnosis not present

## 2022-08-04 DIAGNOSIS — Z961 Presence of intraocular lens: Secondary | ICD-10-CM | POA: Diagnosis not present

## 2022-08-04 DIAGNOSIS — H353113 Nonexudative age-related macular degeneration, right eye, advanced atrophic without subfoveal involvement: Secondary | ICD-10-CM | POA: Diagnosis not present

## 2022-09-06 DIAGNOSIS — Z7409 Other reduced mobility: Secondary | ICD-10-CM | POA: Diagnosis not present

## 2022-09-06 DIAGNOSIS — Z515 Encounter for palliative care: Secondary | ICD-10-CM | POA: Diagnosis not present

## 2022-09-06 DIAGNOSIS — E46 Unspecified protein-calorie malnutrition: Secondary | ICD-10-CM | POA: Diagnosis not present

## 2022-09-11 DIAGNOSIS — B351 Tinea unguium: Secondary | ICD-10-CM | POA: Diagnosis not present

## 2022-09-11 DIAGNOSIS — I739 Peripheral vascular disease, unspecified: Secondary | ICD-10-CM | POA: Diagnosis not present

## 2022-09-19 DIAGNOSIS — R109 Unspecified abdominal pain: Secondary | ICD-10-CM | POA: Diagnosis not present

## 2022-09-19 DIAGNOSIS — N390 Urinary tract infection, site not specified: Secondary | ICD-10-CM | POA: Diagnosis not present

## 2022-09-19 DIAGNOSIS — I10 Essential (primary) hypertension: Secondary | ICD-10-CM | POA: Diagnosis not present

## 2022-09-19 DIAGNOSIS — K869 Disease of pancreas, unspecified: Secondary | ICD-10-CM | POA: Diagnosis not present

## 2022-09-19 DIAGNOSIS — R1031 Right lower quadrant pain: Secondary | ICD-10-CM | POA: Diagnosis not present

## 2022-09-19 DIAGNOSIS — K449 Diaphragmatic hernia without obstruction or gangrene: Secondary | ICD-10-CM | POA: Diagnosis not present

## 2022-09-19 DIAGNOSIS — N281 Cyst of kidney, acquired: Secondary | ICD-10-CM | POA: Diagnosis not present

## 2022-09-19 DIAGNOSIS — Z7982 Long term (current) use of aspirin: Secondary | ICD-10-CM | POA: Diagnosis not present

## 2022-09-19 DIAGNOSIS — K5730 Diverticulosis of large intestine without perforation or abscess without bleeding: Secondary | ICD-10-CM | POA: Diagnosis not present

## 2022-09-19 DIAGNOSIS — Z79899 Other long term (current) drug therapy: Secondary | ICD-10-CM | POA: Diagnosis not present

## 2022-09-19 DIAGNOSIS — R300 Dysuria: Secondary | ICD-10-CM | POA: Diagnosis not present

## 2022-09-19 DIAGNOSIS — I517 Cardiomegaly: Secondary | ICD-10-CM | POA: Diagnosis not present

## 2022-09-19 DIAGNOSIS — Z87891 Personal history of nicotine dependence: Secondary | ICD-10-CM | POA: Diagnosis not present

## 2022-09-19 DIAGNOSIS — R32 Unspecified urinary incontinence: Secondary | ICD-10-CM | POA: Diagnosis not present

## 2022-09-19 DIAGNOSIS — R110 Nausea: Secondary | ICD-10-CM | POA: Diagnosis not present

## 2022-09-19 DIAGNOSIS — Z299 Encounter for prophylactic measures, unspecified: Secondary | ICD-10-CM | POA: Diagnosis not present

## 2022-09-19 DIAGNOSIS — Z7722 Contact with and (suspected) exposure to environmental tobacco smoke (acute) (chronic): Secondary | ICD-10-CM | POA: Diagnosis not present

## 2022-09-19 DIAGNOSIS — K8020 Calculus of gallbladder without cholecystitis without obstruction: Secondary | ICD-10-CM | POA: Diagnosis not present

## 2022-09-19 DIAGNOSIS — Z791 Long term (current) use of non-steroidal anti-inflammatories (NSAID): Secondary | ICD-10-CM | POA: Diagnosis not present

## 2022-09-27 ENCOUNTER — Telehealth: Payer: Self-pay

## 2022-09-27 DIAGNOSIS — R109 Unspecified abdominal pain: Secondary | ICD-10-CM | POA: Diagnosis not present

## 2022-09-27 DIAGNOSIS — K8689 Other specified diseases of pancreas: Secondary | ICD-10-CM | POA: Diagnosis not present

## 2022-09-27 DIAGNOSIS — R32 Unspecified urinary incontinence: Secondary | ICD-10-CM | POA: Diagnosis not present

## 2022-09-27 DIAGNOSIS — I10 Essential (primary) hypertension: Secondary | ICD-10-CM | POA: Diagnosis not present

## 2022-09-27 DIAGNOSIS — Z299 Encounter for prophylactic measures, unspecified: Secondary | ICD-10-CM | POA: Diagnosis not present

## 2022-09-27 DIAGNOSIS — I700 Atherosclerosis of aorta: Secondary | ICD-10-CM | POA: Diagnosis not present

## 2022-09-27 NOTE — Telephone Encounter (Signed)
Patient is needing a referral to have her prolapsed bladder treated.  She has been having the issues for 2 years.  It is getting worse.  Please advise patient of referral and next step.

## 2022-09-27 NOTE — Telephone Encounter (Signed)
Returned call with no answer. Message left to return call. 

## 2022-10-03 NOTE — Telephone Encounter (Signed)
Patients states she does not have a preference.

## 2022-10-06 ENCOUNTER — Other Ambulatory Visit: Payer: Self-pay | Admitting: Urology

## 2022-10-06 DIAGNOSIS — N819 Female genital prolapse, unspecified: Secondary | ICD-10-CM

## 2022-10-08 DIAGNOSIS — I10 Essential (primary) hypertension: Secondary | ICD-10-CM | POA: Diagnosis not present

## 2022-10-08 DIAGNOSIS — M19041 Primary osteoarthritis, right hand: Secondary | ICD-10-CM | POA: Diagnosis not present

## 2022-10-08 DIAGNOSIS — W1839XA Other fall on same level, initial encounter: Secondary | ICD-10-CM | POA: Diagnosis not present

## 2022-10-08 DIAGNOSIS — S022XXA Fracture of nasal bones, initial encounter for closed fracture: Secondary | ICD-10-CM | POA: Diagnosis not present

## 2022-10-08 DIAGNOSIS — Z87891 Personal history of nicotine dependence: Secondary | ICD-10-CM | POA: Diagnosis not present

## 2022-10-08 DIAGNOSIS — M7989 Other specified soft tissue disorders: Secondary | ICD-10-CM | POA: Diagnosis not present

## 2022-10-08 DIAGNOSIS — M85841 Other specified disorders of bone density and structure, right hand: Secondary | ICD-10-CM | POA: Diagnosis not present

## 2022-10-08 DIAGNOSIS — R519 Headache, unspecified: Secondary | ICD-10-CM | POA: Diagnosis not present

## 2022-10-08 DIAGNOSIS — M1990 Unspecified osteoarthritis, unspecified site: Secondary | ICD-10-CM | POA: Diagnosis not present

## 2022-10-08 DIAGNOSIS — S0240CA Maxillary fracture, right side, initial encounter for closed fracture: Secondary | ICD-10-CM | POA: Diagnosis not present

## 2022-10-09 NOTE — Telephone Encounter (Signed)
Patient called with no answer. Message left on voicemail.

## 2022-10-10 DIAGNOSIS — Z299 Encounter for prophylactic measures, unspecified: Secondary | ICD-10-CM | POA: Diagnosis not present

## 2022-10-10 DIAGNOSIS — I10 Essential (primary) hypertension: Secondary | ICD-10-CM | POA: Diagnosis not present

## 2022-10-10 DIAGNOSIS — S022XXA Fracture of nasal bones, initial encounter for closed fracture: Secondary | ICD-10-CM | POA: Diagnosis not present

## 2022-10-20 DIAGNOSIS — C7900 Secondary malignant neoplasm of unspecified kidney and renal pelvis: Secondary | ICD-10-CM | POA: Diagnosis not present

## 2022-10-20 DIAGNOSIS — E440 Moderate protein-calorie malnutrition: Secondary | ICD-10-CM | POA: Diagnosis not present

## 2022-10-20 DIAGNOSIS — Z515 Encounter for palliative care: Secondary | ICD-10-CM | POA: Diagnosis not present

## 2022-10-20 DIAGNOSIS — C259 Malignant neoplasm of pancreas, unspecified: Secondary | ICD-10-CM | POA: Diagnosis not present

## 2022-10-31 DIAGNOSIS — R350 Frequency of micturition: Secondary | ICD-10-CM | POA: Diagnosis not present

## 2022-10-31 DIAGNOSIS — N3946 Mixed incontinence: Secondary | ICD-10-CM | POA: Diagnosis not present

## 2022-10-31 DIAGNOSIS — S022XXA Fracture of nasal bones, initial encounter for closed fracture: Secondary | ICD-10-CM | POA: Diagnosis not present

## 2022-10-31 DIAGNOSIS — I10 Essential (primary) hypertension: Secondary | ICD-10-CM | POA: Diagnosis not present

## 2022-10-31 DIAGNOSIS — R310 Gross hematuria: Secondary | ICD-10-CM | POA: Diagnosis not present

## 2022-10-31 DIAGNOSIS — Z299 Encounter for prophylactic measures, unspecified: Secondary | ICD-10-CM | POA: Diagnosis not present

## 2022-12-05 ENCOUNTER — Encounter (HOSPITAL_COMMUNITY): Payer: Self-pay

## 2022-12-05 ENCOUNTER — Other Ambulatory Visit: Payer: Self-pay

## 2022-12-05 ENCOUNTER — Emergency Department (HOSPITAL_COMMUNITY)
Admission: EM | Admit: 2022-12-05 | Discharge: 2022-12-05 | Disposition: A | Discharge status: Home / Self Care | Payer: HMO | Source: Home / Self Care | Attending: Emergency Medicine | Admitting: Emergency Medicine

## 2022-12-05 DIAGNOSIS — Z7982 Long term (current) use of aspirin: Secondary | ICD-10-CM | POA: Insufficient documentation

## 2022-12-05 DIAGNOSIS — N8110 Cystocele, unspecified: Secondary | ICD-10-CM | POA: Diagnosis not present

## 2022-12-05 DIAGNOSIS — C787 Secondary malignant neoplasm of liver and intrahepatic bile duct: Secondary | ICD-10-CM | POA: Diagnosis not present

## 2022-12-05 DIAGNOSIS — Z299 Encounter for prophylactic measures, unspecified: Secondary | ICD-10-CM | POA: Diagnosis not present

## 2022-12-05 DIAGNOSIS — C641 Malignant neoplasm of right kidney, except renal pelvis: Secondary | ICD-10-CM | POA: Diagnosis not present

## 2022-12-05 DIAGNOSIS — N390 Urinary tract infection, site not specified: Secondary | ICD-10-CM | POA: Diagnosis not present

## 2022-12-05 DIAGNOSIS — N813 Complete uterovaginal prolapse: Secondary | ICD-10-CM | POA: Diagnosis not present

## 2022-12-05 DIAGNOSIS — K311 Adult hypertrophic pyloric stenosis: Secondary | ICD-10-CM | POA: Diagnosis not present

## 2022-12-05 DIAGNOSIS — R32 Unspecified urinary incontinence: Secondary | ICD-10-CM | POA: Diagnosis not present

## 2022-12-05 LAB — URINALYSIS, W/ REFLEX TO CULTURE (INFECTION SUSPECTED)
Bacteria, UA: NONE SEEN
Bilirubin Urine: NEGATIVE
Glucose, UA: NEGATIVE mg/dL
Ketones, ur: NEGATIVE mg/dL
Nitrite: NEGATIVE
Protein, ur: >=300 mg/dL — AB
RBC / HPF: >50 RBC/hpf (ref 0–5)
Specific Gravity, Urine: 1.017 (ref 1.005–1.030)
WBC, UA: >50 WBC/hpf (ref 0–5)
pH: 7 (ref 5.0–8.0)

## 2022-12-05 MED ORDER — TRAMADOL HCL 50 MG PO TABS
50.0000 mg | ORAL_TABLET | Freq: Four times a day (QID) | ORAL | 0 refills | Status: DC | PRN
Start: 2022-12-05 — End: 2022-12-13

## 2022-12-05 MED ORDER — OXYCODONE-ACETAMINOPHEN 5-325 MG PO TABS
1.0000 | ORAL_TABLET | Freq: Once | ORAL | Status: AC
  Administered 2022-12-05: 1 via ORAL
  Filled 2022-12-05: qty 1

## 2022-12-05 MED ORDER — CEPHALEXIN 500 MG PO CAPS: 500.0000 mg | ORAL_CAPSULE | Freq: Two times a day (BID) | ORAL | 0 refills | Status: DC

## 2022-12-05 MED ORDER — CEPHALEXIN 500 MG PO CAPS
500.0000 mg | ORAL_CAPSULE | Freq: Once | ORAL | Status: AC
  Administered 2022-12-05: 500 mg via ORAL
  Filled 2022-12-05: qty 1

## 2022-12-05 NOTE — ED Triage Notes (Signed)
Pt arrived reporting she was seen at her doctor and was told her bladder is falling out. Patient states pain in lower abdomen. Denies any other symptoms. Patient AAOx4, tearful in triage

## 2022-12-05 NOTE — ED Triage Notes (Addendum)
P

## 2022-12-05 NOTE — Discharge Instructions (Addendum)
You have bladder prolapse.   You also have urinary tract infection.  Please take Keflex as prescribed.  Please take tramadol for pain  I recommend that you follow-up with GYN doctor for pessary  Return to ER if you have severe pain, unable to urinate.

## 2022-12-05 NOTE — ED Provider Notes (Signed)
[facility] [facility] EMERGENCY DEPARTMENT AT [facility] Provider Note   CSN: [id number] Arrival date & time: 12/05/22  1503     History  Chief Complaint  Patient presents with   bladder problem    Laura Cook is a 89 y.o. female history of bladder prolapse, pancreatic mass, here presenting with bladder prolapse.  Patient had a bladder prolapse that was diagnosed previously.  It was reduced in the ER in July.  Patient apparently was referred to urology but she was told that no intervention was recommended.  Patient states that she has persistent bladder prolapse.  Patient has been followed with Dr. Clelia Croft, her PCP.  Patient states that she is in a lot of pain and want to discuss some options.  The history is provided by the patient.       Home Medications Prior to Admission medications   Medication Sig Start Date End Date Taking? Authorizing Provider  acetaminophen (TYLENOL) 325 MG tablet Take 2 tablets (650 mg total) by mouth every 6 (six) hours as needed for mild pain (or Fever >/= 101). 11/18/20   Emokpae, Courage, MD  amLODipine (NORVASC) 5 MG tablet Take 5 mg by mouth daily. 11/11/20   [provider]  aspirin EC 325 MG tablet Take 1 tablet (325 mg total) by mouth daily with breakfast. 11/18/20   Shon Hale, MD  baclofen (LIORESAL) 10 MG tablet Take 10 mg by mouth 2 (two) times daily. Patient not taking: Reported on 10/18/2021 06/28/21   [provider]  Cholecalciferol (VITAMIN D3 PO) Take by mouth.    [provider]  diclofenac Sodium (VOLTAREN) 1 % GEL Apply 2 g topically 2 (two) times daily.    [provider]  hydrochlorothiazide (HYDRODIURIL) 50 MG tablet Take 50 mg by mouth every morning. 05/06/21   [provider]  ketotifen (ZADITOR) 0.025 % ophthalmic solution Place 1 drop into both eyes 2 (two) times daily.    [provider]  meclizine (ANTIVERT) 25 MG tablet Take 25 mg by mouth every 8 (eight) hours as  needed. 07/01/20   [provider]  meloxicam (MOBIC) 15 MG tablet Take 15 mg by mouth daily. 10/14/21   [provider]  metoprolol succinate (TOPROL XL) 50 MG 24 hr tablet Take 1 tablet (50 mg total) by mouth daily. Take with or immediately following a meal. 11/18/20 11/18/21  Shon Hale, MD  Multiple Vitamins-Minerals (PRESERVISION AREDS 2 PO) Take by mouth.    [provider]  omeprazole (PRILOSEC) 20 MG capsule Take 20 mg by mouth daily.    [provider]  polyethylene glycol (MIRALAX) 17 g packet Take 17 g by mouth daily. 11/18/20   Shon Hale, MD  PREMARIN vaginal cream Place 1 g vaginally daily. 09/15/21   [provider]  rosuvastatin (CRESTOR) 10 MG tablet Take 10 mg by mouth at bedtime. 05/14/21   [provider]  sulfamethoxazole-trimethoprim (BACTRIM DS) 800-160 MG tablet Take 1 tablet by mouth 2 (two) times daily. 10/17/21   [provider]      Allergies    Patient has no known allergies.    Review of Systems   Review of Systems  Genitourinary:        Bladder prolapse  All other systems reviewed and are negative.   Physical Exam Updated Vital Signs BP (!) 155/85 (BP Location: Left Arm)   Pulse 78   Temp 98.1 F (36.7 C) (Oral)   Resp 16   Ht 5'  1" (1.549 m)   Wt 44.9 kg   SpO2 100%   BMI 18.70 kg/m  Physical Exam Vitals and nursing note reviewed.  Constitutional:      Appearance: Normal appearance.     Comments: Chronically ill and uncomfortable  HENT:     Head: Normocephalic.     Nose: Nose normal.     Mouth/Throat:     Mouth: Mucous membranes are moist.  Eyes:     Extraocular Movements: Extraocular movements intact.     Pupils: Pupils are equal, round, and reactive to light.  Cardiovascular:     Rate and Rhythm: Normal rate.     Pulses: Normal pulses.  Pulmonary:     Effort: Pulmonary effort is normal.     Breath sounds: Normal breath sounds.  Abdominal:     General: Abdomen is  flat.     Palpations: Abdomen is soft.  Genitourinary:    Comments: Obvious bladder prolapse that is easily reducible Musculoskeletal:     Cervical back: Normal range of motion and neck supple.  Skin:    General: Skin is warm.     Capillary Refill: Capillary refill takes less than 2 seconds.  Neurological:     General: No focal deficit present.     Mental Status: She is alert and oriented to person, place, and time.  Psychiatric:        Mood and Affect: Mood normal.        Behavior: Behavior normal.     ED Results / Procedures / Treatments   Labs (all labs ordered are listed, but only abnormal results are displayed) Labs Reviewed  URINALYSIS, W/ REFLEX TO CULTURE (INFECTION SUSPECTED)    EKG None  Radiology No results found.  Procedures Procedures    Medications Ordered in ED Medications  oxyCODONE-acetaminophen (PERCOCET/ROXICET) 5-325 MG per tablet 1 tablet (1 tablet Oral Given 12/05/22 1639)    ED Course/ Medical Decision Making/ A&P                                 Medical Decision Making Anyla R Hendricksen is a 89 y.o. female here with bladder prolapse.  The prolapse is easily reproducible.  Will check urinalysis.  Patient has been follow-up with urology.  May need to refer to GYN for pessary placement.    6:46 PM UA showed moderate leuk and > 50 WBC. Will dc home with keflex. Will refer to GYN for pessary   Problems Addressed: Female bladder prolapse: chronic illness or injury with exacerbation, progression, or side effects of treatment Urinary tract infection without hematuria, site unspecified: acute illness or injury  Amount and/or Complexity of Data Reviewed Labs: ordered. Decision-making details documented in ED Course.  Risk Prescription drug management.    Final Clinical Impression(s) / ED Diagnoses Final diagnoses:  None    Rx / DC Orders ED Discharge Orders     None         Charlynne Pander, MD 12/05/22 [place]

## 2022-12-06 LAB — URINE CULTURE: Culture: <10000 — AB

## 2022-12-07 ENCOUNTER — Emergency Department (HOSPITAL_COMMUNITY): Payer: HMO

## 2022-12-07 ENCOUNTER — Inpatient Hospital Stay (HOSPITAL_COMMUNITY)
Admission: EM | Admit: 2022-12-07 | Discharge: 2022-12-14 | DRG: 380 | Disposition: A | Discharge status: Skilled Nursing Facility | Payer: HMO | Source: Other Acute Inpatient Hospital | Attending: Internal Medicine | Admitting: Internal Medicine

## 2022-12-07 ENCOUNTER — Other Ambulatory Visit: Payer: Self-pay

## 2022-12-07 ENCOUNTER — Encounter (HOSPITAL_COMMUNITY): Payer: Self-pay

## 2022-12-07 DIAGNOSIS — R112 Nausea with vomiting, unspecified: Secondary | ICD-10-CM | POA: Diagnosis not present

## 2022-12-07 DIAGNOSIS — K2100 Gastro-esophageal reflux disease with esophagitis, without bleeding: Secondary | ICD-10-CM | POA: Diagnosis not present

## 2022-12-07 DIAGNOSIS — Z960 Presence of urogenital implants: Secondary | ICD-10-CM | POA: Diagnosis present

## 2022-12-07 DIAGNOSIS — R109 Unspecified abdominal pain: Secondary | ICD-10-CM | POA: Diagnosis not present

## 2022-12-07 DIAGNOSIS — K5730 Diverticulosis of large intestine without perforation or abscess without bleeding: Secondary | ICD-10-CM | POA: Diagnosis not present

## 2022-12-07 DIAGNOSIS — Z79899 Other long term (current) drug therapy: Secondary | ICD-10-CM

## 2022-12-07 DIAGNOSIS — E785 Hyperlipidemia, unspecified: Secondary | ICD-10-CM | POA: Diagnosis present

## 2022-12-07 DIAGNOSIS — Z7982 Long term (current) use of aspirin: Secondary | ICD-10-CM

## 2022-12-07 DIAGNOSIS — M1990 Unspecified osteoarthritis, unspecified site: Secondary | ICD-10-CM | POA: Diagnosis not present

## 2022-12-07 DIAGNOSIS — R2689 Other abnormalities of gait and mobility: Secondary | ICD-10-CM | POA: Diagnosis not present

## 2022-12-07 DIAGNOSIS — K8020 Calculus of gallbladder without cholecystitis without obstruction: Secondary | ICD-10-CM | POA: Diagnosis present

## 2022-12-07 DIAGNOSIS — R1084 Generalized abdominal pain: Secondary | ICD-10-CM

## 2022-12-07 DIAGNOSIS — Z90710 Acquired absence of both cervix and uterus: Secondary | ICD-10-CM | POA: Diagnosis not present

## 2022-12-07 DIAGNOSIS — F05 Delirium due to known physiological condition: Secondary | ICD-10-CM | POA: Diagnosis present

## 2022-12-07 DIAGNOSIS — R41841 Cognitive communication deficit: Secondary | ICD-10-CM | POA: Diagnosis not present

## 2022-12-07 DIAGNOSIS — N814 Uterovaginal prolapse, unspecified: Secondary | ICD-10-CM | POA: Diagnosis not present

## 2022-12-07 DIAGNOSIS — Z791 Long term (current) use of non-steroidal anti-inflammatories (NSAID): Secondary | ICD-10-CM

## 2022-12-07 DIAGNOSIS — R636 Underweight: Secondary | ICD-10-CM | POA: Diagnosis present

## 2022-12-07 DIAGNOSIS — G9341 Metabolic encephalopathy: Secondary | ICD-10-CM | POA: Diagnosis not present

## 2022-12-07 DIAGNOSIS — I10 Essential (primary) hypertension: Secondary | ICD-10-CM | POA: Diagnosis not present

## 2022-12-07 DIAGNOSIS — M62562 Muscle wasting and atrophy, not elsewhere classified, left lower leg: Secondary | ICD-10-CM | POA: Diagnosis not present

## 2022-12-07 DIAGNOSIS — D72829 Elevated white blood cell count, unspecified: Secondary | ICD-10-CM | POA: Diagnosis present

## 2022-12-07 DIAGNOSIS — Z4682 Encounter for fitting and adjustment of non-vascular catheter: Secondary | ICD-10-CM | POA: Diagnosis not present

## 2022-12-07 DIAGNOSIS — N993 Prolapse of vaginal vault after hysterectomy: Secondary | ICD-10-CM | POA: Diagnosis not present

## 2022-12-07 DIAGNOSIS — R000 Tachycardia, unspecified: Secondary | ICD-10-CM | POA: Diagnosis not present

## 2022-12-07 DIAGNOSIS — N8110 Cystocele, unspecified: Secondary | ICD-10-CM | POA: Diagnosis not present

## 2022-12-07 DIAGNOSIS — Z800 Family history of malignant neoplasm of digestive organs: Secondary | ICD-10-CM

## 2022-12-07 DIAGNOSIS — R9431 Abnormal electrocardiogram [ECG] [EKG]: Secondary | ICD-10-CM | POA: Diagnosis not present

## 2022-12-07 DIAGNOSIS — F0390 Unspecified dementia without behavioral disturbance: Secondary | ICD-10-CM | POA: Diagnosis present

## 2022-12-07 DIAGNOSIS — Z7189 Other specified counseling: Secondary | ICD-10-CM | POA: Diagnosis not present

## 2022-12-07 DIAGNOSIS — I517 Cardiomegaly: Secondary | ICD-10-CM | POA: Diagnosis not present

## 2022-12-07 DIAGNOSIS — K449 Diaphragmatic hernia without obstruction or gangrene: Secondary | ICD-10-CM | POA: Diagnosis not present

## 2022-12-07 DIAGNOSIS — C641 Malignant neoplasm of right kidney, except renal pelvis: Secondary | ICD-10-CM | POA: Diagnosis present

## 2022-12-07 DIAGNOSIS — D638 Anemia in other chronic diseases classified elsewhere: Secondary | ICD-10-CM | POA: Diagnosis not present

## 2022-12-07 DIAGNOSIS — Z79890 Hormone replacement therapy (postmenopausal): Secondary | ICD-10-CM | POA: Diagnosis not present

## 2022-12-07 DIAGNOSIS — E876 Hypokalemia: Secondary | ICD-10-CM | POA: Diagnosis not present

## 2022-12-07 DIAGNOSIS — N390 Urinary tract infection, site not specified: Secondary | ICD-10-CM

## 2022-12-07 DIAGNOSIS — D509 Iron deficiency anemia, unspecified: Secondary | ICD-10-CM | POA: Diagnosis not present

## 2022-12-07 DIAGNOSIS — N2889 Other specified disorders of kidney and ureter: Secondary | ICD-10-CM | POA: Diagnosis not present

## 2022-12-07 DIAGNOSIS — R1111 Vomiting without nausea: Secondary | ICD-10-CM | POA: Diagnosis not present

## 2022-12-07 DIAGNOSIS — R9389 Abnormal findings on diagnostic imaging of other specified body structures: Secondary | ICD-10-CM | POA: Diagnosis not present

## 2022-12-07 DIAGNOSIS — R0689 Other abnormalities of breathing: Secondary | ICD-10-CM | POA: Diagnosis not present

## 2022-12-07 DIAGNOSIS — N8184 Pelvic muscle wasting: Secondary | ICD-10-CM | POA: Diagnosis not present

## 2022-12-07 DIAGNOSIS — D75839 Thrombocytosis, unspecified: Secondary | ICD-10-CM | POA: Diagnosis present

## 2022-12-07 DIAGNOSIS — E871 Hypo-osmolality and hyponatremia: Secondary | ICD-10-CM | POA: Diagnosis not present

## 2022-12-07 DIAGNOSIS — Z515 Encounter for palliative care: Secondary | ICD-10-CM

## 2022-12-07 DIAGNOSIS — Z66 Do not resuscitate: Secondary | ICD-10-CM | POA: Diagnosis not present

## 2022-12-07 DIAGNOSIS — Z7401 Bed confinement status: Secondary | ICD-10-CM | POA: Diagnosis not present

## 2022-12-07 DIAGNOSIS — K219 Gastro-esophageal reflux disease without esophagitis: Secondary | ICD-10-CM | POA: Diagnosis present

## 2022-12-07 DIAGNOSIS — Z8719 Personal history of other diseases of the digestive system: Secondary | ICD-10-CM | POA: Diagnosis not present

## 2022-12-07 DIAGNOSIS — I4891 Unspecified atrial fibrillation: Secondary | ICD-10-CM | POA: Diagnosis not present

## 2022-12-07 DIAGNOSIS — Z87891 Personal history of nicotine dependence: Secondary | ICD-10-CM | POA: Diagnosis not present

## 2022-12-07 DIAGNOSIS — Z792 Long term (current) use of antibiotics: Secondary | ICD-10-CM

## 2022-12-07 DIAGNOSIS — K311 Adult hypertrophic pyloric stenosis: Secondary | ICD-10-CM | POA: Diagnosis not present

## 2022-12-07 DIAGNOSIS — R339 Retention of urine, unspecified: Secondary | ICD-10-CM | POA: Diagnosis not present

## 2022-12-07 DIAGNOSIS — R1030 Lower abdominal pain, unspecified: Secondary | ICD-10-CM | POA: Diagnosis not present

## 2022-12-07 DIAGNOSIS — Z681 Body mass index (BMI) 19 or less, adult: Secondary | ICD-10-CM | POA: Diagnosis not present

## 2022-12-07 DIAGNOSIS — Z91048 Other nonmedicinal substance allergy status: Secondary | ICD-10-CM

## 2022-12-07 LAB — CBC WITH DIFFERENTIAL/PLATELET
Abs Immature Granulocytes: 0.09 10*3/uL — ABNORMAL HIGH (ref 0.00–0.07)
Basophils Absolute: 0 10*3/uL (ref 0.0–0.1)
Basophils Relative: 0 %
Eosinophils Absolute: 0 10*3/uL (ref 0.0–0.5)
Eosinophils Relative: 0 %
HCT: 37.3 % (ref 36.0–46.0)
Hemoglobin: 12.4 g/dL (ref 12.0–15.0)
Immature Granulocytes: 1 %
Lymphocytes Relative: 6 %
Lymphs Abs: 0.8 10*3/uL (ref 0.7–4.0)
MCH: 28.2 pg (ref 26.0–34.0)
MCHC: 33.2 g/dL (ref 30.0–36.0)
MCV: 84.8 fL (ref 80.0–100.0)
Monocytes Absolute: 0.8 10*3/uL (ref 0.1–1.0)
Monocytes Relative: 6 %
Neutro Abs: 11 10*3/uL — ABNORMAL HIGH (ref 1.7–7.7)
Neutrophils Relative %: 87 %
Platelets: 405 10*3/uL — ABNORMAL HIGH (ref 150–400)
RBC: 4.4 MIL/uL (ref 3.87–5.11)
RDW: 14.5 % (ref 11.5–15.5)
WBC: 12.7 10*3/uL — ABNORMAL HIGH (ref 4.0–10.5)
nRBC: 0 % (ref 0.0–0.2)

## 2022-12-07 LAB — COMPREHENSIVE METABOLIC PANEL
ALT: 14 U/L (ref 0–44)
AST: 18 U/L (ref 15–41)
Albumin: 4.4 g/dL (ref 3.5–5.0)
Alkaline Phosphatase: 74 U/L (ref 38–126)
Anion gap: 23 — ABNORMAL HIGH (ref 5–15)
BUN: 30 mg/dL — ABNORMAL HIGH (ref 8–23)
CO2: 29 mmol/L (ref 22–32)
Calcium: 10.2 mg/dL (ref 8.9–10.3)
Chloride: 89 mmol/L — ABNORMAL LOW (ref 98–111)
Creatinine, Ser: 0.8 mg/dL (ref 0.44–1.00)
GFR, Estimated: >60 mL/min (ref >60)
Glucose, Bld: 147 mg/dL — ABNORMAL HIGH (ref 70–99)
Potassium: 3.3 mmol/L — ABNORMAL LOW (ref 3.5–5.1)
Sodium: 141 mmol/L (ref 135–145)
Total Bilirubin: 1.6 mg/dL — ABNORMAL HIGH (ref 0.3–1.2)
Total Protein: 7.6 g/dL (ref 6.5–8.1)

## 2022-12-07 LAB — URINALYSIS, ROUTINE W REFLEX MICROSCOPIC
Bacteria, UA: NONE SEEN
Bilirubin Urine: NEGATIVE
Glucose, UA: NEGATIVE mg/dL
Ketones, ur: 80 mg/dL — AB
Nitrite: NEGATIVE
Protein, ur: >=300 mg/dL — AB
RBC / HPF: >50 RBC/hpf (ref 0–5)
Specific Gravity, Urine: 1.02 (ref 1.005–1.030)
WBC, UA: >50 WBC/hpf (ref 0–5)
pH: 6 (ref 5.0–8.0)

## 2022-12-07 LAB — TYPE AND SCREEN
ABO/RH(D): O POS
Antibody Screen: NEGATIVE

## 2022-12-07 LAB — PROTIME-INR
INR: 1.1 (ref 0.8–1.2)
Prothrombin Time: 13.9 s (ref 11.4–15.2)

## 2022-12-07 LAB — LIPASE, BLOOD: Lipase: 37 U/L (ref 11–51)

## 2022-12-07 MED ORDER — IOHEXOL 300 MG/ML  SOLN
100.0000 mL | Freq: Once | INTRAMUSCULAR | Status: AC | PRN
  Administered 2022-12-07: 100 mL via INTRAVENOUS

## 2022-12-07 MED ORDER — LACTATED RINGERS IV BOLUS
1000.0000 mL | Freq: Once | INTRAVENOUS | Status: AC
  Administered 2022-12-07: 1000 mL via INTRAVENOUS

## 2022-12-07 MED ORDER — ONDANSETRON HCL 4 MG/2ML IJ SOLN
4.0000 mg | Freq: Once | INTRAMUSCULAR | Status: AC
  Administered 2022-12-07: 4 mg via INTRAVENOUS
  Filled 2022-12-07: qty 2

## 2022-12-07 MED ORDER — SODIUM CHLORIDE 0.9 % IV SOLN
1.0000 g | Freq: Once | INTRAVENOUS | Status: AC
  Administered 2022-12-07: 1 g via INTRAVENOUS
  Filled 2022-12-07: qty 10

## 2022-12-07 MED ORDER — METOCLOPRAMIDE HCL 5 MG/ML IJ SOLN
5.0000 mg | Freq: Once | INTRAMUSCULAR | Status: AC
  Administered 2022-12-07: 5 mg via INTRAVENOUS
  Filled 2022-12-07: qty 2

## 2022-12-07 MED ORDER — PANTOPRAZOLE SODIUM 40 MG IV SOLR
40.0000 mg | Freq: Once | INTRAVENOUS | Status: AC
  Administered 2022-12-07: 40 mg via INTRAVENOUS
  Filled 2022-12-07: qty 10

## 2022-12-07 NOTE — ED Provider Triage Note (Signed)
Emergency Medicine Provider Triage Evaluation Note  Laura Cook , a 89 y.o. female  was evaluated in triage.  Here with son. Pt complains of lower abdominal pain x 2-3 weeks. Here with coffee ground emesis onset 2 days ago. Patient's son states "her bladder fell down and because of her age they didn't want to fix it." Unknown if on thinners.  Has papers from [facility] ER in [place] from visit this morning, advised to go to [facility] for her cystocele, provided with Keflex 500mg .   Review of Systems  Positive:  Negative:   Physical Exam  BP 136/86 (BP Location: Left Arm)   Pulse (!) 108   Temp 98 F (36.7 C) (Oral)   Resp 17   SpO2 100%  Gen:   Awake, no distress   Resp:  Normal effort  MSK:   Moves extremities without difficulty  Other:  Coffee ground emesis noted on gown and in emesis  Medical Decision Making  Medically screening exam initiated at 4:33 PM.  Appropriate orders placed.  Laura Cook was informed that the remainder of the evaluation will be completed by another provider, this initial triage assessment does not replace that evaluation, and the importance of remaining in the ED until their evaluation is complete. Hgb 11.0 at [facility] this morning. Notes urology reduced prolapse and placed catheter.  EMS reports to [facility] this am with afib on monitor and hypotensive, no hx of afib, normotensive at triage in [facility].     Jeannie Fend, PA-C 12/07/22 1640

## 2022-12-07 NOTE — ED Triage Notes (Signed)
Patient reports nausea, vomiting, and abdominal pain x 1 day. Patient had cytocele with prolapse that was treated 2 days ago. Reports today she was back in the ER because it had come out again and the new onset of nausea and vomiting. Patient was told to come her to see urology.

## 2022-12-07 NOTE — ED Provider Notes (Signed)
[place] EMERGENCY DEPARTMENT AT [facility] Provider Note   CSN: [id number] Arrival date & time: 12/07/22  1606     History Chief Complaint  Patient presents with   Abdominal Pain    HPI Laura Cook is a 89 y.o. female presenting for multiple complaints.  No acute primary drivers of her presentation today.  First, she was diagnosed with a UTI and had a Foley catheter placed for urinary retention this morning at [facility]. She allegedly had pelvic organ prolapse disease treated by the urologist at the external facility.  She was discharged on Keflex.  However today on presentation here, her family states that she has had complete p.o. intolerance for 3 days with nausea or vomiting persistently.  She was unable to take the medications so they come here for further care and management.  States that they were told to follow-up here.   Patient's recorded medical, surgical, social, medication list and allergies were reviewed in the Snapshot window as part of the initial history.   Review of Systems   Review of Systems  Constitutional:  Negative for chills and fever.  HENT:  Negative for ear pain and sore throat.   Eyes:  Negative for pain and visual disturbance.  Respiratory:  Negative for cough and shortness of breath.   Cardiovascular:  Negative for chest pain and palpitations.  Gastrointestinal:  Positive for abdominal pain, nausea and vomiting.  Genitourinary:  Negative for dysuria and hematuria.  Musculoskeletal:  Negative for arthralgias and back pain.  Skin:  Negative for color change and rash.  Neurological:  Negative for seizures and syncope.  All other systems reviewed and are negative.   Physical Exam Updated Vital Signs BP (!) 147/61 (BP Location: Right Arm)   Pulse 84   Temp 99.1 F (37.3 C) (Oral)   Resp 17   SpO2 100%  Physical Exam Vitals and nursing note reviewed.  Constitutional:      General: She is not in acute distress.     Appearance: She is well-developed.  HENT:     Head: Normocephalic and atraumatic.  Eyes:     Conjunctiva/sclera: Conjunctivae normal.  Cardiovascular:     Rate and Rhythm: Normal rate and regular rhythm.     Heart sounds: No murmur heard. Pulmonary:     Effort: Pulmonary effort is normal. No respiratory distress.     Breath sounds: Normal breath sounds.  Abdominal:     General: There is distension.     Palpations: Abdomen is soft.     Tenderness: There is abdominal tenderness. There is no right CVA tenderness or left CVA tenderness.  Musculoskeletal:        General: No swelling or tenderness. Normal range of motion.     Cervical back: Neck supple.  Skin:    General: Skin is warm and dry.  Neurological:     General: No focal deficit present.     Mental Status: She is alert and oriented to person, place, and time. Mental status is at baseline.     Cranial Nerves: No cranial nerve deficit.      ED Course/ Medical Decision Making/ A&P Clinical Course as of 12/07/22 2348  Thu Dec 07, 2022  2119 Consulted Dr. Carolynne Edouard and requested review of CT scan. He recommended GI consultation and they will eval in AM. [CC]  2213 Magod consultation.  [CC]    Clinical Course User Index [CC] Glyn Ade, MD    Procedures .Critical Care  Performed  by: Glyn Ade, MD Authorized by: Glyn Ade, MD   Critical care provider statement:    Critical care time (minutes):  95   Critical care was necessary to treat or prevent imminent or life-threatening deterioration of the following conditions:  Dehydration   Critical care was time spent personally by me on the following activities:  Development of treatment plan with patient or surrogate, discussions with consultants, evaluation of patient's response to treatment, examination of patient, ordering and review of laboratory studies, ordering and review of radiographic studies, ordering and performing treatments and interventions, pulse  oximetry, re-evaluation of patient's condition and review of old charts   Care discussed with: admitting provider      Medications Ordered in ED Medications  ondansetron (ZOFRAN) injection 4 mg (4 mg Intravenous Given 12/07/22 1854)  lactated ringers bolus 1,000 mL (1,000 mLs Intravenous New Bag/Given 12/07/22 1854)  iohexol (OMNIPAQUE) 300 MG/ML solution 100 mL (100 mLs Intravenous Contrast Given 12/07/22 1908)  metoCLOPramide (REGLAN) injection 5 mg (5 mg Intravenous Given 12/07/22 2002)  pantoprazole (PROTONIX) injection 40 mg (40 mg Intravenous Given 12/07/22 2003)  cefTRIAXone (ROCEPHIN) 1 g in sodium chloride 0.9 % 100 mL IVPB (0 g Intravenous Stopped 12/07/22 2312)   Medical Decision Making:   Laura Cook is a 89 y.o. female who presented to the ED today with abdominal pain, detailed above.    Additional history discussed with patient's family/caregivers.  Patient placed on continuous vitals and telemetry monitoring while in ED which was reviewed periodically.  Complete initial physical exam performed, notably the patient  was given distended abdomen and protracted nausea and vomiting not responding to Zofran or Reglan.     Reviewed and confirmed nursing documentation for past medical history, family history, social history.    Initial Assessment:   With the patient's presentation of abdominal pain, most likely diagnosis is nonspecific etiology. Other diagnoses were considered including (but not limited to) gastroenteritis, colitis, small bowel obstruction, appendicitis, cholecystitis, pancreatitis, nephrolithiasis, UTI, pyleonephritis. These are considered less likely due to history of present illness and physical exam findings.   This is most consistent with an acute life/limb threatening illness complicated by underlying chronic conditions.   Initial Plan:  CBC/CMP to evaluate for underlying infectious/metabolic etiology for patient's abdominal pain  Lipase to evaluate for  pancreatitis  EKG to evaluate for cardiac source of pain  CTAB/Pelvis with contrast to evaluate for structural/surgical etiology of patients' severe abdominal pain.  Urinalysis and repeat physical assessment to evaluate for UTI/Pyelonpehritis  Empiric management of symptoms with escalating pain control and antiemetics as needed.   Initial Study Results:   Laboratory  All laboratory results reviewed without evidence of clinically relevant pathology.   Exceptions include: Leukocytosis, bacteriuria, leukocyturia   EKG EKG was reviewed independently. Rate, rhythm, axis, intervals all examined and without medically relevant abnormality. ST segments without concerns for elevations.    Radiology All images reviewed independently. Agree with radiology report at this time.   DG Abd 1 View  Result Date: 12/07/2022 CLINICAL DATA:  Confirm NG tube placement EXAM: ABDOMEN - 1 VIEW COMPARISON:  Same day CT abdomen and pelvis FINDINGS: Enteric tube tip and side port are in the stomach. Remainder unchanged from CT earlier today. IMPRESSION: Enteric tube tip and side-port in the stomach. Electronically Signed   By: Minerva Fester M.D.   On: 12/07/2022 20:42   CT ABDOMEN PELVIS W CONTRAST  Result Date: 12/07/2022 CLINICAL DATA:  89 year old female with lower abdominal pain coffee-ground emesis.  EXAM: CT ABDOMEN AND PELVIS WITH CONTRAST TECHNIQUE: Multidetector CT imaging of the abdomen and pelvis was performed using the standard protocol following bolus administration of intravenous contrast. RADIATION DOSE REDUCTION: This exam was performed according to the departmental dose-optimization program which includes automated exposure control, adjustment of the mA and/or kV according to patient size and/or use of iterative reconstruction technique. CONTRAST:  OMNIPAQUE IOHEXOL 300 MG/ML  SOLN COMPARISON:  CT 09/19/2022 and pelvic ultrasound earlier today FINDINGS: Lower chest: No acute abnormality.  Hepatobiliary: Presumed hemangioma in the posterior right hepatic dome. Cholelithiasis without evidence of acute cholecystitis. Pancreas: Cystic lesion near the uncinate process of the pancreas on series 2/image 28 measuring 2.2 cm. No ductal dilation. No evidence of acute pancreatitis. Spleen: Unremarkable. Adrenals/Urinary Tract: Unremarkable adrenal glands. Heterogenously enhancing mass in the superior pole of the right kidney measures 4.3 x 4.2 cm. No urinary calculi or hydronephrosis. Pelvic floor laxity with the bladder below the level of the pelvic inlet. Foley catheter within the bladder. The walls of the bladder are not clearly delineated. The bladder wall mass seen on same-day pelvic ultrasound is not well demonstrated on CT. Stomach/Bowel: Moderate mixed type hiatal hernia. The stomach is distended with abrupt narrowing at the pylorus duodenal junction. No definite mass noting limitations of paucity of fat and motion. No evidence of obstruction. Moderate colonic stool load. No wall thickening. Colonic diverticulosis without diverticulitis. Vascular/Lymphatic: Aortic atherosclerosis. No enlarged abdominal or pelvic lymph nodes. Reproductive: Hysterectomy. Other: No free intraperitoneal air. Musculoskeletal: Demineralization. Postoperative changes both femurs. Remote left pubic rami fractures. No acute fracture. IMPRESSION: 1. Pelvic floor laxity with the bladder below the level of the pelvic inlet. Bladder wall thickening. The bladder wall mass seen on same-day pelvic ultrasound is not well demonstrated on CT. 2. Heterogenously enhancing mass in the superior pole of the right kidney measures 4.3 x 4.2 cm, concerning for renal cell carcinoma. 3. The stomach is distended with abrupt narrowing at the pylorus duodenal junction. No definite mass noting limitations of paucity of fat and motion. 4. Cystic lesion near the uncinate process of the pancreas measuring 2.2 cm. 5. Cholelithiasis without evidence of  acute cholecystitis. 6. Colonic diverticulosis without diverticulitis. 7. Moderate mixed type hiatal hernia. Aortic Atherosclerosis (ICD10-I70.0). Electronically Signed   By: Minerva Fester M.D.   On: 12/07/2022 20:12     Consults: Case discussed with hospitalist, general surgery Carolynne Edouard), gastroenterology [facility]).   Final Reassessment and Plan:   Ultimately GI will plan for EGD in AM.  Will continue treating her UTI with ceftriaxone if she has not tolerating p.o. intake, general surgery will consult in AM. Admitted to medicine for hydration and p.o. intolerance in the interim.  Disposition:   Based on the above findings, I believe this patient is stable for admission.    Patient/family educated about specific findings on our evaluation and explained exact reasons for admission.  Patient/family educated about clinical situation and time was allowed to answer questions.   Admission team communicated with and agreed with need for admission. Patient admitted. Patient ready to move at this time.     Emergency Department Medication Summary:   Medications  ondansetron [facility]) injection 4 mg (4 mg Intravenous Given 12/07/22 1854)  lactated ringers bolus 1,000 mL (1,000 mLs Intravenous New Bag/Given 12/07/22 1854)  iohexol (OMNIPAQUE) 300 MG/ML solution 100 mL (100 mLs Intravenous Contrast Given 12/07/22 1908)  metoCLOPramide (REGLAN) injection 5 mg (5 mg Intravenous Given 12/07/22 2002)  pantoprazole (PROTONIX) injection 40 mg (40  mg Intravenous Given 12/07/22 2003)  cefTRIAXone (ROCEPHIN) 1 g in sodium chloride 0.9 % 100 mL IVPB (0 g Intravenous Stopped 12/07/22 2312)             Clinical Impression:  1. Nausea and vomiting, unspecified vomiting type   2. Generalized abdominal pain   3. Lower urinary tract infectious disease      Admit   Final Clinical Impression(s) / ED Diagnoses Final diagnoses:  Nausea and vomiting, unspecified vomiting type  Generalized abdominal pain  Lower  urinary tract infectious disease    Rx / DC Orders ED Discharge Orders     None         Glyn Ade, MD 12/07/22 2348

## 2022-12-07 NOTE — H&P (Signed)
History and Physical    Patient: Laura Cook XBM:[id number] DOB: 01/15/1923 DOA: 12/07/2022 DOS: the patient was seen and examined on 12/07/2022 PCP: Kirstie Peri, MD  Patient coming from: Home  Chief Complaint:  Chief Complaint  Patient presents with   Abdominal Pain   HPI: Sweet R Budman is a 89 y.o. female with medical history significant of essential hypertension, history of hyperlipidemia and GERD who presented to [facility] with intractable nausea vomiting and abdominal pain.  Was seen in the ER.  Initial treatment did not resolve the issue.  Patient was told to come to [facility] for treatment.  Not sure why.  Patient however has continued to have significant nausea vomiting abdominal distention.  He was evaluated here in the ER and found to have markedly distended stomach and small bowel..  Suspicion is for gastric outlet obstruction of some sort or early duodenal obstruction.  Surgery consulted and GI also consulted.  Patient being admitted for further evaluation and treatment.  She denied any hematemesis, no melena no bright red blood per rectum.  Review of Systems: As mentioned in the history of present illness. All other systems reviewed and are negative. Past Medical History:  Diagnosis Date   Hypertension    Past Surgical History:  Procedure Laterality Date   ABDOMINAL HYSTERECTOMY     DILATION AND CURETTAGE OF UTERUS     HIP FRACTURE SURGERY Left    INTRAMEDULLARY (IM) NAIL INTERTROCHANTERIC Right 11/15/2020   Procedure: INTRAMEDULLARY (IM) NAIL INTERTROCHANTRIC;  Surgeon: Oliver Barre, MD;  Location: [facility];  Service: Orthopedics;  Laterality: Right;   YAG LASER APPLICATION Left 11/10/2013   Procedure: YAG LASER APPLICATION;  Surgeon: Susa Simmonds, MD;  Location: [facility];  Service: Ophthalmology;  Laterality: Left;   Social History:  reports that she has never smoked. She has never been exposed to tobacco smoke. She has never used smokeless  tobacco. She reports that she does not drink alcohol and does not use drugs.  No Known Allergies  History reviewed. No pertinent family history.  Prior to Admission medications   Medication Sig Start Date End Date Taking? Authorizing Provider  ciprofloxacin (CIPRO) 250 MG tablet Take 250 mg by mouth 2 (two) times daily. 10/31/22  Yes [provider]  famotidine (PEPCID) 40 MG tablet Take 40 mg by mouth daily. 11/27/22  Yes [provider]  trimethoprim (TRIMPEX) 100 MG tablet Take 100 mg by mouth daily. 11/27/22  Yes [provider]  acetaminophen (TYLENOL) 325 MG tablet Take 2 tablets (650 mg total) by mouth every 6 (six) hours as needed for mild pain (or Fever >/= 101). 11/18/20   Emokpae, Courage, MD  amLODipine (NORVASC) 5 MG tablet Take 5 mg by mouth daily. 11/11/20   [provider]  aspirin EC 325 MG tablet Take 1 tablet (325 mg total) by mouth daily with breakfast. 11/18/20   Shon Hale, MD  baclofen (LIORESAL) 10 MG tablet Take 10 mg by mouth 2 (two) times daily. Patient not taking: Reported on 10/18/2021 06/28/21   [provider]  cephALEXin (KEFLEX) 500 MG capsule Take 1 capsule (500 mg total) by mouth 2 (two) times daily. 12/05/22   Charlynne Pander, MD  Cholecalciferol (VITAMIN D3 PO) Take by mouth.    [provider]  diclofenac Sodium (VOLTAREN) 1 % GEL Apply 2 g topically 2 (two) times daily.    [provider]  hydrochlorothiazide (HYDRODIURIL) 50 MG tablet Take 50 mg by mouth every  morning. 05/06/21   [provider]  ketotifen (ZADITOR) 0.025 % ophthalmic solution Place 1 drop into both eyes 2 (two) times daily.    [provider]  meclizine (ANTIVERT) 25 MG tablet Take 25 mg by mouth every 8 (eight) hours as needed. 07/01/20   [provider]  meloxicam (MOBIC) 15 MG tablet Take 15 mg by mouth daily. 10/14/21   [provider]  metoprolol succinate (TOPROL XL) 50 MG 24 hr tablet  Take 1 tablet (50 mg total) by mouth daily. Take with or immediately following a meal. 11/18/20 11/18/21  Shon Hale, MD  Multiple Vitamins-Minerals (PRESERVISION AREDS 2 PO) Take by mouth.    [provider]  omeprazole (PRILOSEC) 20 MG capsule Take 20 mg by mouth daily.    [provider]  polyethylene glycol (MIRALAX) 17 g packet Take 17 g by mouth daily. 11/18/20   Shon Hale, MD  PREMARIN vaginal cream Place 1 g vaginally daily. 09/15/21   [provider]  rosuvastatin (CRESTOR) 10 MG tablet Take 10 mg by mouth at bedtime. 05/14/21   [provider]  sulfamethoxazole-trimethoprim (BACTRIM DS) 800-160 MG tablet Take 1 tablet by mouth 2 (two) times daily. 10/17/21   [provider]  traMADol (ULTRAM) 50 MG tablet Take 1 tablet (50 mg total) by mouth every 6 (six) hours as needed. 12/05/22   Charlynne Pander, MD    Physical Exam: Vitals:   12/07/22 1800 12/07/22 1830 12/07/22 2121 12/07/22 2311  BP: 139/62 (!) 148/69  (!) 147/61  Pulse: 95 97  84  Resp: 16 20  17   Temp:   99.1 F (37.3 C)   TempSrc:   Oral   SpO2: 95% 97%  100%   Constitutional: Acutely ill looking, NAD, calm, comfortable Eyes: PERRL, lids and conjunctivae normal ENMT: Mucous membranes are dry. Posterior pharynx clear of any exudate or lesions.Normal dentition.  Neck: normal, supple, no masses, no thyromegaly Respiratory: clear to auscultation bilaterally, no wheezing, no crackles. Normal respiratory effort. No accessory muscle use.  Cardiovascular: Sinus tachycardia, no murmurs / rubs / gallops. No extremity edema. 2+ pedal pulses. No carotid bruits.  Abdomen: no tenderness, no masses palpated. No hepatosplenomegaly. Bowel sounds positive.  Musculoskeletal: Good range of motion, no joint swelling or tenderness, Skin: no rashes, lesions, ulcers. No induration Neurologic: CN 2-12 grossly intact. Sensation intact, DTR normal. Strength 5/5 in all 4.  Psychiatric:  Normal judgment and insight. Alert and oriented x 3. Normal mood  Data Reviewed:  Blood pressure 140/69, potassium 3.3 chloride 89, glucose 147 BUN 30 gap 23, white count 12.7 and platelets 405 urinalysis showed large leukocytes WBC more than 50 but no bacteria CT abdomen pelvis shows pelvic floor laxity with the bladder below the level of the pelvic inlet bladder wall thickening of the bladder wall mass seen.  Heterogeneously enhancing mass in the superior pole of the right kidney measuring 4.3 x 4.2, distended stomach with abrupt narrowing at the pylorus duodenal junction no definite mass.  Cholelithiasis  Assessment and Plan:  #1 intractable nausea with vomiting: Suspected due to pyloric obstruction or possibly duodenal obstruction.  Patient will be admitted.  NG tube in place.  Surgery consulted and GI consulted for possible EGD.  Aggressive hydration and pain control.  Also nausea control.  #2 pelvic floor laxity: Patient already seen by urologist prior to coming in.  Defer to urology as outpatient for further evaluation and treatment.  #3 essential hypertension: Blood pressure at this point  is stable.  We may use IV with blood pressure if needed for blood pressure control.  #4 leukocytosis: Probably secondary to inflammation.  Continue to monitor  #5 GERD: PPIs IV.  #6 hypokalemia: Continue to replete  #7 hyperlipidemia: Will resume home regimen when stable.    Advance Care Planning:   Code Status: Full Code   Consults: Dr. Carolynne Edouard general surgery, Dr. Ewing Schlein GI  Family Communication: No family at bedside  Severity of Illness: The appropriate patient status for this patient is INPATIENT. Inpatient status is judged to be reasonable and necessary in order to provide the required intensity of service to ensure the patient's safety. The patient's presenting symptoms, physical exam findings, and initial radiographic and laboratory data in the context of their chronic comorbidities is felt  to place them at high risk for further clinical deterioration. Furthermore, it is not anticipated that the patient will be medically stable for discharge from the hospital within 2 midnights of admission.   * I certify that at the point of admission it is my clinical judgment that the patient will require inpatient hospital care spanning beyond 2 midnights from the point of admission due to high intensity of service, high risk for further deterioration and high frequency of surveillance required.*  AuthorLonia Blood, MD 12/07/2022 11:19 PM  For on call review [email or web address].

## 2022-12-08 ENCOUNTER — Encounter (HOSPITAL_COMMUNITY): Payer: Self-pay | Admitting: Internal Medicine

## 2022-12-08 ENCOUNTER — Inpatient Hospital Stay (HOSPITAL_COMMUNITY): Payer: HMO

## 2022-12-08 DIAGNOSIS — Z66 Do not resuscitate: Secondary | ICD-10-CM | POA: Diagnosis not present

## 2022-12-08 DIAGNOSIS — K311 Adult hypertrophic pyloric stenosis: Principal | ICD-10-CM

## 2022-12-08 DIAGNOSIS — Z515 Encounter for palliative care: Secondary | ICD-10-CM

## 2022-12-08 DIAGNOSIS — Z7189 Other specified counseling: Secondary | ICD-10-CM

## 2022-12-08 DIAGNOSIS — R112 Nausea with vomiting, unspecified: Secondary | ICD-10-CM | POA: Diagnosis not present

## 2022-12-08 LAB — COMPREHENSIVE METABOLIC PANEL
ALT: 12 U/L (ref 0–44)
AST: 15 U/L (ref 15–41)
Albumin: 3.7 g/dL (ref 3.5–5.0)
Alkaline Phosphatase: 61 U/L (ref 38–126)
Anion gap: 15 (ref 5–15)
BUN: 30 mg/dL — ABNORMAL HIGH (ref 8–23)
CO2: 31 mmol/L (ref 22–32)
Calcium: 8.9 mg/dL (ref 8.9–10.3)
Chloride: 88 mmol/L — ABNORMAL LOW (ref 98–111)
Creatinine, Ser: 0.78 mg/dL (ref 0.44–1.00)
GFR, Estimated: >60 mL/min (ref >60)
Glucose, Bld: 176 mg/dL — ABNORMAL HIGH (ref 70–99)
Potassium: 2.7 mmol/L — CL (ref 3.5–5.1)
Sodium: 134 mmol/L — ABNORMAL LOW (ref 135–145)
Total Bilirubin: 0.9 mg/dL (ref 0.3–1.2)
Total Protein: 6.2 g/dL — ABNORMAL LOW (ref 6.5–8.1)

## 2022-12-08 LAB — CBC
HCT: 32.4 % — ABNORMAL LOW (ref 36.0–46.0)
Hemoglobin: 10.4 g/dL — ABNORMAL LOW (ref 12.0–15.0)
MCH: 28.2 pg (ref 26.0–34.0)
MCHC: 32.1 g/dL (ref 30.0–36.0)
MCV: 87.8 fL (ref 80.0–100.0)
Platelets: 338 10*3/uL (ref 150–400)
RBC: 3.69 MIL/uL — ABNORMAL LOW (ref 3.87–5.11)
RDW: 14.8 % (ref 11.5–15.5)
WBC: 10.5 10*3/uL (ref 4.0–10.5)
nRBC: 0 % (ref 0.0–0.2)

## 2022-12-08 MED ORDER — PANTOPRAZOLE SODIUM 40 MG IV SOLR
40.0000 mg | Freq: Two times a day (BID) | INTRAVENOUS | Status: DC
  Administered 2022-12-08 – 2022-12-14 (×13): 40 mg via INTRAVENOUS
  Filled 2022-12-08 (×15): qty 10

## 2022-12-08 MED ORDER — ONDANSETRON HCL 4 MG/2ML IJ SOLN: 4.0000 mg | Freq: Four times a day (QID) | INTRAMUSCULAR | Status: DC | PRN

## 2022-12-08 MED ORDER — KCL-LACTATED RINGERS-D5W 20 MEQ/L IV SOLN
INTRAVENOUS | Status: AC
  Filled 2022-12-08 (×2): qty 1000

## 2022-12-08 MED ORDER — SODIUM CHLORIDE 0.9 % IV SOLN
1.0000 g | INTRAVENOUS | Status: DC
  Administered 2022-12-08 – 2022-12-11 (×4): 1 g via INTRAVENOUS
  Filled 2022-12-08 (×4): qty 10

## 2022-12-08 MED ORDER — ONDANSETRON HCL 4 MG PO TABS: 4.0000 mg | ORAL_TABLET | Freq: Four times a day (QID) | ORAL | Status: DC | PRN

## 2022-12-08 MED ORDER — CHLORHEXIDINE GLUCONATE CLOTH 2 % EX PADS
6.0000 | MEDICATED_PAD | Freq: Every day | CUTANEOUS | Status: DC
  Administered 2022-12-08 – 2022-12-14 (×7): 6 via TOPICAL

## 2022-12-08 MED ORDER — POTASSIUM CHLORIDE 10 MEQ/100ML IV SOLN
10.0000 meq | INTRAVENOUS | Status: AC
  Administered 2022-12-08 (×6): 10 meq via INTRAVENOUS
  Filled 2022-12-08 (×6): qty 100

## 2022-12-08 NOTE — Consult Note (Signed)
[facility] Gastroenterology Consult  Referring Provider: No ref. provider found Primary Care Physician:  Kirstie Peri, MD Primary Gastroenterologist: Gentry Fitz  Reason for Consultation: Nausea and vomiting, abdominal pain  SUBJECTIVE:   HPI: Laura Cook is a 89 y.o. female with past medical history significant for hypertension.  Presented to hospital with, nausea and vomiting.  She noted that she has been experiencing nausea and vomiting intermittently over the past 2 weeks. Emesis has appeared dark in color at times. She has epigastric abdominal discomfort.  She has intermittent shortness of breath.  No chest pain.  Noted that she has had some weight loss, unable to quantify.  No prior EGD or colonoscopy.  No family history colon cancer.  Labs showed sodium 134, potassium 2.7, BUN/creatinine 30/0.70, AST/ALT 15/12, ALP 61, total bilirubin 0.9, WBC 10.5, platelet 338, UA showed large leukocyte.  CT scan of abdomen pelvis 12/07/2022 showed cholelithiasis, moderate hiatal hernia, stomach distended with abrupt narrowing at pylorus/duodenal junction, diverticulosis.  General surgery has evaluated patient and determined gastric outlet obstruction to be secondary to hiatal hernia with pylorus in chest.  Past Medical History:  Diagnosis Date   Hypertension    Past Surgical History:  Procedure Laterality Date   ABDOMINAL HYSTERECTOMY     DILATION AND CURETTAGE OF UTERUS     HIP FRACTURE SURGERY Left    INTRAMEDULLARY (IM) NAIL INTERTROCHANTERIC Right 11/15/2020   Procedure: INTRAMEDULLARY (IM) NAIL INTERTROCHANTRIC;  Surgeon: Oliver Barre, MD;  Location: [facility];  Service: Orthopedics;  Laterality: Right;   YAG LASER APPLICATION Left 11/10/2013   Procedure: YAG LASER APPLICATION;  Surgeon: Susa Simmonds, MD;  Location: [facility];  Service: Ophthalmology;  Laterality: Left;   Prior to Admission medications   Medication Sig Start Date End Date Taking? Authorizing Provider  ciprofloxacin  (CIPRO) 250 MG tablet Take 250 mg by mouth 2 (two) times daily. 10/31/22  Yes [provider]  famotidine (PEPCID) 40 MG tablet Take 40 mg by mouth daily. 11/27/22  Yes [provider]  trimethoprim (TRIMPEX) 100 MG tablet Take 100 mg by mouth daily. 11/27/22  Yes [provider]  acetaminophen (TYLENOL) 325 MG tablet Take 2 tablets (650 mg total) by mouth every 6 (six) hours as needed for mild pain (or Fever >/= 101). 11/18/20   Emokpae, Courage, MD  amLODipine (NORVASC) 5 MG tablet Take 5 mg by mouth daily. 11/11/20   [provider]  aspirin EC 325 MG tablet Take 1 tablet (325 mg total) by mouth daily with breakfast. 11/18/20   Shon Hale, MD  baclofen (LIORESAL) 10 MG tablet Take 10 mg by mouth 2 (two) times daily. Patient not taking: Reported on 10/18/2021 06/28/21   [provider]  cephALEXin (KEFLEX) 500 MG capsule Take 1 capsule (500 mg total) by mouth 2 (two) times daily. 12/05/22   Charlynne Pander, MD  Cholecalciferol (VITAMIN D3 PO) Take by mouth.    [provider]  diclofenac Sodium (VOLTAREN) 1 % GEL Apply 2 g topically 2 (two) times daily.    [provider]  hydrochlorothiazide (HYDRODIURIL) 50 MG tablet Take 50 mg by mouth every morning. 05/06/21   [provider]  ketotifen (ZADITOR) 0.025 % ophthalmic solution Place 1 drop into both eyes 2 (two) times daily.    [provider]  meclizine (ANTIVERT) 25 MG tablet Take 25 mg by mouth every 8 (eight) hours as needed. 07/01/20   [provider]  meloxicam (MOBIC) 15 MG tablet Take 15 mg  by mouth daily. 10/14/21   [provider]  metoprolol succinate (TOPROL XL) 50 MG 24 hr tablet Take 1 tablet (50 mg total) by mouth daily. Take with or immediately following a meal. 11/18/20 11/18/21  Shon Hale, MD  Multiple Vitamins-Minerals (PRESERVISION AREDS 2 PO) Take by mouth.    [provider]  omeprazole (PRILOSEC) 20 MG capsule Take  20 mg by mouth daily.    [provider]  polyethylene glycol (MIRALAX) 17 g packet Take 17 g by mouth daily. 11/18/20   Shon Hale, MD  PREMARIN vaginal cream Place 1 g vaginally daily. 09/15/21   [provider]  rosuvastatin (CRESTOR) 10 MG tablet Take 10 mg by mouth at bedtime. 05/14/21   [provider]  sulfamethoxazole-trimethoprim (BACTRIM DS) 800-160 MG tablet Take 1 tablet by mouth 2 (two) times daily. 10/17/21   [provider]  traMADol (ULTRAM) 50 MG tablet Take 1 tablet (50 mg total) by mouth every 6 (six) hours as needed. 12/05/22   Charlynne Pander, MD   Current Facility-Administered Medications  Medication Dose Route Frequency Provider Last Rate Last Admin   dextrose 5% in lactated ringers with KCl 20 mEq/L infusion   Intravenous Continuous Glade Lloyd, MD 75 mL/hr at 12/08/22 0742 Rate Change at 12/08/22 0742   ondansetron (ZOFRAN) tablet 4 mg  4 mg Oral Q6H PRN Rometta Emery, MD       Or   ondansetron (ZOFRAN) injection 4 mg  4 mg Intravenous Q6H PRN Rometta Emery, MD       pantoprazole (PROTONIX) injection 40 mg  40 mg Intravenous Q12H Alekh, Kshitiz, MD   40 mg at 12/08/22 0949   potassium chloride 10 mEq in 100 mL IVPB  10 mEq Intravenous Q1 Hr x 6 Alekh, Kshitiz, MD 100 mL/hr at 12/08/22 1052 10 mEq at 12/08/22 1052   Allergies as of 12/07/2022   (No Known Allergies)   History reviewed. No pertinent family history. Social History   Socioeconomic History   Marital status: Widowed    Spouse name: Not on file   Number of children: Not on file   Years of education: Not on file   Highest education level: Not on file  Occupational History   Not on file  Tobacco Use   Smoking status: Never    Passive exposure: Never   Smokeless tobacco: Never  Vaping Use   Vaping status: Not on file  Substance and Sexual Activity   Alcohol use: Never   Drug use: Never   Sexual activity: Not Currently  Other Topics Concern    Not on file  Social History Narrative   Not on file   Social Determinants of Health   Financial Resource Strain: Low Risk  (10/27/2021)   Received from [facility], [facility]   Overall Financial Resource Strain (CARDIA)    Difficulty of Paying Living Expenses: Not hard at all  Food Insecurity: No Food Insecurity (10/27/2021)   [facility], [facility]   Hunger Vital Sign    Worried About Running Out of Food in the Last Year: Never true    Ran Out of Food in the Last Year: Never true  Transportation Needs: No Transportation Needs (10/27/2021)   Received from [facility], [facility]   [facility] - Transportation    Lack of Transportation (Medical): No    Lack of Transportation (Non-Medical): No  Physical Activity: Not on file  Stress: Not  on file  Social Connections: Not on file  Intimate Partner Violence: Not on file   Review of Systems:  Review of Systems  Respiratory:  Positive for shortness of breath.   Cardiovascular:  Negative for chest pain.  Gastrointestinal:  Positive for abdominal pain, nausea and vomiting. Negative for blood in stool.    OBJECTIVE:   Temp:  [98 F (36.7 C)-99.1 F (37.3 C)] 98.4 F (36.9 C) (10/18 0635) Pulse Rate:  [77-108] 77 (10/18 0635) Resp:  [16-20] 17 (10/18 0635) BP: (130-161)/(61-86) 130/84 (10/18 0635) SpO2:  [95 %-100 %] 99 % (10/18 4010)   Physical Exam Constitutional:      General: She is not in acute distress.    Appearance: She is underweight. She is not ill-appearing, toxic-appearing or diaphoretic.  HENT:     Nose:     Comments: NG tube in place with bilious contents.  Cardiovascular:     Rate and Rhythm: Normal rate and regular rhythm.  Pulmonary:     Effort: No respiratory distress.     Breath sounds: Normal breath sounds.  Abdominal:     General: Bowel sounds are normal. There is no distension.     Palpations: Abdomen is soft.     Tenderness: There is abdominal tenderness. There is  no guarding.  Musculoskeletal:     Right lower leg: No edema.     Left lower leg: No edema.  Skin:    General: Skin is warm and dry.     Coloration: Skin is pale.  Neurological:     Mental Status: She is alert.     Labs: Recent Labs    12/07/22 1707 12/08/22 0527  WBC 12.7* 10.5  HGB 12.4 10.4*  HCT 37.3 32.4*  PLT 405* 338   BMET Recent Labs    12/07/22 1707 12/08/22 0527  NA 141 134*  K 3.3* 2.7*  CL 89* 88*  CO2 29 31  GLUCOSE 147* 176*  BUN 30* 30*  CREATININE 0.80 0.78  CALCIUM 10.2 8.9   LFT Recent Labs    12/08/22 0527  PROT 6.2*  ALBUMIN 3.7  AST 15  ALT 12  ALKPHOS 61  BILITOT 0.9   PT/INR Recent Labs    12/07/22 1707  LABPROT 13.9  INR 1.1    Diagnostic imaging: DG Abd 1 View  Result Date: 12/08/2022 CLINICAL DATA:  Nasogastric tube placement EXAM: ABDOMEN - 1 VIEW COMPARISON:  Yesterday FINDINGS: Enteric tube with tip and side port at the stomach. Hazy density at the left base primarily from underpenetration based on prior CT. No convincing interval opacification/aspiration. No cardiac enlargement when accounting for rotation and hiatal hernia. IMPRESSION: 1. Located enteric tube with tip and side port at the stomach. 2. Large hiatal hernia. Electronically Signed   By: Tiburcio Pea M.D.   On: 12/08/2022 06:16   DG Abd 1 View  Result Date: 12/07/2022 CLINICAL DATA:  Confirm NG tube placement EXAM: ABDOMEN - 1 VIEW COMPARISON:  Same day CT abdomen and pelvis FINDINGS: Enteric tube tip and side port are in the stomach. Remainder unchanged from CT earlier today. IMPRESSION: Enteric tube tip and side-port in the stomach. Electronically Signed   By: Minerva Fester M.D.   On: 12/07/2022 20:42   CT ABDOMEN PELVIS W CONTRAST  Result Date: 12/07/2022 CLINICAL DATA:  89 year old female with lower abdominal pain coffee-ground emesis. EXAM: CT ABDOMEN AND PELVIS WITH CONTRAST TECHNIQUE: Multidetector CT imaging of the abdomen and pelvis was  performed using the  standard protocol following bolus administration of intravenous contrast. RADIATION DOSE REDUCTION: This exam was performed according to the departmental dose-optimization program which includes automated exposure control, adjustment of the mA and/or kV according to patient size and/or use of iterative reconstruction technique. CONTRAST:  OMNIPAQUE IOHEXOL 300 MG/ML  SOLN COMPARISON:  CT 09/19/2022 and pelvic ultrasound earlier today FINDINGS: Lower chest: No acute abnormality. Hepatobiliary: Presumed hemangioma in the posterior right hepatic dome. Cholelithiasis without evidence of acute cholecystitis. Pancreas: Cystic lesion near the uncinate process of the pancreas on series 2/image 28 measuring 2.2 cm. No ductal dilation. No evidence of acute pancreatitis. Spleen: Unremarkable. Adrenals/Urinary Tract: Unremarkable adrenal glands. Heterogenously enhancing mass in the superior pole of the right kidney measures 4.3 x 4.2 cm. No urinary calculi or hydronephrosis. Pelvic floor laxity with the bladder below the level of the pelvic inlet. Foley catheter within the bladder. The walls of the bladder are not clearly delineated. The bladder wall mass seen on same-day pelvic ultrasound is not well demonstrated on CT. Stomach/Bowel: Moderate mixed type hiatal hernia. The stomach is distended with abrupt narrowing at the pylorus duodenal junction. No definite mass noting limitations of paucity of fat and motion. No evidence of obstruction. Moderate colonic stool load. No wall thickening. Colonic diverticulosis without diverticulitis. Vascular/Lymphatic: Aortic atherosclerosis. No enlarged abdominal or pelvic lymph nodes. Reproductive: Hysterectomy. Other: No free intraperitoneal air. Musculoskeletal: Demineralization. Postoperative changes both femurs. Remote left pubic rami fractures. No acute fracture. IMPRESSION: 1. Pelvic floor laxity with the bladder below the level of the pelvic inlet. Bladder  wall thickening. The bladder wall mass seen on same-day pelvic ultrasound is not well demonstrated on CT. 2. Heterogenously enhancing mass in the superior pole of the right kidney measures 4.3 x 4.2 cm, concerning for renal cell carcinoma. 3. The stomach is distended with abrupt narrowing at the pylorus duodenal junction. No definite mass noting limitations of paucity of fat and motion. 4. Cystic lesion near the uncinate process of the pancreas measuring 2.2 cm. 5. Cholelithiasis without evidence of acute cholecystitis. 6. Colonic diverticulosis without diverticulitis. 7. Moderate mixed type hiatal hernia. Aortic Atherosclerosis (ICD10-I70.0). Electronically Signed   By: Minerva Fester M.D.   On: 12/07/2022 20:12    IMPRESSION: Gastric outlet obstruction secondary to hiatal hernia  Intractable nausea and vomiting secondary to above  Hypertension Hypokalemia Normocytic anemia  PLAN: -No indication for endoscopic evaluation at this time -Your General Surgery management -[facility] will be available as needed   LOS: 1 day   Liliane Shi, [facility] Gastroenterology

## 2022-12-08 NOTE — ED Notes (Signed)
ED TO INPATIENT HANDOFF REPORT  ED Nurse Name and Phone #: Linus Orn Name/Age/Gender Laura Cook 89 y.o. female Room/Bed: RESB/RESB  Code Status   Code Status: Full Code  Home/SNF/Other Home Patient oriented to: self, place, time, and situation Is this baseline? Yes   Triage Complete: Triage complete  Chief Complaint Intractable nausea and vomiting [R11.2]  Triage Note Patient reports nausea, vomiting, and abdominal pain x 1 day. Patient had cytocele with prolapse that was treated 2 days ago. Reports today she was back in the ER because it had come out again and the new onset of nausea and vomiting. Patient was told to come her to see urology.    Allergies No Known Allergies  Level of Care/Admitting Diagnosis ED Disposition     ED Disposition  Admit   Condition  --   Comment  [facility]: [facility] [[id number]]  Level of Care: Med-Surg [16]  May admit patient to [facility] or [facility] if equivalent level of care is available:: Yes  Covid Evaluation: Asymptomatic - no recent exposure (last 10 days) testing not required  Diagnosis: Intractable nausea and vomiting [720114]  Admitting Physician: Rometta Emery [[id number]]  Attending Physician: Rometta Emery [[id number]]  Certification:: I certify this patient will need inpatient services for at least 2 midnights  Expected Medical Readiness: 12/10/2022          B Medical/Surgery History Past Medical History:  Diagnosis Date   Hypertension    Past Surgical History:  Procedure Laterality Date   ABDOMINAL HYSTERECTOMY     DILATION AND CURETTAGE OF UTERUS     HIP FRACTURE SURGERY Left    INTRAMEDULLARY (IM) NAIL INTERTROCHANTERIC Right 11/15/2020   Procedure: INTRAMEDULLARY (IM) NAIL INTERTROCHANTRIC;  Surgeon: Oliver Barre, MD;  Location: [facility];  Service: Orthopedics;  Laterality: Right;   YAG LASER APPLICATION Left 11/10/2013   Procedure: YAG LASER APPLICATION;  Surgeon: Susa Simmonds, MD;  Location: [facility];  Service: Ophthalmology;  Laterality: Left;     A IV Location/Drains/Wounds Patient Lines/Drains/Airways Status     Active Line/Drains/Airways     Name Placement date Placement time Site Days   Peripheral IV 12/07/22 20 G Anterior;Left;Proximal Forearm 12/07/22  1704  Forearm  1   NG/OG Vented/Dual Lumen 14 Fr. Left nare Marking at nare/corner of mouth 55 cm 12/07/22  2000  Left nare  1            Intake/Output Last 24 hours No intake or output data in the 24 hours ending 12/08/22 0110  Labs/Imaging Results for orders placed or performed during the hospital encounter of 12/07/22 (from the past 48 hour(s))  CBC with Differential     Status: Abnormal   Collection Time: 12/07/22  5:07 PM  Result Value Ref Range   WBC 12.7 (H) 4.0 - 10.5 K/uL   RBC 4.40 3.87 - 5.11 MIL/uL   Hemoglobin 12.4 12.0 - 15.0 g/dL   HCT 78.2 95.6 - 21.3 %   MCV 84.8 80.0 - 100.0 fL   MCH 28.2 26.0 - 34.0 pg   MCHC 33.2 30.0 - 36.0 g/dL   RDW 08.6 57.8 - 46.9 %   Platelets 405 (H) 150 - 400 K/uL   nRBC 0.0 0.0 - 0.2 %   Neutrophils Relative % 87 %   Neutro Abs 11.0 (H) 1.7 - 7.7 K/uL   Lymphocytes Relative 6 %   Lymphs Abs 0.8 0.7 - 4.0 K/uL  Monocytes Relative 6 %   Monocytes Absolute 0.8 0.1 - 1.0 K/uL   Eosinophils Relative 0 %   Eosinophils Absolute 0.0 0.0 - 0.5 K/uL   Basophils Relative 0 %   Basophils Absolute 0.0 0.0 - 0.1 K/uL   Immature Granulocytes 1 %   Abs Immature Granulocytes 0.09 (H) 0.00 - 0.07 K/uL    Comment: Performed at [facility], [address] [place], [place] [address]  Comprehensive metabolic panel     Status: Abnormal   Collection Time: 12/07/22  5:07 PM  Result Value Ref Range   Sodium 141 135 - 145 mmol/L   Potassium 3.3 (L) 3.5 - 5.1 mmol/L   Chloride 89 (L) 98 - 111 mmol/L   CO2 29 22 - 32 mmol/L   Glucose, Bld 147 (H) 70 - 99 mg/dL    Comment: Glucose reference range applies only to samples taken after  fasting for at least 8 hours.   BUN 30 (H) 8 - 23 mg/dL   Creatinine, Ser 6.04 0.44 - 1.00 mg/dL   Calcium 54.0 8.9 - 98.1 mg/dL   Total Protein 7.6 6.5 - 8.1 g/dL   Albumin 4.4 3.5 - 5.0 g/dL   AST 18 15 - 41 U/L   ALT 14 0 - 44 U/L   Alkaline Phosphatase 74 38 - 126 U/L   Total Bilirubin 1.6 (H) 0.3 - 1.2 mg/dL   GFR, Estimated >19 >14 mL/min    Comment: (NOTE) Calculated using the CKD-EPI Creatinine Equation (2021)    Anion gap 23 (H) 5 - 15    Comment: ELECTROLYTES REPEATED TO VERIFY Performed at [facility], [address] [place], [place] [address]   Lipase, blood     Status: None   Collection Time: 12/07/22  5:07 PM  Result Value Ref Range   Lipase 37 11 - 51 U/L    Comment: Performed at [facility], [address] [place], [place] [address]  Type and screen [facility]     Status: None   Collection Time: 12/07/22  5:07 PM  Result Value Ref Range   ABO/RH(D) O POS    Antibody Screen NEG    Sample Expiration      12/10/2022,2359 Performed at [facility], [address] [place], [place] [address]   Protime-INR     Status: None   Collection Time: 12/07/22  5:07 PM  Result Value Ref Range   Prothrombin Time 13.9 11.4 - 15.2 seconds   INR 1.1 0.8 - 1.2    Comment: (NOTE) INR goal varies based on device and disease states. Performed at [facility], [address] [place], [place] [address]   Urinalysis, Routine w reflex microscopic -Urine, Clean Catch     Status: Abnormal   Collection Time: 12/07/22  5:57 PM  Result Value Ref Range   Color, Urine YELLOW YELLOW   APPearance HAZY (A) CLEAR   Specific Gravity, Urine 1.020 1.005 - 1.030   pH 6.0 5.0 - 8.0   Glucose, UA NEGATIVE NEGATIVE mg/dL   Hgb urine dipstick SMALL (A) NEGATIVE   Bilirubin Urine NEGATIVE NEGATIVE   Ketones, ur 80 (A) NEGATIVE mg/dL   Protein, ur >=952 (A) NEGATIVE mg/dL   Nitrite NEGATIVE  NEGATIVE   Leukocytes,Ua LARGE (A) NEGATIVE   RBC / HPF >50 0 - 5 RBC/hpf   WBC, UA >50 0 - 5 WBC/hpf   Bacteria, UA NONE SEEN NONE SEEN  Squamous Epithelial / HPF 0-5 0 - 5 /HPF   Mucus PRESENT     Comment: Performed at [facility], [address] [place], [place] [address]   DG Abd 1 View  Result Date: 12/07/2022 CLINICAL DATA:  Confirm NG tube placement EXAM: ABDOMEN - 1 VIEW COMPARISON:  Same day CT abdomen and pelvis FINDINGS: Enteric tube tip and side port are in the stomach. Remainder unchanged from CT earlier today. IMPRESSION: Enteric tube tip and side-port in the stomach. Electronically Signed   By: Minerva Fester M.D.   On: 12/07/2022 20:42   CT ABDOMEN PELVIS W CONTRAST  Result Date: 12/07/2022 CLINICAL DATA:  89 year old female with lower abdominal pain coffee-ground emesis. EXAM: CT ABDOMEN AND PELVIS WITH CONTRAST TECHNIQUE: Multidetector CT imaging of the abdomen and pelvis was performed using the standard protocol following bolus administration of intravenous contrast. RADIATION DOSE REDUCTION: This exam was performed according to the departmental dose-optimization program which includes automated exposure control, adjustment of the mA and/or kV according to patient size and/or use of iterative reconstruction technique. CONTRAST:  OMNIPAQUE IOHEXOL 300 MG/ML  SOLN COMPARISON:  CT 09/19/2022 and pelvic ultrasound earlier today FINDINGS: Lower chest: No acute abnormality. Hepatobiliary: Presumed hemangioma in the posterior right hepatic dome. Cholelithiasis without evidence of acute cholecystitis. Pancreas: Cystic lesion near the uncinate process of the pancreas on series 2/image 28 measuring 2.2 cm. No ductal dilation. No evidence of acute pancreatitis. Spleen: Unremarkable. Adrenals/Urinary Tract: Unremarkable adrenal glands. Heterogenously enhancing mass in the superior pole of the right kidney measures 4.3 x 4.2 cm. No urinary calculi or  hydronephrosis. Pelvic floor laxity with the bladder below the level of the pelvic inlet. Foley catheter within the bladder. The walls of the bladder are not clearly delineated. The bladder wall mass seen on same-day pelvic ultrasound is not well demonstrated on CT. Stomach/Bowel: Moderate mixed type hiatal hernia. The stomach is distended with abrupt narrowing at the pylorus duodenal junction. No definite mass noting limitations of paucity of fat and motion. No evidence of obstruction. Moderate colonic stool load. No wall thickening. Colonic diverticulosis without diverticulitis. Vascular/Lymphatic: Aortic atherosclerosis. No enlarged abdominal or pelvic lymph nodes. Reproductive: Hysterectomy. Other: No free intraperitoneal air. Musculoskeletal: Demineralization. Postoperative changes both femurs. Remote left pubic rami fractures. No acute fracture. IMPRESSION: 1. Pelvic floor laxity with the bladder below the level of the pelvic inlet. Bladder wall thickening. The bladder wall mass seen on same-day pelvic ultrasound is not well demonstrated on CT. 2. Heterogenously enhancing mass in the superior pole of the right kidney measures 4.3 x 4.2 cm, concerning for renal cell carcinoma. 3. The stomach is distended with abrupt narrowing at the pylorus duodenal junction. No definite mass noting limitations of paucity of fat and motion. 4. Cystic lesion near the uncinate process of the pancreas measuring 2.2 cm. 5. Cholelithiasis without evidence of acute cholecystitis. 6. Colonic diverticulosis without diverticulitis. 7. Moderate mixed type hiatal hernia. Aortic Atherosclerosis (ICD10-I70.0). Electronically Signed   By: Minerva Fester M.D.   On: 12/07/2022 20:12    Pending Labs Unresulted Labs (From admission, onward)     Start     Ordered   12/07/22 2155  Urine Culture  Add-on,   AD       Question:  Indication  Answer:  Suprapubic pain   12/07/22 2155   Signed and Held  Comprehensive metabolic panel  Tomorrow  morning,   R        Signed and Held  Signed and Held  CBC  Tomorrow morning,   R        Signed and Held            Vitals/Pain Today's Vitals   12/07/22 1800 12/07/22 1830 12/07/22 2121 12/07/22 2311  BP: 139/62 (!) 148/69  (!) 147/61  Pulse: 95 97  84  Resp: 16 20  17   Temp:   99.1 F (37.3 C)   TempSrc:   Oral   SpO2: 95% 97%  100%    Isolation Precautions No active isolations  Medications Medications  ondansetron (ZOFRAN) injection 4 mg (4 mg Intravenous Given 12/07/22 1854)  lactated ringers bolus 1,000 mL (0 mLs Intravenous Stopped 12/08/22 0017)  iohexol (OMNIPAQUE) 300 MG/ML solution 100 mL (100 mLs Intravenous Contrast Given 12/07/22 1908)  metoCLOPramide (REGLAN) injection 5 mg (5 mg Intravenous Given 12/07/22 2002)  pantoprazole (PROTONIX) injection 40 mg (40 mg Intravenous Given 12/07/22 2003)  cefTRIAXone (ROCEPHIN) 1 g in sodium chloride 0.9 % 100 mL IVPB (0 g Intravenous Stopped 12/07/22 2312)    Mobility walks     Focused Assessments GI   R Recommendations: See Admitting Provider Note  Report given to:   Additional Notes: aaox4, walks but is very weak at the moment.

## 2022-12-08 NOTE — Plan of Care (Signed)
  Problem: Education: Goal: Understanding of post-operative needs will improve Outcome: Progressing Goal: Individualized Educational Video(s) Outcome: Progressing   Problem: Clinical Measurements: Goal: Postoperative complications will be avoided or minimized Outcome: Progressing   Problem: Respiratory: Goal: Will regain and/or maintain adequate ventilation Outcome: Progressing   Problem: Education: Goal: Knowledge of General Education information will improve Description: Including pain rating scale, medication(s)/side effects and non-pharmacologic comfort measures Outcome: Progressing   Problem: Health Behavior/Discharge Planning: Goal: Ability to manage health-related needs will improve Outcome: Progressing   Problem: Clinical Measurements: Goal: Ability to maintain clinical measurements within normal limits will improve Outcome: Progressing Goal: Will remain free from infection Outcome: Progressing Goal: Diagnostic test results will improve Outcome: Progressing Goal: Respiratory complications will improve Outcome: Progressing Goal: Cardiovascular complication will be avoided Outcome: Progressing   Problem: Activity: Goal: Risk for activity intolerance will decrease Outcome: Progressing   Problem: Nutrition: Goal: Adequate nutrition will be maintained Outcome: Progressing   Problem: Coping: Goal: Level of anxiety will decrease Outcome: Progressing   Problem: Elimination: Goal: Will not experience complications related to bowel motility Outcome: Progressing Goal: Will not experience complications related to urinary retention Outcome: Progressing   Problem: Pain Managment: Goal: General experience of comfort will improve Outcome: Progressing   Problem: Safety: Goal: Ability to remain free from injury will improve Outcome: Progressing   Problem: Skin Integrity: Goal: Risk for impaired skin integrity will decrease Outcome: Progressing

## 2022-12-08 NOTE — Progress Notes (Signed)
Pt pulled her NG tube out prior to arriving to unit.  On call provider notified.  This RN along with charge RN attempted to replace NG tube with 3 unsuccessful attempts. On call provider notified. Will reattempt at a later time.

## 2022-12-08 NOTE — Consult Note (Signed)
Palliative Care Consult Note                                  Date: 12/08/2022   Patient Name: Laura Cook  DOB: 14-Apr-1922  MRN: [id number]  Age / Sex: 89 y.o., female  PCP: Laura Peri, MD Referring Physician: Glade Lloyd, MD  Reason for Consultation: {Reason for Consult:23484}  HPI/Patient Profile: 89 y.o. female  with past medical history of *** admitted on 12/07/2022 with ***.   Past Medical History:  Diagnosis Date   Hypertension     Subjective:   This NP Laura Cook reviewed medical records, received report from team, assessed the patient and then meet at the patient's bedside to discuss diagnosis, prognosis, GOC, EOL wishes disposition and options.  I met with ***.   We meet to discuss diagnosis prognosis, GOC, EOL wishes, disposition and options. Concept of Palliative Care was introduced as specialized medical care for people and their families living with serious illness.  If focuses on providing relief from the symptoms and stress of a serious illness.  The goal is to improve quality of life for both the patient and the family. Values and goals of care important to patient and family were attempted to be elicited.  ***  Created space and opportunity for patient  and family to explore thoughts and feelings regarding current medical situation   Natural trajectory and current clinical status were discussed. Questions and concerns addressed. Patient  encouraged to call with questions or concerns.    Patient/Family Understanding of Illness: ***  Life Review: ***  Patient Values: ***  Goals: ***  Today's Discussion: ***  Review of Systems  Objective:   Primary Diagnoses: Present on Admission:  HTN (hypertension)  GERD (gastroesophageal reflux disease)  HLD (hyperlipidemia)  Hypokalemia  Leucocytosis  Intractable nausea and vomiting   Physical Exam  Vital Signs:  BP (!) 131/54 (BP Location:  Right Arm)   Pulse 83   Temp 98.8 F (37.1 C) (Oral)   Resp 17   SpO2 97%   Palliative Assessment/Data: ***    Advanced Care Planning:   Existing Vynca/ACP Documentation: ***  Primary Decision Maker: {Primary Decision WUJWJ:19147}  Code Status/Advance Care Planning: {Palliative Code status:23503}  A discussion was had today regarding advanced directives. Concepts specific to code status, artifical feeding and hydration, continued IV antibiotics and rehospitalization was had.  The difference between a aggressive medical intervention path and a palliative comfort care path for this patient at this time was had. ***The MOST form was introduced and discussed.***  Decisions/Changes to ACP: ***  Assessment & Plan:   Impression: ***  SUMMARY OF RECOMMENDATIONS   ***  Symptom Management:  ***  Prognosis:  {Palliative Care Prognosis:23504}  Discharge Planning:  {Palliative dispostion:23505}   Discussed with: ***    Thank you for allowing [place] to participate in the care of Laura Cook PMT will continue to support holistically.  Time Total: ***  Detailed review of medical records (labs, imaging, vital signs), medically appropriate exam, discussed with treatment team, counseling and education to patient, family, & staff, documenting clinical information, medication management, coordination of care  Signed by: Laura Dust, NP Palliative Medicine Team  Team Phone # [phone] (Nights/Weekends)  12/08/2022, 1:33 PM

## 2022-12-08 NOTE — Plan of Care (Signed)
[facility] Tonsillectomy/Adenoidectomy, Postoperative PEDS care plan entered in error.

## 2022-12-08 NOTE — Consult Note (Addendum)
Consult Note  Laura Cook 11/04/1922  [id number].    Requesting MD: Dr. Mikeal Hawthorne Chief Complaint/Reason for Consult: nausea/vomiting, possible gastric outlet obstruction  HPI:  89 y.o. female with medical history significant for HTN, pelvic floor prolapse, HLD, GERD who presented to [facility] ED 10/17 with nausea, vomiting and PO intolerance. Symptoms began 3-4 days ago. She presented to [facility] the morning of 10/17 and was diagnosed with UTI, retention in setting of pelvic floor prolapse with foley catheter placed.  CT abd/pelvis with contrast showing stomach distension with abrupt narrowing at pyloris duodenal junction. No discrete mass identified. She has a large hiatal hernia. General surgery and GI asked to see regarding obstruction.   She had NGT placed in the ED. She removed it while still in ED but it has been replaced by time of my exam this am. She states abdominal pain is a little better since NGT placement. No nausea. Not passing flatus and last BM several days ago. She has never had a colonoscopy or endoscopy. She lives with her son Greggory Stallion and he and her niece help her with health care and medications.  Family history: mother had colon cancer Substance use: none Allergies: nkda Blood thinners: none Past Surgeries: abdominal hysterectomy, open appendectomy   ROS: Reviewed and as above  History reviewed. No pertinent family history.  Past Medical History:  Diagnosis Date   Hypertension     Past Surgical History:  Procedure Laterality Date   ABDOMINAL HYSTERECTOMY     DILATION AND CURETTAGE OF UTERUS     HIP FRACTURE SURGERY Left    INTRAMEDULLARY (IM) NAIL INTERTROCHANTERIC Right 11/15/2020   Procedure: INTRAMEDULLARY (IM) NAIL INTERTROCHANTRIC;  Surgeon: Oliver Barre, MD;  Location: [facility] ORS;  Service: Orthopedics;  Laterality: Right;   YAG LASER APPLICATION Left 11/10/2013   Procedure: YAG LASER APPLICATION;  Surgeon: Susa Simmonds, MD;  Location: [facility]  ORS;  Service: Ophthalmology;  Laterality: Left;    Social History:  reports that she has never smoked. She has never been exposed to tobacco smoke. She has never used smokeless tobacco. She reports that she does not drink alcohol and does not use drugs.  Allergies: No Known Allergies  Medications Prior to Admission  Medication Sig Dispense Refill   ciprofloxacin (CIPRO) 250 MG tablet Take 250 mg by mouth 2 (two) times daily.     famotidine (PEPCID) 40 MG tablet Take 40 mg by mouth daily.     trimethoprim (TRIMPEX) 100 MG tablet Take 100 mg by mouth daily.     acetaminophen (TYLENOL) 325 MG tablet Take 2 tablets (650 mg total) by mouth every 6 (six) hours as needed for mild pain (or Fever >/= 101). 12 tablet 0   amLODipine (NORVASC) 5 MG tablet Take 5 mg by mouth daily.     aspirin EC 325 MG tablet Take 1 tablet (325 mg total) by mouth daily with breakfast. 30 tablet 3   baclofen (LIORESAL) 10 MG tablet Take 10 mg by mouth 2 (two) times daily. (Patient not taking: Reported on 10/18/2021)     cephALEXin (KEFLEX) 500 MG capsule Take 1 capsule (500 mg total) by mouth 2 (two) times daily. 10 capsule 0   Cholecalciferol (VITAMIN D3 PO) Take by mouth.     diclofenac Sodium (VOLTAREN) 1 % GEL Apply 2 g topically 2 (two) times daily.     hydrochlorothiazide (HYDRODIURIL) 50 MG tablet Take 50 mg by mouth every morning.     ketotifen (  ZADITOR) 0.025 % ophthalmic solution Place 1 drop into both eyes 2 (two) times daily.     meclizine (ANTIVERT) 25 MG tablet Take 25 mg by mouth every 8 (eight) hours as needed.     meloxicam (MOBIC) 15 MG tablet Take 15 mg by mouth daily.     metoprolol succinate (TOPROL XL) 50 MG 24 hr tablet Take 1 tablet (50 mg total) by mouth daily. Take with or immediately following a meal. 30 tablet 11   Multiple Vitamins-Minerals (PRESERVISION AREDS 2 PO) Take by mouth.     omeprazole (PRILOSEC) 20 MG capsule Take 20 mg by mouth daily.     polyethylene glycol (MIRALAX) 17 g packet  Take 17 g by mouth daily. 30 each 1   PREMARIN vaginal cream Place 1 g vaginally daily.     rosuvastatin (CRESTOR) 10 MG tablet Take 10 mg by mouth at bedtime.     sulfamethoxazole-trimethoprim (BACTRIM DS) 800-160 MG tablet Take 1 tablet by mouth 2 (two) times daily.     traMADol (ULTRAM) 50 MG tablet Take 1 tablet (50 mg total) by mouth every 6 (six) hours as needed. 10 tablet 0    Blood pressure 130/84, pulse 77, temperature 98.4 F (36.9 C), temperature source Oral, resp. rate 17, SpO2 99%. Physical Exam: General: pleasant, WD, female who is laying in bed in NAD HEENT: head is normocephalic, atraumatic.  Sclera are noninjected.  Pupils equal and round. EOMs intact.  Ears and nose without any masses or lesions.  Mouth is pink and moist Lungs: Respiratory effort nonlabored Abd: soft, mild TTP central abdomen. No distension. NGT in place with bilious output MSK: all 4 extremities are symmetrical with no cyanosis, clubbing, or edema. Skin: warm and dry with no masses, lesions, or rashes Neuro: Cranial nerves 2-12 grossly intact, sensation is normal throughout Psych: A&Ox3 with an appropriate affect.    Results for orders placed or performed during the hospital encounter of 12/07/22 (from the past 48 hour(s))  CBC with Differential     Status: Abnormal   Collection Time: 12/07/22  5:07 PM  Result Value Ref Range   WBC 12.7 (H) 4.0 - 10.5 K/uL   RBC 4.40 3.87 - 5.11 MIL/uL   Hemoglobin 12.4 12.0 - 15.0 g/dL   HCT 82.9 56.2 - 13.0 %   MCV 84.8 80.0 - 100.0 fL   MCH 28.2 26.0 - 34.0 pg   MCHC 33.2 30.0 - 36.0 g/dL   RDW 86.5 78.4 - 69.6 %   Platelets 405 (H) 150 - 400 K/uL   nRBC 0.0 0.0 - 0.2 %   Neutrophils Relative % 87 %   Neutro Abs 11.0 (H) 1.7 - 7.7 K/uL   Lymphocytes Relative 6 %   Lymphs Abs 0.8 0.7 - 4.0 K/uL   Monocytes Relative 6 %   Monocytes Absolute 0.8 0.1 - 1.0 K/uL   Eosinophils Relative 0 %   Eosinophils Absolute 0.0 0.0 - 0.5 K/uL   Basophils Relative 0 %    Basophils Absolute 0.0 0.0 - 0.1 K/uL   Immature Granulocytes 1 %   Abs Immature Granulocytes 0.09 (H) 0.00 - 0.07 K/uL    Comment: Performed at [facility], [address], [place], [place] [address]  Comprehensive metabolic panel     Status: Abnormal   Collection Time: 12/07/22  5:07 PM  Result Value Ref Range   Sodium 141 135 - 145 mmol/L   Potassium 3.3 (L) 3.5 - 5.1 mmol/L   Chloride  89 (L) 98 - 111 mmol/L   CO2 29 22 - 32 mmol/L   Glucose, Bld 147 (H) 70 - 99 mg/dL    Comment: Glucose reference range applies only to samples taken after fasting for at least 8 hours.   BUN 30 (H) 8 - 23 mg/dL   Creatinine, Ser 9.52 0.44 - 1.00 mg/dL   Calcium 84.1 8.9 - 32.4 mg/dL   Total Protein 7.6 6.5 - 8.1 g/dL   Albumin 4.4 3.5 - 5.0 g/dL   AST 18 15 - 41 U/L   ALT 14 0 - 44 U/L   Alkaline Phosphatase 74 38 - 126 U/L   Total Bilirubin 1.6 (H) 0.3 - 1.2 mg/dL   GFR, Estimated >40 >10 mL/min    Comment: (NOTE) Calculated using the CKD-EPI Creatinine Equation (2021)    Anion gap 23 (H) 5 - 15    Comment: ELECTROLYTES REPEATED TO VERIFY Performed at [facility], [address] [place], [place] [address]   Lipase, blood     Status: None   Collection Time: 12/07/22  5:07 PM  Result Value Ref Range   Lipase 37 11 - 51 U/L    Comment: Performed at [facility], [address], [place], [place] [address]  Type and screen [facility]     Status: None   Collection Time: 12/07/22  5:07 PM  Result Value Ref Range   ABO/RH(D) O POS    Antibody Screen NEG    Sample Expiration      12/10/2022,2359 Performed at [facility], [address] [place], [place] [address]   Protime-INR     Status: None   Collection Time: 12/07/22  5:07 PM  Result Value Ref Range   Prothrombin Time 13.9 11.4 - 15.2 seconds   INR 1.1 0.8 - 1.2    Comment: (NOTE) INR goal varies based on device and disease  states. Performed at [facility], [address] [place], [place] [address]   Urinalysis, Routine w reflex microscopic -Urine, Clean Catch     Status: Abnormal   Collection Time: 12/07/22  5:57 PM  Result Value Ref Range   Color, Urine YELLOW YELLOW   APPearance HAZY (A) CLEAR   Specific Gravity, Urine 1.020 1.005 - 1.030   pH 6.0 5.0 - 8.0   Glucose, UA NEGATIVE NEGATIVE mg/dL   Hgb urine dipstick SMALL (A) NEGATIVE   Bilirubin Urine NEGATIVE NEGATIVE   Ketones, ur 80 (A) NEGATIVE mg/dL   Protein, ur >=875 (A) NEGATIVE mg/dL   Nitrite NEGATIVE NEGATIVE   Leukocytes,Ua LARGE (A) NEGATIVE   RBC / HPF >50 0 - 5 RBC/hpf   WBC, UA >50 0 - 5 WBC/hpf   Bacteria, UA NONE SEEN NONE SEEN   Squamous Epithelial / HPF 0-5 0 - 5 /HPF   Mucus PRESENT     Comment: Performed at [facility], [address], [place], [place] [address]  Comprehensive metabolic panel     Status: Abnormal   Collection Time: 12/08/22  5:27 AM  Result Value Ref Range   Sodium 134 (L) 135 - 145 mmol/L   Potassium 2.7 (LL) 3.5 - 5.1 mmol/L    Comment: CRITICAL RESULT CALLED TO, READ BACK BY AND VERIFIED WITH SOBEJANA, J. RN AT [phone] ON 12/08/2022 BY MECIAL J.    Chloride 88 (L) 98 - 111 mmol/L   CO2 31 22 - 32 mmol/L   Glucose, Bld 176 (  H) 70 - 99 mg/dL    Comment: Glucose reference range applies only to samples taken after fasting for at least 8 hours.   BUN 30 (H) 8 - 23 mg/dL   Creatinine, Ser 2.95 0.44 - 1.00 mg/dL   Calcium 8.9 8.9 - 28.4 mg/dL   Total Protein 6.2 (L) 6.5 - 8.1 g/dL   Albumin 3.7 3.5 - 5.0 g/dL   AST 15 15 - 41 U/L   ALT 12 0 - 44 U/L   Alkaline Phosphatase 61 38 - 126 U/L   Total Bilirubin 0.9 0.3 - 1.2 mg/dL   GFR, Estimated >13 >24 mL/min    Comment: (NOTE) Calculated using the CKD-EPI Creatinine Equation (2021)    Anion gap 15 5 - 15    Comment: Performed at [facility], [address] [place], [place] [address]  CBC      Status: Abnormal   Collection Time: 12/08/22  5:27 AM  Result Value Ref Range   WBC 10.5 4.0 - 10.5 K/uL   RBC 3.69 (L) 3.87 - 5.11 MIL/uL   Hemoglobin 10.4 (L) 12.0 - 15.0 g/dL   HCT 72.5 (L) 36.6 - 44.0 %   MCV 87.8 80.0 - 100.0 fL   MCH 28.2 26.0 - 34.0 pg   MCHC 32.1 30.0 - 36.0 g/dL   RDW 34.7 42.5 - 95.6 %   Platelets 338 150 - 400 K/uL   nRBC 0.0 0.0 - 0.2 %    Comment: Performed at [facility], [address] [place], [place] [address]   DG Abd 1 View  Result Date: 12/08/2022 CLINICAL DATA:  Nasogastric tube placement EXAM: ABDOMEN - 1 VIEW COMPARISON:  Yesterday FINDINGS: Enteric tube with tip and side port at the stomach. Hazy density at the left base primarily from underpenetration based on prior CT. No convincing interval opacification/aspiration. No cardiac enlargement when accounting for rotation and hiatal hernia. IMPRESSION: 1. Located enteric tube with tip and side port at the stomach. 2. Large hiatal hernia. Electronically Signed   By: Tiburcio Pea M.D.   On: 12/08/2022 06:16   DG Abd 1 View  Result Date: 12/07/2022 CLINICAL DATA:  Confirm NG tube placement EXAM: ABDOMEN - 1 VIEW COMPARISON:  Same day CT abdomen and pelvis FINDINGS: Enteric tube tip and side port are in the stomach. Remainder unchanged from CT earlier today. IMPRESSION: Enteric tube tip and side-port in the stomach. Electronically Signed   By: Minerva Fester M.D.   On: 12/07/2022 20:42   CT ABDOMEN PELVIS W CONTRAST  Result Date: 12/07/2022 CLINICAL DATA:  89 year old female with lower abdominal pain coffee-ground emesis. EXAM: CT ABDOMEN AND PELVIS WITH CONTRAST TECHNIQUE: Multidetector CT imaging of the abdomen and pelvis was performed using the standard protocol following bolus administration of intravenous contrast. RADIATION DOSE REDUCTION: This exam was performed according to the departmental dose-optimization program which includes automated exposure control, adjustment of  the mA and/or kV according to patient size and/or use of iterative reconstruction technique. CONTRAST:  OMNIPAQUE IOHEXOL 300 MG/ML  SOLN COMPARISON:  CT 09/19/2022 and pelvic ultrasound earlier today FINDINGS: Lower chest: No acute abnormality. Hepatobiliary: Presumed hemangioma in the posterior right hepatic dome. Cholelithiasis without evidence of acute cholecystitis. Pancreas: Cystic lesion near the uncinate process of the pancreas on series 2/image 28 measuring 2.2 cm. No ductal dilation. No evidence of acute pancreatitis. Spleen: Unremarkable. Adrenals/Urinary Tract: Unremarkable adrenal glands. Heterogenously enhancing mass in the superior pole of the right kidney measures  4.3 x 4.2 cm. No urinary calculi or hydronephrosis. Pelvic floor laxity with the bladder below the level of the pelvic inlet. Foley catheter within the bladder. The walls of the bladder are not clearly delineated. The bladder wall mass seen on same-day pelvic ultrasound is not well demonstrated on CT. Stomach/Bowel: Moderate mixed type hiatal hernia. The stomach is distended with abrupt narrowing at the pylorus duodenal junction. No definite mass noting limitations of paucity of fat and motion. No evidence of obstruction. Moderate colonic stool load. No wall thickening. Colonic diverticulosis without diverticulitis. Vascular/Lymphatic: Aortic atherosclerosis. No enlarged abdominal or pelvic lymph nodes. Reproductive: Hysterectomy. Other: No free intraperitoneal air. Musculoskeletal: Demineralization. Postoperative changes both femurs. Remote left pubic rami fractures. No acute fracture. IMPRESSION: 1. Pelvic floor laxity with the bladder below the level of the pelvic inlet. Bladder wall thickening. The bladder wall mass seen on same-day pelvic ultrasound is not well demonstrated on CT. 2. Heterogenously enhancing mass in the superior pole of the right kidney measures 4.3 x 4.2 cm, concerning for renal cell carcinoma. 3. The stomach is  distended with abrupt narrowing at the pylorus duodenal junction. No definite mass noting limitations of paucity of fat and motion. 4. Cystic lesion near the uncinate process of the pancreas measuring 2.2 cm. 5. Cholelithiasis without evidence of acute cholecystitis. 6. Colonic diverticulosis without diverticulitis. 7. Moderate mixed type hiatal hernia. Aortic Atherosclerosis (ICD10-I70.0). Electronically Signed   By: Minerva Fester M.D.   On: 12/07/2022 20:12      Assessment/Plan Nausea/vomiting Hiatal Hernia Gastric outlet obstruction  Patient seen and examined and relevant labs and imaging reviewed. CT scan showing distended stomach and narrowing at pylorus duodenal junction without discrete mass. Reviewed with MD and pylorus is within the [facility] on CT as likely etiology of obstruction. NGT placed with high output (>1L). She is hemodynamically stable and abdominal exam reassuring. No emergent surgical intervention indicated. Continue decompression with NGT. Hopefully improves with bowel rest/decompression  FEN: NPO/NGT ID: rocephin (UTI) VTE: okay for chemical prophylaxis from surgical standpoint   I reviewed ED provider notes, hospitalist notes, last 24 h vitals and pain scores, last 48 h intake and output, last 24 h labs and trends, and last 24 h imaging results.   Eric Form, [facility] 12/08/2022, 8:02 AM Please see Amion for pager number during day hours 7:00am-4:30pm

## 2022-12-08 NOTE — Progress Notes (Addendum)
PROGRESS NOTE    DRINDA HASBERRY  MVH:[id number] DOB: 02/02/1923 DOA: 12/07/2022 PCP: Kirstie Peri, MD   Brief Narrative:   89 y.o. female with medical history significant of essential hypertension, hyperlipidemia and GERD presented with intractable nausea, vomiting and abdominal pain.  On presentation, she was found to have markedly distended stomach and small bowel with suspicion for gastric outlet obstruction.  She was started on IV fluids.  GI and general surgery were consulted.  Assessment & Plan:   Intractable nausea and vomiting secondary to possible gastric outlet obstruction, possibly from hiatal hernia -Currently has NG tube.  Continue IV fluids and IV Protonix.  Continue antiemetic as needed.  Pain management.  GI and general surgery consultation.  Possible UTI: Present on admission -Continue Rocephin.  Follow cultures.  Right kidney mass concerning for renal cell carcinoma Goals of care -Imaging concerning for right superior pole renal cell carcinoma -Will wait for palliative care consultation for goals of care discussion.  Doubt that the patient will be a surgical candidate.  Leukocytosis -Resolved  Thrombocytosis -Resolved  Hyponatremia -From poor oral intake.  Continue IV fluids.  Monitor  Hypokalemia -Replace.  Repeat a.m. labs  GERD -Continue PPI  Hyperlipidemia -Resume home regimen once able to tolerate orally  Essential hypertension -Monitor blood pressure.  Currently stable   DVT prophylaxis: SCDs Code Status: DNR.  Confirmed by the patient Family Communication: None at bedside Disposition Plan: Status is: Inpatient Remains inpatient appropriate because: Of severity of illness    Consultants: GI/general surgery/palliative care.  Procedures: None  Antimicrobials: Rocephin   Subjective: Patient seen and examined at bedside.  Feels slightly better but still has intermittent abdominal pain with some nausea.  No fever, chest pain  reported. Objective: Vitals:   12/07/22 2311 12/08/22 0113 12/08/22 0217 12/08/22 0635  BP: (!) 147/61  132/63 130/84  Pulse: 84  86 77  Resp: 17  18 17   Temp:  98.9 F (37.2 C) 98.9 F (37.2 C) 98.4 F (36.9 C)  TempSrc:  Oral  Oral  SpO2: 100%  96% 99%    Intake/Output Summary (Last 24 hours) at 12/08/2022 1129 Last data filed at 12/08/2022 8413 Gross per 24 hour  Intake --  Output 1300 ml  Net -1300 ml   There were no vitals filed for this visit.  Examination:  General exam: Appears calm and comfortable.  Elderly female lying in bed.  On room air.  Looks chronically ill and deconditioned ENT: NG tube present Respiratory system: Bilateral decreased breath sounds at bases Cardiovascular system: S1 & S2 heard, Rate controlled Gastrointestinal system: Abdomen is nondistended, soft and mildly tender. Normal bowel sounds heard. Extremities: No cyanosis, clubbing, edema  Central nervous system: Awake, slow to respond, answers questions appropriately.  Poor historian.. No focal neurological deficits. Moving extremities Skin: No rashes, lesions or ulcers Psychiatry: Flat affect.  Not agitated   Data Reviewed: I have personally reviewed following labs and imaging studies  CBC: Recent Labs  Lab 12/07/22 1707 12/08/22 0527  WBC 12.7* 10.5  NEUTROABS 11.0*  --   HGB 12.4 10.4*  HCT 37.3 32.4*  MCV 84.8 87.8  PLT 405* 338   Basic Metabolic Panel: Recent Labs  Lab 12/07/22 1707 12/08/22 0527  NA 141 134*  K 3.3* 2.7*  CL 89* 88*  CO2 29 31  GLUCOSE 147* 176*  BUN 30* 30*  CREATININE 0.80 0.78  CALCIUM 10.2 8.9   GFR: Estimated Creatinine Clearance: 27.8 mL/min (by C-G formula based on  SCr of 0.78 mg/dL). Liver Function Tests: Recent Labs  Lab 12/07/22 1707 12/08/22 0527  AST 18 15  ALT 14 12  ALKPHOS 74 61  BILITOT 1.6* 0.9  PROT 7.6 6.2*  ALBUMIN 4.4 3.7   Recent Labs  Lab 12/07/22 1707  LIPASE 37   No results for input(s): "AMMONIA" in the last  168 hours. Coagulation Profile: Recent Labs  Lab 12/07/22 1707  INR 1.1   Cardiac Enzymes: No results for input(s): "CKTOTAL", "CKMB", "CKMBINDEX", "TROPONINI" in the last 168 hours. BNP (last 3 results) No results for input(s): "PROBNP" in the last 8760 hours. HbA1C: No results for input(s): "HGBA1C" in the last 72 hours. CBG: No results for input(s): "GLUCAP" in the last 168 hours. Lipid Profile: No results for input(s): "CHOL", "HDL", "LDLCALC", "TRIG", "CHOLHDL", "LDLDIRECT" in the last 72 hours. Thyroid Function Tests: No results for input(s): "TSH", "T4TOTAL", "FREET4", "T3FREE", "THYROIDAB" in the last 72 hours. Anemia Panel: No results for input(s): "VITAMINB12", "FOLATE", "FERRITIN", "TIBC", "IRON", "RETICCTPCT" in the last 72 hours. Sepsis Labs: No results for input(s): "PROCALCITON", "LATICACIDVEN" in the last 168 hours.  Recent Results (from the past 240 hour(s))  Urine Culture     Status: Abnormal   Collection Time: 12/05/22  4:24 PM   Specimen: Urine, Random  Result Value Ref Range Status   Specimen Description   Final    URINE, RANDOM Performed at [facility] [address] [place], [place] [address]    Special Requests   Final    NONE Reflexed from [id number] Performed at [facility], [address] [place], [place] [address]    Culture (A)  Final    <10,000 COLONIES/mL INSIGNIFICANT GROWTH Performed at [facility], [address], [place], [place] [address]    Report Status 12/06/2022 FINAL  Final         Radiology Studies: DG Abd 1 View  Result Date: 12/08/2022 CLINICAL DATA:  Nasogastric tube placement EXAM: ABDOMEN - 1 VIEW COMPARISON:  Yesterday FINDINGS: Enteric tube with tip and side port at the stomach. Hazy density at the left base primarily from underpenetration based on prior CT. No convincing interval opacification/aspiration. No cardiac enlargement when accounting for rotation and hiatal  hernia. IMPRESSION: 1. Located enteric tube with tip and side port at the stomach. 2. Large hiatal hernia. Electronically Signed   By: Tiburcio Pea M.D.   On: 12/08/2022 06:16   DG Abd 1 View  Result Date: 12/07/2022 CLINICAL DATA:  Confirm NG tube placement EXAM: ABDOMEN - 1 VIEW COMPARISON:  Same day CT abdomen and pelvis FINDINGS: Enteric tube tip and side port are in the stomach. Remainder unchanged from CT earlier today. IMPRESSION: Enteric tube tip and side-port in the stomach. Electronically Signed   By: Minerva Fester M.D.   On: 12/07/2022 20:42   CT ABDOMEN PELVIS W CONTRAST  Result Date: 12/07/2022 CLINICAL DATA:  89 year old female with lower abdominal pain coffee-ground emesis. EXAM: CT ABDOMEN AND PELVIS WITH CONTRAST TECHNIQUE: Multidetector CT imaging of the abdomen and pelvis was performed using the standard protocol following bolus administration of intravenous contrast. RADIATION DOSE REDUCTION: This exam was performed according to the departmental dose-optimization program which includes automated exposure control, adjustment of the mA and/or kV according to patient size and/or use of iterative reconstruction technique. CONTRAST:  OMNIPAQUE IOHEXOL 300 MG/ML  SOLN COMPARISON:  CT 09/19/2022 and pelvic ultrasound earlier today FINDINGS: Lower chest: No acute abnormality. Hepatobiliary: Presumed hemangioma in  the posterior right hepatic dome. Cholelithiasis without evidence of acute cholecystitis. Pancreas: Cystic lesion near the uncinate process of the pancreas on series 2/image 28 measuring 2.2 cm. No ductal dilation. No evidence of acute pancreatitis. Spleen: Unremarkable. Adrenals/Urinary Tract: Unremarkable adrenal glands. Heterogenously enhancing mass in the superior pole of the right kidney measures 4.3 x 4.2 cm. No urinary calculi or hydronephrosis. Pelvic floor laxity with the bladder below the level of the pelvic inlet. Foley catheter within the bladder. The walls of  the bladder are not clearly delineated. The bladder wall mass seen on same-day pelvic ultrasound is not well demonstrated on CT. Stomach/Bowel: Moderate mixed type hiatal hernia. The stomach is distended with abrupt narrowing at the pylorus duodenal junction. No definite mass noting limitations of paucity of fat and motion. No evidence of obstruction. Moderate colonic stool load. No wall thickening. Colonic diverticulosis without diverticulitis. Vascular/Lymphatic: Aortic atherosclerosis. No enlarged abdominal or pelvic lymph nodes. Reproductive: Hysterectomy. Other: No free intraperitoneal air. Musculoskeletal: Demineralization. Postoperative changes both femurs. Remote left pubic rami fractures. No acute fracture. IMPRESSION: 1. Pelvic floor laxity with the bladder below the level of the pelvic inlet. Bladder wall thickening. The bladder wall mass seen on same-day pelvic ultrasound is not well demonstrated on CT. 2. Heterogenously enhancing mass in the superior pole of the right kidney measures 4.3 x 4.2 cm, concerning for renal cell carcinoma. 3. The stomach is distended with abrupt narrowing at the pylorus duodenal junction. No definite mass noting limitations of paucity of fat and motion. 4. Cystic lesion near the uncinate process of the pancreas measuring 2.2 cm. 5. Cholelithiasis without evidence of acute cholecystitis. 6. Colonic diverticulosis without diverticulitis. 7. Moderate mixed type hiatal hernia. Aortic Atherosclerosis (ICD10-I70.0). Electronically Signed   By: Minerva Fester M.D.   On: 12/07/2022 20:12        Scheduled Meds:  pantoprazole (PROTONIX) IV  40 mg Intravenous Q12H   Continuous Infusions:  dextrose 5% lactated ringers with KCl 20 mEq/L 75 mL/hr at 12/08/22 0742   potassium chloride 10 mEq (12/08/22 1052)          Glade Lloyd, MD [facility] 12/08/2022, 11:29 AM

## 2022-12-08 NOTE — Progress Notes (Signed)
   Brief Palliative Medicine Progress Note:  PMT consult received and chart reviewed. Goals of care completed, full note to follow.  Recommendations: Remain DNR-limited Time for outcomes on GOO resolution from NG tube If no resolution, discuss further care moving forward Palliative medicine will continue to follow   Thank you for allowing [place] to participate in the care of Laura Cook  Signed by: Wynne Dust, NP Palliative Medicine Team [facility] CHARGE  Team Phone # [phone] (Nights/Weekends)  12/08/2022, 4:29 PM

## 2022-12-08 NOTE — Plan of Care (Signed)
  Problem: Education: Goal: Understanding of post-operative needs will improve Outcome: Not Progressing Goal: Individualized Educational Video(s) Outcome: Not Progressing   Problem: Clinical Measurements: Goal: Postoperative complications will be avoided or minimized Outcome: Not Progressing   Problem: Respiratory: Goal: Will regain and/or maintain adequate ventilation Outcome: Not Progressing   Problem: Education: Goal: Knowledge of General Education information will improve Description: Including pain rating scale, medication(s)/side effects and non-pharmacologic comfort measures Outcome: Not Progressing   Problem: Health Behavior/Discharge Planning: Goal: Ability to manage health-related needs will improve Outcome: Not Progressing   Problem: Clinical Measurements: Goal: Ability to maintain clinical measurements within normal limits will improve Outcome: Not Progressing Goal: Will remain free from infection Outcome: Not Progressing Goal: Diagnostic test results will improve Outcome: Not Progressing Goal: Respiratory complications will improve Outcome: Not Progressing Goal: Cardiovascular complication will be avoided Outcome: Not Progressing   Problem: Activity: Goal: Risk for activity intolerance will decrease Outcome: Not Progressing   Problem: Nutrition: Goal: Adequate nutrition will be maintained Outcome: Not Progressing   Problem: Coping: Goal: Level of anxiety will decrease Outcome: Not Progressing   Problem: Elimination: Goal: Will not experience complications related to bowel motility Outcome: Not Progressing Goal: Will not experience complications related to urinary retention Outcome: Not Progressing   Problem: Pain Managment: Goal: General experience of comfort will improve Outcome: Not Progressing   Problem: Safety: Goal: Ability to remain free from injury will improve Outcome: Not Progressing   Problem: Skin Integrity: Goal: Risk for impaired  skin integrity will decrease Outcome: Not Progressing

## 2022-12-08 NOTE — ED Notes (Signed)
While nurse was disconnecting the pt from cardiac monitor and giving pt an update regarding care/being transported upstairs to her room, pt pulled NG tube out and handed tube to Nurse and said "here". Charge nurse and receiving nurse and MD made aware.

## 2022-12-08 NOTE — TOC CM/SW Note (Signed)
Transition of Care [facility]) - Inpatient Brief Assessment   Patient Details  Name: Laura Cook MRN: [id number] Date of Birth: 1922/06/26  Transition of Care [facility]) CM/SW Contact:    Otelia Santee, LCSW Phone Number: 12/08/2022, 2:11 PM   Clinical Narrative: Pt currently active with [facility] for palliative care. TOC will follow.    Transition of Care Asessment: Insurance and Status: Insurance coverage has been reviewed Patient has primary care physician: Yes Home environment has been reviewed: Single family home Prior level of function:: independent/ modified independent Prior/Current Home Services: Current home services ([facility]) Social Determinants of Health Reivew: SDOH reviewed no interventions necessary Readmission risk has been reviewed: Yes Transition of care needs: transition of care needs identified, TOC will continue to follow

## 2022-12-09 DIAGNOSIS — R112 Nausea with vomiting, unspecified: Secondary | ICD-10-CM | POA: Diagnosis not present

## 2022-12-09 DIAGNOSIS — Z66 Do not resuscitate: Secondary | ICD-10-CM | POA: Diagnosis not present

## 2022-12-09 DIAGNOSIS — Z515 Encounter for palliative care: Secondary | ICD-10-CM | POA: Diagnosis not present

## 2022-12-09 DIAGNOSIS — Z7189 Other specified counseling: Secondary | ICD-10-CM | POA: Diagnosis not present

## 2022-12-09 LAB — CBC WITH DIFFERENTIAL/PLATELET
Abs Immature Granulocytes: 0.04 10*3/uL (ref 0.00–0.07)
Basophils Absolute: 0 10*3/uL (ref 0.0–0.1)
Basophils Relative: 0 %
Eosinophils Absolute: 0.1 10*3/uL (ref 0.0–0.5)
Eosinophils Relative: 1 %
HCT: 30.3 % — ABNORMAL LOW (ref 36.0–46.0)
Hemoglobin: 9.4 g/dL — ABNORMAL LOW (ref 12.0–15.0)
Immature Granulocytes: 0 %
Lymphocytes Relative: 11 %
Lymphs Abs: 1 10*3/uL (ref 0.7–4.0)
MCH: 28 pg (ref 26.0–34.0)
MCHC: 31 g/dL (ref 30.0–36.0)
MCV: 90.2 fL (ref 80.0–100.0)
Monocytes Absolute: 0.9 10*3/uL (ref 0.1–1.0)
Monocytes Relative: 9 %
Neutro Abs: 7.3 10*3/uL (ref 1.7–7.7)
Neutrophils Relative %: 79 %
Platelets: 290 10*3/uL (ref 150–400)
RBC: 3.36 MIL/uL — ABNORMAL LOW (ref 3.87–5.11)
RDW: 14.9 % (ref 11.5–15.5)
WBC: 9.4 10*3/uL (ref 4.0–10.5)
nRBC: 0 % (ref 0.0–0.2)

## 2022-12-09 LAB — COMPREHENSIVE METABOLIC PANEL
ALT: 12 U/L (ref 0–44)
AST: 18 U/L (ref 15–41)
Albumin: 3.2 g/dL — ABNORMAL LOW (ref 3.5–5.0)
Alkaline Phosphatase: 55 U/L (ref 38–126)
Anion gap: 11 (ref 5–15)
BUN: 24 mg/dL — ABNORMAL HIGH (ref 8–23)
CO2: 30 mmol/L (ref 22–32)
Calcium: 8.9 mg/dL (ref 8.9–10.3)
Chloride: 95 mmol/L — ABNORMAL LOW (ref 98–111)
Creatinine, Ser: 0.53 mg/dL (ref 0.44–1.00)
GFR, Estimated: >60 mL/min (ref >60)
Glucose, Bld: 107 mg/dL — ABNORMAL HIGH (ref 70–99)
Potassium: 3.2 mmol/L — ABNORMAL LOW (ref 3.5–5.1)
Sodium: 136 mmol/L (ref 135–145)
Total Bilirubin: 0.4 mg/dL (ref 0.3–1.2)
Total Protein: 5.6 g/dL — ABNORMAL LOW (ref 6.5–8.1)

## 2022-12-09 LAB — URINE CULTURE: Culture: NO GROWTH

## 2022-12-09 LAB — MAGNESIUM: Magnesium: 2 mg/dL (ref 1.7–2.4)

## 2022-12-09 MED ORDER — POTASSIUM CHLORIDE CRYS ER 20 MEQ PO TBCR
40.0000 meq | EXTENDED_RELEASE_TABLET | ORAL | Status: AC
  Administered 2022-12-09 (×2): 40 meq via ORAL
  Filled 2022-12-09 (×2): qty 2

## 2022-12-09 MED ORDER — MELATONIN 5 MG PO TABS
5.0000 mg | ORAL_TABLET | Freq: Every evening | ORAL | Status: DC | PRN
  Administered 2022-12-10 – 2022-12-11 (×2): 5 mg via ORAL
  Filled 2022-12-09 (×2): qty 1

## 2022-12-09 MED ORDER — ACETAMINOPHEN 325 MG PO TABS
650.0000 mg | ORAL_TABLET | Freq: Four times a day (QID) | ORAL | Status: DC | PRN
  Administered 2022-12-09: 650 mg via ORAL
  Filled 2022-12-09 (×2): qty 2

## 2022-12-09 MED ORDER — ACETAMINOPHEN 160 MG/5ML PO SOLN
650.0000 mg | Freq: Four times a day (QID) | ORAL | Status: DC | PRN
  Administered 2022-12-09 – 2022-12-14 (×4): 650 mg via ORAL
  Filled 2022-12-09 (×5): qty 20.3

## 2022-12-09 NOTE — Progress Notes (Signed)
PROGRESS NOTE    Laura Cook  MVH:[id number] DOB: [birth year] DOA: 12/07/2022 PCP: Kirstie Peri, MD   Brief Narrative:   89 y.o. female with medical history significant of essential hypertension, hyperlipidemia and GERD presented with intractable nausea, vomiting and abdominal pain.  On presentation, she was found to have markedly distended stomach and small bowel with suspicion for gastric outlet obstruction.  She was started on IV fluids.  GI and general surgery were consulted.  Palliative care was also consulted.  Assessment & Plan:   Intractable nausea and vomiting secondary to possible gastric outlet obstruction, possibly from hiatal hernia -Currently has NG tube.  Continue IV fluids and IV Protonix.  Continue antiemetic as needed.  Pain management.  GI has signed off.  General surgery following.  Follow recommendations.  Possible UTI: Present on admission -Continue Rocephin.  Follow urine cultures.  Right kidney mass concerning for renal cell carcinoma Goals of care -Imaging concerning for right superior pole renal cell carcinoma -Will wait for palliative care consultation for goals of care discussion.  Doubt that the patient will be a surgical candidate.  Communicated with Dr. McKenzie/urology via secure chat on 12/08/2022: Recommended outpatient follow-up with urology  Leukocytosis -Resolved  Thrombocytosis -Resolved  Hyponatremia -From poor oral intake.  Resolved.  Currently on IV fluids.  Hypokalemia -Replace.  Repeat a.m. labs  GERD -Continue PPI  Hyperlipidemia -Resume home regimen once able to tolerate orally  Essential hypertension -Monitor blood pressure.  Currently stable   DVT prophylaxis: SCDs Code Status: DNR.   Family Communication: None at bedside Disposition Plan: Status is: Inpatient Remains inpatient appropriate because: Of severity of illness    Consultants: GI/general surgery/palliative care.  Procedures: None  Antimicrobials:  Rocephin   Subjective: Patient seen and examined at bedside.  Feels slightly better with no nausea or vomiting.  Denies worsening abdominal pain.  No fever or chest pain reported.   Objective: Vitals:   12/08/22 1205 12/08/22 1443 12/08/22 2007 12/09/22 0436  BP: (!) 131/54 (!) 155/52 (!) 144/56 (!) 158/61  Pulse: 83 78 69 75  Resp:  15 18 17   Temp: 98.8 F (37.1 C) 99.3 F (37.4 C) 99 F (37.2 C) 98.5 F (36.9 C)  TempSrc: Oral Oral Oral Oral  SpO2: 97% 98% 97% 97%    Intake/Output Summary (Last 24 hours) at 12/09/2022 0842 Last data filed at 12/09/2022 0700 Gross per 24 hour  Intake 0 ml  Output 1400 ml  Net -1400 ml   There were no vitals filed for this visit.  Examination:  General: Currently on room air.  No distress.  Elderly female lying in bed.  Chronically ill and deconditioned looking. ENT/neck: NG tube still present.. No thyromegaly.  JVD is not elevated  respiratory: Decreased breath sounds at bases bilaterally with some crackles; no wheezing  CVS: S1-S2 heard, rate controlled currently Abdominal: Soft, mild ender tender, slightly distended; no organomegaly, bowel sounds are heard Extremities: Trace lower extremity edema; no cyanosis  CNS: Awake; still extremely slow to respond and poor historian.  No focal neurologic deficit.  Moves extremities Lymph: No obvious lymphadenopathy Skin: No obvious ecchymosis/lesions  psych: Mostly flat affect.  Currently not agitated.   Musculoskeletal: No obvious joint swelling/deformity   Data Reviewed: I have personally reviewed following labs and imaging studies  CBC: Recent Labs  Lab 12/07/22 1707 12/08/22 0527 12/09/22 0523  WBC 12.7* 10.5 9.4  NEUTROABS 11.0*  --  7.3  HGB 12.4 10.4* 9.4*  HCT 37.3 32.4* 30.3*  MCV 84.8 87.8 90.2  PLT 405* 338 290   Basic Metabolic Panel: Recent Labs  Lab 12/07/22 1707 12/08/22 0527 12/09/22 0523  NA 141 134* 136  K 3.3* 2.7* 3.2*  CL 89* 88* 95*  CO2 29 31 30    GLUCOSE 147* 176* 107*  BUN 30* 30* 24*  CREATININE 0.80 0.78 0.53  CALCIUM 10.2 8.9 8.9  MG  --   --  2.0   GFR: Estimated Creatinine Clearance: 27.8 mL/min (by C-G formula based on SCr of 0.53 mg/dL). Liver Function Tests: Recent Labs  Lab 12/07/22 1707 12/08/22 0527 12/09/22 0523  AST 18 15 18   ALT 14 12 12   ALKPHOS 74 61 55  BILITOT 1.6* 0.9 0.4  PROT 7.6 6.2* 5.6*  ALBUMIN 4.4 3.7 3.2*   Recent Labs  Lab 12/07/22 1707  LIPASE 37   No results for input(s): "AMMONIA" in the last 168 hours. Coagulation Profile: Recent Labs  Lab 12/07/22 1707  INR 1.1   Cardiac Enzymes: No results for input(s): "CKTOTAL", "CKMB", "CKMBINDEX", "TROPONINI" in the last 168 hours. BNP (last 3 results) No results for input(s): "PROBNP" in the last 8760 hours. HbA1C: No results for input(s): "HGBA1C" in the last 72 hours. CBG: No results for input(s): "GLUCAP" in the last 168 hours. Lipid Profile: No results for input(s): "CHOL", "HDL", "LDLCALC", "TRIG", "CHOLHDL", "LDLDIRECT" in the last 72 hours. Thyroid Function Tests: No results for input(s): "TSH", "T4TOTAL", "FREET4", "T3FREE", "THYROIDAB" in the last 72 hours. Anemia Panel: No results for input(s): "VITAMINB12", "FOLATE", "FERRITIN", "TIBC", "IRON", "RETICCTPCT" in the last 72 hours. Sepsis Labs: No results for input(s): "PROCALCITON", "LATICACIDVEN" in the last 168 hours.  Recent Results (from the past 240 hour(s))  Urine Culture     Status: Abnormal   Collection Time: 12/05/22  4:24 PM   Specimen: Urine, Random  Result Value Ref Range Status   Specimen Description   Final    URINE, RANDOM Performed at [facility], [address] [place], [place] [address]    Special Requests   Final    NONE Reflexed from [id number] Performed at [facility], [address] [place], [place] [address]    Culture (A)  Final    <10,000 COLONIES/mL INSIGNIFICANT GROWTH Performed at [facility], [address], [place], [place] [address]    Report Status 12/06/2022 FINAL  Final  Urine Culture     Status: None   Collection Time: 12/07/22  5:57 PM   Specimen: Urine, Catheterized  Result Value Ref Range Status   Specimen Description   Final    URINE, CATHETERIZED Performed at [facility] , [address] [place], [place] [address]    Special Requests   Final    NONE Performed at [facility], [address] [place], [place] [address]    Culture   Final    NO GROWTH Performed at [facility], [address] [place], [place] [address]    Report Status 12/09/2022 FINAL  Final         Radiology Studies: DG Abd 1 View  Result Date: 12/08/2022 CLINICAL DATA:  Nasogastric tube placement EXAM: ABDOMEN - 1 VIEW COMPARISON:  Yesterday FINDINGS: Enteric tube with tip and side port at the stomach. Hazy density at the left base primarily from underpenetration based on prior CT. No convincing interval opacification/aspiration. No cardiac enlargement when accounting for rotation and hiatal hernia. IMPRESSION: 1. Located enteric tube with tip  and side port at the stomach. 2. Large hiatal hernia. Electronically Signed   By: Tiburcio Pea M.D.   On: 12/08/2022 06:16   DG Abd 1 View  Result Date: 12/07/2022 CLINICAL DATA:  Confirm NG tube placement EXAM: ABDOMEN - 1 VIEW COMPARISON:  Same day CT abdomen and pelvis FINDINGS: Enteric tube tip and side port are in the stomach. Remainder unchanged from CT earlier today. IMPRESSION: Enteric tube tip and side-port in the stomach. Electronically Signed   By: Minerva Fester M.D.   On: 12/07/2022 20:42   CT ABDOMEN PELVIS W CONTRAST  Result Date: 12/07/2022 CLINICAL DATA:  89 year old female with lower abdominal pain coffee-ground emesis. EXAM: CT ABDOMEN AND PELVIS WITH CONTRAST TECHNIQUE: Multidetector CT imaging of the abdomen and pelvis was performed using the standard protocol  following bolus administration of intravenous contrast. RADIATION DOSE REDUCTION: This exam was performed according to the departmental dose-optimization program which includes automated exposure control, adjustment of the mA and/or kV according to patient size and/or use of iterative reconstruction technique. CONTRAST:  OMNIPAQUE IOHEXOL 300 MG/ML  SOLN COMPARISON:  CT 09/19/2022 and pelvic ultrasound earlier today FINDINGS: Lower chest: No acute abnormality. Hepatobiliary: Presumed hemangioma in the posterior right hepatic dome. Cholelithiasis without evidence of acute cholecystitis. Pancreas: Cystic lesion near the uncinate process of the pancreas on series 2/image 28 measuring 2.2 cm. No ductal dilation. No evidence of acute pancreatitis. Spleen: Unremarkable. Adrenals/Urinary Tract: Unremarkable adrenal glands. Heterogenously enhancing mass in the superior pole of the right kidney measures 4.3 x 4.2 cm. No urinary calculi or hydronephrosis. Pelvic floor laxity with the bladder below the level of the pelvic inlet. Foley catheter within the bladder. The walls of the bladder are not clearly delineated. The bladder wall mass seen on same-day pelvic ultrasound is not well demonstrated on CT. Stomach/Bowel: Moderate mixed type hiatal hernia. The stomach is distended with abrupt narrowing at the pylorus duodenal junction. No definite mass noting limitations of paucity of fat and motion. No evidence of obstruction. Moderate colonic stool load. No wall thickening. Colonic diverticulosis without diverticulitis. Vascular/Lymphatic: Aortic atherosclerosis. No enlarged abdominal or pelvic lymph nodes. Reproductive: Hysterectomy. Other: No free intraperitoneal air. Musculoskeletal: Demineralization. Postoperative changes both femurs. Remote left pubic rami fractures. No acute fracture. IMPRESSION: 1. Pelvic floor laxity with the bladder below the level of the pelvic inlet. Bladder wall thickening. The bladder wall mass  seen on same-day pelvic ultrasound is not well demonstrated on CT. 2. Heterogenously enhancing mass in the superior pole of the right kidney measures 4.3 x 4.2 cm, concerning for renal cell carcinoma. 3. The stomach is distended with abrupt narrowing at the pylorus duodenal junction. No definite mass noting limitations of paucity of fat and motion. 4. Cystic lesion near the uncinate process of the pancreas measuring 2.2 cm. 5. Cholelithiasis without evidence of acute cholecystitis. 6. Colonic diverticulosis without diverticulitis. 7. Moderate mixed type hiatal hernia. Aortic Atherosclerosis (ICD10-I70.0). Electronically Signed   By: Minerva Fester M.D.   On: 12/07/2022 20:12        Scheduled Meds:  Chlorhexidine Gluconate Cloth  6 each Topical Daily   pantoprazole (PROTONIX) IV  40 mg Intravenous Q12H   Continuous Infusions:  cefTRIAXone (ROCEPHIN)  IV 1 g (12/08/22 2243)          Glade Lloyd, MD [facility] 12/09/2022, 8:42 AM

## 2022-12-09 NOTE — Progress Notes (Signed)
Daily Progress Note   Patient Name: Laura Cook       Date: 12/09/2022 DOB: 10/24/1922  Age: 89 y.o. MRN#: [id number] Attending Physician: Glade Lloyd, MD Primary Care Physician: Kirstie Peri, MD Admit Date: 12/07/2022 Length of Stay: 2 days  Reason for Consultation/Follow-up: Establishing goals of care  HPI/Patient Profile:  89 y.o. female  with past medical history of essential hypertension, hyperlipidemia and GERD presented with intractable nausea, vomiting and abdominal pain. She was admitted on 12/07/2022 with intractable nausea and vomiting secondary to GOO, possible UTI, right kidney mass concerning for RCC, and others.    PMT was consulted for GOC conversations.  Subjective:   Subjective: Chart Reviewed. Updates received. Patient Assessed. Created space and opportunity for patient  and family to explore thoughts and feelings regarding current medical situation.  Today's Discussion: Today saw the patient at bedside, no family was present.  She appeared comfortable laying in the bed.  States some very mild abdominal discomfort but no overt pain.  NG tube is clamped and a cup of water is at her bedside.  She states she was able to have some food this morning and some liquids.  So far she is tolerating it well.  Discussed plan for possibly removing NG tube per surgery if she continues to tolerate liquids.  We again discussed hope that her outlet obstruction will resolve with NG tube.  Again reminded her we can have further discussions if this is not the case.  I assisted her in adjusting in bed and fixing her pillows.  I helped her turn on the TV to a channel she would enjoy.  I asked the nurse to bring her cup of coffee, per her request.  She denies any further needs at this time.  I provided emotional and general support through therapeutic listening, empathy, sharing of stories, therapeutic touch, and other techniques. I answered all questions and addressed all concerns to the  best of my ability.  Review of Systems  Constitutional:  Positive for fatigue.  Respiratory:  Negative for shortness of breath.   Cardiovascular:  Negative for chest pain.  Gastrointestinal:  Positive for abdominal pain (mild). Negative for nausea and vomiting.    Objective:   Vital Signs:  BP (!) 158/61 (BP Location: Right Arm)   Pulse 75   Temp 98.5 F (36.9 C) (Oral)   Resp 17   SpO2 97%   Physical Exam: Physical Exam Vitals and nursing note reviewed.  Constitutional:      General: She is not in acute distress.    Appearance: She is not toxic-appearing.     Comments: Appears frail  Cardiovascular:     Rate and Rhythm: Normal rate.  Pulmonary:     Effort: Pulmonary effort is normal. No respiratory distress.     Breath sounds: No wheezing or rhonchi.  Abdominal:     General: Abdomen is flat. Bowel sounds are normal.     Palpations: Abdomen is soft.     Tenderness: There is no abdominal tenderness.  Skin:    General: Skin is warm and dry.  Neurological:     General: No focal deficit present.     Mental Status: She is alert.  Psychiatric:        Mood and Affect: Mood normal.        Behavior: Behavior normal.     Palliative Assessment/Data: 40%    Existing Vynca/ACP Documentation: None  Assessment & Plan:   Impression: Present on Admission:  HTN (  hypertension)  GERD (gastroesophageal reflux disease)  HLD (hyperlipidemia)  Hypokalemia  Leucocytosis  Intractable nausea and vomiting  89 year old female with acute presentation of chronic comorbidities as described above.  At this point she is gastric outlet obstruction related to a large hiatal hernia with her pylorus sitting in the chest.  NG tube is in place and is having good output, patient feels much better.  Hopefully this will resolve with NG tube placement.  Surgery has seen the patient and she is not a surgical candidate.  She understands that if her GOO does not resolve with NG tube then options may  be limited moving forward.  They mentioned independently to hospice would be a viable option if they got that point.  Overall prognosis guarded.  SUMMARY OF RECOMMENDATIONS   Remain DNR-limited Does not think she would want feeding tubes Time for outcomes Hopefully she'll tolerate NGT claming and advancement of diet If she declines will need further GOC discussions Yesterday seemed open to idea of hospice, if needed (niece is Charity fundraiser, previously worked with hospice) Palliative medicine will continue to follow   Symptom Management:  Per primary team PMT is available to assist as needed  Code Status: DNR-Limited  Prognosis: Unable to determine  Discharge Planning: To Be Determined  Discussed with: Patient, medical team, nursing team  Thank you for allowing [place] to participate in the care of Laura Cook PMT will continue to support holistically.  Time Total: 40 min  Detailed review of medical records (labs, imaging, vital signs), medically appropriate exam, discussed with treatment team, counseling and education to patient, family, & staff, documenting clinical information, medication management, coordination of care  Wynne Dust, NP Palliative Medicine Team  Team Phone # [phone] (Nights/Weekends)  10/19/2020, 8:17 AM

## 2022-12-09 NOTE — Progress Notes (Signed)
Subjective/Chief Complaint: PT doing well this AM States he stomach feels better No n/v   Objective: Vital signs in last 24 hours: Temp:  [98.5 F (36.9 C)-99.3 F (37.4 C)] 98.5 F (36.9 C) (10/19 0436) Pulse Rate:  [69-83] 75 (10/19 0436) Resp:  [15-18] 17 (10/19 0436) BP: (131-158)/(52-61) 158/61 (10/19 0436) SpO2:  [93 %-98 %] 97 % (10/19 0436)    Intake/Output from previous day: 10/18 0701 - 10/19 0700 In: 0  Out: 1400 [Urine:800; Emesis/NG output:600] Intake/Output this shift: No intake/output data recorded.  PE:  Constitutional: No acute distress, conversant, appears states age. Eyes: Anicteric sclerae, moist conjunctiva, no lid lag Lungs: Clear to auscultation bilaterally, normal respiratory effort CV: regular rate and rhythm, no murmurs, no peripheral edema, pedal pulses 2+ GI: Soft, no masses or hepatosplenomegaly, non-tender to palpation Skin: No rashes, palpation reveals normal turgor Psychiatric: appropriate judgment and insight, oriented to person, place, and time   Lab Results:  Recent Labs    12/08/22 0527 12/09/22 0523  WBC 10.5 9.4  HGB 10.4* 9.4*  HCT 32.4* 30.3*  PLT 338 290   BMET Recent Labs    12/08/22 0527 12/09/22 0523  NA 134* 136  K 2.7* 3.2*  CL 88* 95*  CO2 31 30  GLUCOSE 176* 107*  BUN 30* 24*  CREATININE 0.78 0.53  CALCIUM 8.9 8.9   PT/INR Recent Labs    12/07/22 1707  LABPROT 13.9  INR 1.1   ABG No results for input(s): "PHART", "HCO3" in the last 72 hours.  Invalid input(s): "PCO2", "PO2"  Studies/Results: DG Abd 1 View  Result Date: 12/08/2022 CLINICAL DATA:  Nasogastric tube placement EXAM: ABDOMEN - 1 VIEW COMPARISON:  Yesterday FINDINGS: Enteric tube with tip and side port at the stomach. Hazy density at the left base primarily from underpenetration based on prior CT. No convincing interval opacification/aspiration. No cardiac enlargement when accounting for rotation and hiatal hernia. IMPRESSION:  1. Located enteric tube with tip and side port at the stomach. 2. Large hiatal hernia. Electronically Signed   By: Tiburcio Pea M.D.   On: 12/08/2022 06:16   DG Abd 1 View  Result Date: 12/07/2022 CLINICAL DATA:  Confirm NG tube placement EXAM: ABDOMEN - 1 VIEW COMPARISON:  Same day CT abdomen and pelvis FINDINGS: Enteric tube tip and side port are in the stomach. Remainder unchanged from CT earlier today. IMPRESSION: Enteric tube tip and side-port in the stomach. Electronically Signed   By: Minerva Fester M.D.   On: 12/07/2022 20:42   CT ABDOMEN PELVIS W CONTRAST  Result Date: 12/07/2022 CLINICAL DATA:  89 year old female with lower abdominal pain coffee-ground emesis. EXAM: CT ABDOMEN AND PELVIS WITH CONTRAST TECHNIQUE: Multidetector CT imaging of the abdomen and pelvis was performed using the standard protocol following bolus administration of intravenous contrast. RADIATION DOSE REDUCTION: This exam was performed according to the departmental dose-optimization program which includes automated exposure control, adjustment of the mA and/or kV according to patient size and/or use of iterative reconstruction technique. CONTRAST:  OMNIPAQUE IOHEXOL 300 MG/ML  SOLN COMPARISON:  CT 09/19/2022 and pelvic ultrasound earlier today FINDINGS: Lower chest: No acute abnormality. Hepatobiliary: Presumed hemangioma in the posterior right hepatic dome. Cholelithiasis without evidence of acute cholecystitis. Pancreas: Cystic lesion near the uncinate process of the pancreas on series 2/image 28 measuring 2.2 cm. No ductal dilation. No evidence of acute pancreatitis. Spleen: Unremarkable. Adrenals/Urinary Tract: Unremarkable adrenal glands. Heterogenously enhancing mass in the superior pole of the right kidney measures  4.3 x 4.2 cm. No urinary calculi or hydronephrosis. Pelvic floor laxity with the bladder below the level of the pelvic inlet. Foley catheter within the bladder. The walls of the bladder are not  clearly delineated. The bladder wall mass seen on same-day pelvic ultrasound is not well demonstrated on CT. Stomach/Bowel: Moderate mixed type hiatal hernia. The stomach is distended with abrupt narrowing at the pylorus duodenal junction. No definite mass noting limitations of paucity of fat and motion. No evidence of obstruction. Moderate colonic stool load. No wall thickening. Colonic diverticulosis without diverticulitis. Vascular/Lymphatic: Aortic atherosclerosis. No enlarged abdominal or pelvic lymph nodes. Reproductive: Hysterectomy. Other: No free intraperitoneal air. Musculoskeletal: Demineralization. Postoperative changes both femurs. Remote left pubic rami fractures. No acute fracture. IMPRESSION: 1. Pelvic floor laxity with the bladder below the level of the pelvic inlet. Bladder wall thickening. The bladder wall mass seen on same-day pelvic ultrasound is not well demonstrated on CT. 2. Heterogenously enhancing mass in the superior pole of the right kidney measures 4.3 x 4.2 cm, concerning for renal cell carcinoma. 3. The stomach is distended with abrupt narrowing at the pylorus duodenal junction. No definite mass noting limitations of paucity of fat and motion. 4. Cystic lesion near the uncinate process of the pancreas measuring 2.2 cm. 5. Cholelithiasis without evidence of acute cholecystitis. 6. Colonic diverticulosis without diverticulitis. 7. Moderate mixed type hiatal hernia. Aortic Atherosclerosis (ICD10-I70.0). Electronically Signed   By: Minerva Fester M.D.   On: 12/07/2022 20:12    Anti-infectives: Anti-infectives (From admission, onward)    Start     Dose/Rate Route Frequency Ordered Stop   12/08/22 2200  cefTRIAXone (ROCEPHIN) 1 g in sodium chloride 0.9 % 100 mL IVPB        1 g 200 mL/hr over 30 Minutes Intravenous Every 24 hours 12/08/22 1143     12/07/22 2200  cefTRIAXone (ROCEPHIN) 1 g in sodium chloride 0.9 % 100 mL IVPB        1 g 200 mL/hr over 30 Minutes Intravenous  Once  12/07/22 2155 12/07/22 2312       Assessment/Plan: Nausea/vomiting Hiatal Hernia Gastric outlet obstruction    Pt better and stomach decompressed.  Will allow to start clear and see how she does with that.  If tol w/o n/v x 6hr OK ot DC NGT Will plan on avoiding surgery If possible.   FEN: start  CLD.  DC NGT later today if tol clears ID: rocephin (UTI) VTE: okay for chemical prophylaxis from surgical standpoint     I reviewed provider notes,  last 24 h vitals and pain scores, last 48 h intake and output, last 24 h labs and trends, and last 24 h imaging results.  LOS: 2 days    Axel Filler 12/09/2022

## 2022-12-09 NOTE — Plan of Care (Signed)
Pt states she does not have pain. Some disorientation at night noted. Pt needed to wear mittens temporarily for pulling at tubes and lines. Only her IV was taken completely out and a new one was placed. Problem: Education: Goal: Knowledge of General Education information will improve Description: Including pain rating scale, medication(s)/side effects and non-pharmacologic comfort measures Outcome: Progressing   Problem: Health Behavior/Discharge Planning: Goal: Ability to manage health-related needs will improve Outcome: Progressing   Problem: Clinical Measurements: Goal: Ability to maintain clinical measurements within normal limits will improve Outcome: Progressing Goal: Will remain free from infection Outcome: Progressing Goal: Diagnostic test results will improve Outcome: Progressing Goal: Respiratory complications will improve Outcome: Progressing Goal: Cardiovascular complication will be avoided Outcome: Progressing   Problem: Activity: Goal: Risk for activity intolerance will decrease Outcome: Progressing   Problem: Nutrition: Goal: Adequate nutrition will be maintained Outcome: Progressing   Problem: Coping: Goal: Level of anxiety will decrease Outcome: Progressing   Problem: Elimination: Goal: Will not experience complications related to bowel motility Outcome: Progressing Goal: Will not experience complications related to urinary retention Outcome: Progressing   Problem: Pain Managment: Goal: General experience of comfort will improve Outcome: Progressing   Problem: Safety: Goal: Ability to remain free from injury will improve Outcome: Progressing   Problem: Skin Integrity: Goal: Risk for impaired skin integrity will decrease Outcome: Progressing

## 2022-12-09 NOTE — Plan of Care (Signed)

## 2022-12-10 DIAGNOSIS — R112 Nausea with vomiting, unspecified: Secondary | ICD-10-CM | POA: Diagnosis not present

## 2022-12-10 LAB — CBC WITH DIFFERENTIAL/PLATELET
Abs Immature Granulocytes: 0.05 10*3/uL (ref 0.00–0.07)
Basophils Absolute: 0.1 10*3/uL (ref 0.0–0.1)
Basophils Relative: 1 %
Eosinophils Absolute: 0.3 10*3/uL (ref 0.0–0.5)
Eosinophils Relative: 3 %
HCT: 28.5 % — ABNORMAL LOW (ref 36.0–46.0)
Hemoglobin: 8.9 g/dL — ABNORMAL LOW (ref 12.0–15.0)
Immature Granulocytes: 1 %
Lymphocytes Relative: 17 %
Lymphs Abs: 1.2 10*3/uL (ref 0.7–4.0)
MCH: 28.2 pg (ref 26.0–34.0)
MCHC: 31.2 g/dL (ref 30.0–36.0)
MCV: 90.2 fL (ref 80.0–100.0)
Monocytes Absolute: 0.7 10*3/uL (ref 0.1–1.0)
Monocytes Relative: 10 %
Neutro Abs: 5.1 10*3/uL (ref 1.7–7.7)
Neutrophils Relative %: 68 %
Platelets: 276 10*3/uL (ref 150–400)
RBC: 3.16 MIL/uL — ABNORMAL LOW (ref 3.87–5.11)
RDW: 14.7 % (ref 11.5–15.5)
WBC: 7.4 10*3/uL (ref 4.0–10.5)
nRBC: 0 % (ref 0.0–0.2)

## 2022-12-10 LAB — MAGNESIUM: Magnesium: 1.8 mg/dL (ref 1.7–2.4)

## 2022-12-10 LAB — BASIC METABOLIC PANEL
Anion gap: 8 (ref 5–15)
BUN: 19 mg/dL (ref 8–23)
CO2: 26 mmol/L (ref 22–32)
Calcium: 8.6 mg/dL — ABNORMAL LOW (ref 8.9–10.3)
Chloride: 100 mmol/L (ref 98–111)
Creatinine, Ser: 0.56 mg/dL (ref 0.44–1.00)
GFR, Estimated: >60 mL/min (ref >60)
Glucose, Bld: 95 mg/dL (ref 70–99)
Potassium: 4.2 mmol/L (ref 3.5–5.1)
Sodium: 134 mmol/L — ABNORMAL LOW (ref 135–145)

## 2022-12-10 MED ORDER — TRAMADOL HCL 50 MG PO TABS
50.0000 mg | ORAL_TABLET | Freq: Once | ORAL | Status: AC
  Administered 2022-12-10: 50 mg via ORAL
  Filled 2022-12-10: qty 1

## 2022-12-10 MED ORDER — HALOPERIDOL LACTATE 5 MG/ML IJ SOLN
1.0000 mg | Freq: Four times a day (QID) | INTRAMUSCULAR | Status: DC | PRN
  Administered 2022-12-10: 1 mg via INTRAVENOUS
  Filled 2022-12-10: qty 1

## 2022-12-10 NOTE — Progress Notes (Signed)
   Subjective/Chief Complaint: PT doing well this AM Tol NGT out and clears   Objective: Vital signs in last 24 hours: Temp:  [97.5 F (36.4 C)-98.5 F (36.9 C)] 98.1 F (36.7 C) (10/20 0505) Pulse Rate:  [56-69] 61 (10/20 0505) Resp:  [16-18] 16 (10/20 0505) BP: (139-156)/(62-65) 156/65 (10/20 0505) SpO2:  [99 %-100 %] 99 % (10/20 0505)    Intake/Output from previous day: 10/19 0701 - 10/20 0700 In: 670 [P.O.:570; IV Piggyback:100] Out: 900 [Urine:750; Emesis/NG output:150] Intake/Output this shift: No intake/output data recorded.  PE:  Constitutional: No acute distress, conversant, appears states age. Eyes: Anicteric sclerae, moist conjunctiva, no lid lag Lungs: Clear to auscultation bilaterally, normal respiratory effort CV: regular rate and rhythm, no murmurs, no peripheral edema, pedal pulses 2+ GI: Soft, no masses or hepatosplenomegaly, non-tender to palpation Skin: No rashes, palpation reveals normal turgor Psychiatric: appropriate judgment and insight, oriented to person, place, and time   Lab Results:  Recent Labs    12/09/22 0523 12/10/22 0528  WBC 9.4 7.4  HGB 9.4* 8.9*  HCT 30.3* 28.5*  PLT 290 276   BMET Recent Labs    12/09/22 0523 12/10/22 0528  NA 136 134*  K 3.2* 4.2  CL 95* 100  CO2 30 26  GLUCOSE 107* 95  BUN 24* 19  CREATININE 0.53 0.56  CALCIUM 8.9 8.6*   PT/INR Recent Labs    12/07/22 1707  LABPROT 13.9  INR 1.1   ABG No results for input(s): "PHART", "HCO3" in the last 72 hours.  Invalid input(s): "PCO2", "PO2"  Studies/Results: No results found.  Anti-infectives: Anti-infectives (From admission, onward)    Start     Dose/Rate Route Frequency Ordered Stop   12/08/22 2200  cefTRIAXone (ROCEPHIN) 1 g in sodium chloride 0.9 % 100 mL IVPB        1 g 200 mL/hr over 30 Minutes Intravenous Every 24 hours 12/08/22 1143     12/07/22 2200  cefTRIAXone (ROCEPHIN) 1 g in sodium chloride 0.9 % 100 mL IVPB        1 g 200  mL/hr over 30 Minutes Intravenous  Once 12/07/22 2155 12/07/22 2312       Assessment/Plan: Nausea/vomiting Hiatal Hernia Gastric outlet obstruction   Pt better and stomach decompressed.  Will adv diet to fulls Will plan on avoiding surgery If possible.   FEN: adv FLD, if tol OK to ADAT ID: rocephin (UTI) VTE: okay for chemical prophylaxis from surgical standpoint     I reviewed provider notes,  last 24 h vitals and pain scores, last 48 h intake and output, last 24 h labs and trends, and last 24 h imaging results.  LOS: 3 days    Axel Filler 12/10/2022

## 2022-12-10 NOTE — Progress Notes (Addendum)
PROGRESS NOTE    Laura Cook  NWG:[id number] DOB: May 12, 1922 DOA: 12/07/2022 PCP: Kirstie Peri, MD   Brief Narrative:   89 y.o. female with medical history significant of essential hypertension, hyperlipidemia and GERD presented with intractable nausea, vomiting and abdominal pain.  On presentation, she was found to have markedly distended stomach and small bowel with suspicion for gastric outlet obstruction.  She was started on IV fluids.  GI and general surgery were consulted.  Palliative care was also consulted.  Assessment & Plan:   Intractable nausea and vomiting secondary to possible gastric outlet obstruction, possibly from hiatal hernia -Has been removed.  General surgery following.  Currently on clear liquid diet.  Diet advancement as per general surgery.  Continue IV Protonix.  Off IV fluids.  Continue antiemetic as needed.  Pain management.  GI has signed off.    Possible UTI: Present on admission -Continue Rocephin.  Follow urine cultures.  Right kidney mass concerning for renal cell carcinoma Goals of care -Imaging concerning for right superior pole renal cell carcinoma -Doubt that the patient will be a surgical candidate.  Communicated with Dr. McKenzie/urology via secure chat on 12/08/2022: Recommended outpatient follow-up with urology -Palliative care following: If patient deteriorates, she might be a candidate for hospice  Leukocytosis -Resolved  Thrombocytosis -Resolved  Hyponatremia -Mild.  Monitor.  Hypokalemia -Resolved  GERD -Continue PPI  Hyperlipidemia -Resume home regimen once able to tolerate orally  Essential hypertension -Monitor blood pressure.  Currently stable   DVT prophylaxis: SCDs Code Status: DNR.   Family Communication: Sons and granddaughter at bedside  disposition Plan: Status is: Inpatient Remains inpatient appropriate because: Of severity of illness    Consultants: GI/general surgery/palliative care.  Procedures:  None  Antimicrobials: Rocephin   Subjective: Patient seen and examined at bedside.  No shortness of breath, fever, chest pain reported.  Abdominal pain and vomiting are improving.  Tolerating clear liquid diet.   Objective: Vitals:   12/09/22 0436 12/09/22 1214 12/09/22 2133 12/10/22 0505  BP: (!) 158/61 (!) 141/65 139/62 (!) 156/65  Pulse: 75 69 (!) 56 61  Resp: 17 18 16 16   Temp: 98.5 F (36.9 C) 98.5 F (36.9 C) (!) 97.5 F (36.4 C) 98.1 F (36.7 C)  TempSrc: Oral  Oral Oral  SpO2: 97% 100% 99% 99%    Intake/Output Summary (Last 24 hours) at 12/10/2022 0827 Last data filed at 12/09/2022 1700 Gross per 24 hour  Intake 670 ml  Output 900 ml  Net -230 ml   There were no vitals filed for this visit.  Examination:  General: No acute distress.  Remains on room air.  Elderly female lying in bed.  Chronically ill and deconditioned looking. ENT/neck: NG has been removed.  No neck masses or elevated JVD noted respiratory: Bilateral decreased breath sounds at bases with scattered crackles CVS: My intermittent bradycardia present; S1-S2 heard Abdominal: Soft, distended mildly; nontender; no organomegaly, bowel sounds are heard normally Extremities: No clubbing; mild lower extremity edema present  CNS: Alert; still slow to respond and a poor historian.  Able to move extremities.   Lymph: No palpable lymphadenopathy noted  skin: No obvious petechiae/rashes  psych: Not agitated currently.  Affect is flat. Musculoskeletal: No obvious joint tenderness/erythema  Data Reviewed: I have personally reviewed following labs and imaging studies  CBC: Recent Labs  Lab 12/07/22 1707 12/08/22 0527 12/09/22 0523 12/10/22 0528  WBC 12.7* 10.5 9.4 7.4  NEUTROABS 11.0*  --  7.3 5.1  HGB 12.4 10.4*  9.4* 8.9*  HCT 37.3 32.4* 30.3* 28.5*  MCV 84.8 87.8 90.2 90.2  PLT 405* 338 290 276   Basic Metabolic Panel: Recent Labs  Lab 12/07/22 1707 12/08/22 0527 12/09/22 0523 12/10/22 0528   NA 141 134* 136 134*  K 3.3* 2.7* 3.2* 4.2  CL 89* 88* 95* 100  CO2 29 31 30 26   GLUCOSE 147* 176* 107* 95  BUN 30* 30* 24* 19  CREATININE 0.80 0.78 0.53 0.56  CALCIUM 10.2 8.9 8.9 8.6*  MG  --   --  2.0 1.8   GFR: Estimated Creatinine Clearance: 27.8 mL/min (by C-G formula based on SCr of 0.56 mg/dL). Liver Function Tests: Recent Labs  Lab 12/07/22 1707 12/08/22 0527 12/09/22 0523  AST 18 15 18   ALT 14 12 12   ALKPHOS 74 61 55  BILITOT 1.6* 0.9 0.4  PROT 7.6 6.2* 5.6*  ALBUMIN 4.4 3.7 3.2*   Recent Labs  Lab 12/07/22 1707  LIPASE 37   No results for input(s): "AMMONIA" in the last 168 hours. Coagulation Profile: Recent Labs  Lab 12/07/22 1707  INR 1.1   Cardiac Enzymes: No results for input(s): "CKTOTAL", "CKMB", "CKMBINDEX", "TROPONINI" in the last 168 hours. BNP (last 3 results) No results for input(s): "PROBNP" in the last 8760 hours. HbA1C: No results for input(s): "HGBA1C" in the last 72 hours. CBG: No results for input(s): "GLUCAP" in the last 168 hours. Lipid Profile: No results for input(s): "CHOL", "HDL", "LDLCALC", "TRIG", "CHOLHDL", "LDLDIRECT" in the last 72 hours. Thyroid Function Tests: No results for input(s): "TSH", "T4TOTAL", "FREET4", "T3FREE", "THYROIDAB" in the last 72 hours. Anemia Panel: No results for input(s): "VITAMINB12", "FOLATE", "FERRITIN", "TIBC", "IRON", "RETICCTPCT" in the last 72 hours. Sepsis Labs: No results for input(s): "PROCALCITON", "LATICACIDVEN" in the last 168 hours.  Recent Results (from the past 240 hour(s))  Urine Culture     Status: Abnormal   Collection Time: 12/05/22  4:24 PM   Specimen: Urine, Random  Result Value Ref Range Status   Specimen Description   Final    URINE, RANDOM Performed at [facility], [address], [place], [place] [address]    Special Requests   Final    NONE Reflexed from [id number] Performed at [facility], [address], [place], [place]  [address]    Culture (A)  Final    <10,000 COLONIES/mL INSIGNIFICANT GROWTH Performed at [facility], [address] [place], [place] [address]    Report Status 12/06/2022 FINAL  Final  Urine Culture     Status: None   Collection Time: 12/07/22  5:57 PM   Specimen: Urine, Catheterized  Result Value Ref Range Status   Specimen Description   Final    URINE, CATHETERIZED Performed at [facility], [address] [place], [place] [address]    Special Requests   Final    NONE Performed at [facility], [address], [place], [place] [address]    Culture   Final    NO GROWTH Performed at [facility], [address], [place], [place] [address]    Report Status 12/09/2022 FINAL  Final         Radiology Studies: No results found.      Scheduled Meds:  Chlorhexidine Gluconate Cloth  6 each Topical Daily   pantoprazole (PROTONIX) IV  40 mg Intravenous Q12H   Continuous Infusions:  cefTRIAXone (ROCEPHIN)  IV 1 g (12/09/22 2151)  Glade Lloyd, MD [facility] 12/10/2022, 8:27 AM

## 2022-12-10 NOTE — Plan of Care (Signed)
Plan of Care reviewed. 

## 2022-12-10 NOTE — Plan of Care (Signed)
Pt disoriented and tearful during the night. States she wants to go home and worries that her family does not know where she is. Pt has not slept throughout the shift. Problem: Education: Goal: Knowledge of General Education information will improve Description: Including pain rating scale, medication(s)/side effects and non-pharmacologic comfort measures Outcome: Progressing   Problem: Health Behavior/Discharge Planning: Goal: Ability to manage health-related needs will improve Outcome: Progressing   Problem: Clinical Measurements: Goal: Ability to maintain clinical measurements within normal limits will improve Outcome: Progressing Goal: Will remain free from infection Outcome: Progressing Goal: Diagnostic test results will improve Outcome: Progressing Goal: Respiratory complications will improve Outcome: Progressing Goal: Cardiovascular complication will be avoided Outcome: Progressing   Problem: Activity: Goal: Risk for activity intolerance will decrease Outcome: Progressing   Problem: Nutrition: Goal: Adequate nutrition will be maintained Outcome: Progressing   Problem: Coping: Goal: Level of anxiety will decrease Outcome: Progressing   Problem: Elimination: Goal: Will not experience complications related to bowel motility Outcome: Progressing Goal: Will not experience complications related to urinary retention Outcome: Progressing   Problem: Pain Managment: Goal: General experience of comfort will improve Outcome: Progressing   Problem: Safety: Goal: Ability to remain free from injury will improve Outcome: Progressing   Problem: Skin Integrity: Goal: Risk for impaired skin integrity will decrease Outcome: Progressing

## 2022-12-11 DIAGNOSIS — R112 Nausea with vomiting, unspecified: Secondary | ICD-10-CM | POA: Diagnosis not present

## 2022-12-11 LAB — CBC WITH DIFFERENTIAL/PLATELET
Abs Immature Granulocytes: 0.05 10*3/uL (ref 0.00–0.07)
Basophils Absolute: 0.1 10*3/uL (ref 0.0–0.1)
Basophils Relative: 1 %
Eosinophils Absolute: 0.3 10*3/uL (ref 0.0–0.5)
Eosinophils Relative: 4 %
HCT: 30.2 % — ABNORMAL LOW (ref 36.0–46.0)
Hemoglobin: 9.4 g/dL — ABNORMAL LOW (ref 12.0–15.0)
Immature Granulocytes: 1 %
Lymphocytes Relative: 14 %
Lymphs Abs: 1.1 10*3/uL (ref 0.7–4.0)
MCH: 27.6 pg (ref 26.0–34.0)
MCHC: 31.1 g/dL (ref 30.0–36.0)
MCV: 88.8 fL (ref 80.0–100.0)
Monocytes Absolute: 0.8 10*3/uL (ref 0.1–1.0)
Monocytes Relative: 10 %
Neutro Abs: 5.7 10*3/uL (ref 1.7–7.7)
Neutrophils Relative %: 70 %
Platelets: 287 10*3/uL (ref 150–400)
RBC: 3.4 MIL/uL — ABNORMAL LOW (ref 3.87–5.11)
RDW: 14.6 % (ref 11.5–15.5)
WBC: 8 10*3/uL (ref 4.0–10.5)
nRBC: 0 % (ref 0.0–0.2)

## 2022-12-11 LAB — BASIC METABOLIC PANEL
Anion gap: 8 (ref 5–15)
BUN: 19 mg/dL (ref 8–23)
CO2: 25 mmol/L (ref 22–32)
Calcium: 8.6 mg/dL — ABNORMAL LOW (ref 8.9–10.3)
Chloride: 98 mmol/L (ref 98–111)
Creatinine, Ser: 0.57 mg/dL (ref 0.44–1.00)
GFR, Estimated: >60 mL/min (ref >60)
Glucose, Bld: 104 mg/dL — ABNORMAL HIGH (ref 70–99)
Potassium: 4.1 mmol/L (ref 3.5–5.1)
Sodium: 131 mmol/L — ABNORMAL LOW (ref 135–145)

## 2022-12-11 LAB — MAGNESIUM: Magnesium: 1.8 mg/dL (ref 1.7–2.4)

## 2022-12-11 NOTE — Progress Notes (Signed)
Chaplain engaged in an initial visit with Laura Cook and her family. Chaplain offered reflective listening and built rapport as Laura Cook engaged in narrative life review. Laura Cook was born and raised in [place], [place]. She is the mother of four children and has a host of grandchildren. A lot of her family is in [place] but she lives with one of her sons, Laura Cook.   Laura Cook voiced that she didn't start experiencing any major health issues until after she retired. She also shared about feeling confused and forgetful a lot more now. She also spoke about her faith. She values her Bible and meditating on and hearing scriptures due.   Chaplain provided space to get to know Laura Cook and about her journey. Laura Cook would like continuous Chaplain support.    12/11/22 1200  Spiritual Encounters  Type of Visit Initial  Care provided to: Pt and family  Reason for visit Routine spiritual support  Spiritual Framework  Presenting Themes Impactful experiences and emotions;Community and relationships;Values and beliefs;Meaning/purpose/sources of inspiration  Values/beliefs Christian  Community/Connection Family  Strengths Community  Interventions  Spiritual Care Interventions Made Compassionate presence;Reflective listening;Established relationship of care and support;Narrative/life review;Explored values/beliefs/practices/strengths  Intervention Outcomes  Outcomes Reduced anxiety;Connection to spiritual care

## 2022-12-11 NOTE — TOC Initial Note (Signed)
Transition of Care [facility]) - Initial/Assessment Note    Patient Details  Name: Laura Cook MRN: [id number] Date of Birth: 1922-06-20  Transition of Care [facility]) CM/SW Contact:    Otelia Santee, LCSW Phone Number: 12/11/2022, 2:40 PM  Clinical Narrative:                 Met with pt in room to discuss recommendation for SNF placement. Pt is agreeable to placement and states she has been to SNF in [place] in the past and would like to return to the same facility if bed available.  Referrals have been faxed out and currently awaiting bed offers.   Expected Discharge Plan: Skilled Nursing Facility Barriers to Discharge: Continued Medical Work up   Patient Goals and CMS Choice Patient states their goals for this hospitalization and ongoing recovery are:: To go to SNF          Expected Discharge Plan and Services In-house Referral: NA Discharge Planning Services: NA Post Acute Care Choice: Skilled Nursing Facility Living arrangements for the past 2 months: Single Family Home                 DME Arranged: N/A DME Agency: NA                  Prior Living Arrangements/Services Living arrangements for the past 2 months: Single Family Home Lives with:: Self Patient language and need for interpreter reviewed:: Yes Do you feel safe going back to the place where you live?: Yes      Need for Family Participation in Patient Care: No (Comment) Care giver support system in place?: Yes (comment) Current home services: DME Gilmer Mor, RW, 3in1) Criminal Activity/Legal Involvement Pertinent to Current Situation/Hospitalization: No - Comment as needed  Activities of Daily Living      Permission Sought/Granted Permission sought to share information with : Facility Medical sales representative, Family Supports Permission granted to share information with : Yes, Verbal Permission Granted  Share Information with NAME: Terrika Seely  Permission granted to share info w AGENCY:  SNF's  Permission granted to share info w Relationship: Son  Permission granted to share info w Contact Information: [phone]  Emotional Assessment Appearance:: Appears younger than stated age Attitude/Demeanor/Rapport: Engaged Affect (typically observed): Accepting, Pleasant Orientation: : Oriented to Self, Oriented to Place, Oriented to  Time, Oriented to Situation Alcohol / Substance Use: Not Applicable Psych Involvement: No (comment)  Admission diagnosis:  Lower urinary tract infectious disease [N39.0] Generalized abdominal pain [R10.84] Intractable nausea and vomiting [R11.2] Nausea and vomiting, unspecified vomiting type [R11.2] Patient Active Problem List   Diagnosis Date Noted   Hypokalemia 12/07/2022   Leucocytosis 12/07/2022   Intractable nausea and vomiting 12/07/2022   Rt Hip fracture /S/p Fall 11/14/2020   HTN (hypertension) 11/14/2020   Hyponatremia--HCTZ induced 11/14/2020   GERD (gastroesophageal reflux disease) 11/14/2020   HLD (hyperlipidemia) 11/14/2020   PCP:  Kirstie Peri, MD Pharmacy:   [facility] [phone] [place], [place] - [address] [facility] [address] [place] [address] Phone: [phone] Fax: [phone]     Social Determinants of Health (SDOH) Social History: SDOH Screenings   Food Insecurity: No Food Insecurity (10/27/2021)   [facility] from [facility], [facility]  Transportation Needs: No Transportation Needs (10/27/2021)   Received from [facility], [facility]  Financial Resource Strain: Low Risk  (10/27/2021)   Received from [facility], [facility]  Tobacco Use: Low Risk  (12/08/2022)   SDOH Interventions:     Readmission Risk Interventions    12/08/2022    2:09 PM  Readmission Risk Prevention Plan  Post Dischage Appt Complete  Medication Screening Complete  Transportation Screening Complete

## 2022-12-11 NOTE — Progress Notes (Signed)
   Subjective/Chief Complaint: Pt tol PO at this point    Objective: Vital signs in last 24 hours: Temp:  [97.2 F (36.2 C)-98.7 F (37.1 C)] 98.4 F (36.9 C) (10/21 0621) Pulse Rate:  [65-74] 74 (10/21 0621) Resp:  [16] 16 (10/21 0621) BP: (134-163)/(55-74) 163/61 (10/21 0621) SpO2:  [100 %] 100 % (10/21 0621) Last BM Date : 12/11/22 (per report)  Intake/Output from previous day: 10/20 0701 - 10/21 0700 In: 200 [IV Piggyback:200] Out: 300 [Urine:300] Intake/Output this shift: No intake/output data recorded.  PE:  Constitutional: No acute distress, conversant, appears states age. Eyes: Anicteric sclerae, moist conjunctiva, no lid lag Lungs: Clear to auscultation bilaterally, normal respiratory effort CV: regular rate and rhythm, no murmurs, no peripheral edema, pedal pulses 2+ GI: Soft, no masses or hepatosplenomegaly, non-tender to palpation Skin: No rashes, palpation reveals normal turgor Psychiatric: appropriate judgment and insight, oriented to person, place, and time   Lab Results:  Recent Labs    12/10/22 0528 12/11/22 0521  WBC 7.4 8.0  HGB 8.9* 9.4*  HCT 28.5* 30.2*  PLT 276 287   BMET Recent Labs    12/10/22 0528 12/11/22 0521  NA 134* 131*  K 4.2 4.1  CL 100 98  CO2 26 25  GLUCOSE 95 104*  BUN 19 19  CREATININE 0.56 0.57  CALCIUM 8.6* 8.6*   PT/INR No results for input(s): "LABPROT", "INR" in the last 72 hours. ABG No results for input(s): "PHART", "HCO3" in the last 72 hours.  Invalid input(s): "PCO2", "PO2"  Studies/Results: No results found.  Anti-infectives: Anti-infectives (From admission, onward)    Start     Dose/Rate Route Frequency Ordered Stop   12/08/22 2200  cefTRIAXone (ROCEPHIN) 1 g in sodium chloride 0.9 % 100 mL IVPB        1 g 200 mL/hr over 30 Minutes Intravenous Every 24 hours 12/08/22 1143     12/07/22 2200  cefTRIAXone (ROCEPHIN) 1 g in sodium chloride 0.9 % 100 mL IVPB        1 g 200 mL/hr over 30 Minutes  Intravenous  Once 12/07/22 2155 12/07/22 2312       Assessment/Plan: Nausea/vomiting Hiatal Hernia Gastric outlet obstruction   Pt better and tol fulls.  OK to adv to soft.  OK for DC if she tolerated Will plan on avoiding surgery If possible. PLease call back if needed   FEN: adv soft, ID: rocephin (UTI) VTE: okay for chemical prophylaxis from surgical standpoint     I reviewed provider notes,  last 24 h vitals and pain scores, last 48 h intake and output, last 24 h labs and trends, and last 24 h imaging results.  LOS: 4 days    Axel Filler 12/11/2022

## 2022-12-11 NOTE — Evaluation (Signed)
Physical Therapy Evaluation Patient Details Name: Laura Cook MRN: [id number] DOB: 1923-01-21 Today's Date: 12/11/2022  History of Present Illness  Pt is a 89 y/o F admitted on 12/07/22 after presenting with c/o intractable N&V & abdominal pain. Pt found to have possible gastric outlet obstruction, possibly from hiatal hernia. Imaging also found R kidney mass concerning for renal cell carcinoma. PMH: essential HTN, HLD, GERD  Clinical Impression  Pt seen for PT evaluation with pt agreeable, family present. Prior to admission pt was ambulatory with RW, 1 fall in the past 6 months. Pt resides with her son Greggory Stallion but family present reports Greggory Stallion is unable to provide much, if any, physical assistance 2/2 his own medical issues. On this date, pt is oriented to year & self, follows simple instructions with extra time & is very pleasant throughout session. Pt is able to complete STS with supervision, ambulate short distance into hallway with RW & CGA, but requires mod assist for sit>supine. Recommend ongoing PT treatment to address strengthening, balance, endurance, & gait training to reduce fall risk & decrease caregiver burden.      If plan is discharge home, recommend the following: A little help with walking and/or transfers;A little help with bathing/dressing/bathroom;Assistance with cooking/housework;Direct supervision/assist for financial management;Assist for transportation;Supervision due to cognitive status;Help with stairs or ramp for entrance;Direct supervision/assist for medications management   Can travel by private vehicle   Yes    Equipment Recommendations None recommended by PT  Recommendations for Other Services       Functional Status Assessment Patient has had a recent decline in their functional status and demonstrates the ability to make significant improvements in function in a reasonable and predictable amount of time.     Precautions / Restrictions  Precautions Precautions: Fall Restrictions Weight Bearing Restrictions: No      Mobility  Bed Mobility Overal bed mobility: Needs Assistance Bed Mobility: Supine to Sit, Sit to Supine     Supine to sit: Supervision, HOB elevated, Used rails Sit to supine: Mod assist, Used rails, HOB elevated (assistance to elevate BLE onto bed; pt attempts to use UE to assist LE but PT has to assist)        Transfers Overall transfer level: Needs assistance Equipment used: Rolling walker (2 wheels) Transfers: Sit to/from Stand Sit to Stand: Supervision           General transfer comment: STS from EOB    Ambulation/Gait Ambulation/Gait assistance: Contact guard assist Gait Distance (Feet): 50 Feet Assistive device: Rolling walker (2 wheels) Gait Pattern/deviations: Step-to pattern, Decreased step length - right, Decreased step length - left, Decreased stride length, Decreased dorsiflexion - right, Decreased dorsiflexion - left Gait velocity: decreased     General Gait Details: cuing for obstacle avoidance on L (pt bumping into wall)  Stairs            Wheelchair Mobility     Tilt Bed    Modified Rankin (Stroke Patients Only)       Balance Overall balance assessment: Needs assistance Sitting-balance support: Feet supported Sitting balance-Leahy Scale: Fair     Standing balance support: During functional activity, Bilateral upper extremity supported, Reliant on assistive device for balance Standing balance-Leahy Scale: Fair                               Pertinent Vitals/Pain Pain Assessment Pain Assessment: Faces Faces Pain Scale: Hurts little more Pain Location: back Pain  Descriptors / Indicators: Sore Pain Intervention(s): Monitored during session    Home Living Family/patient expects to be discharged to:: Private residence Living Arrangements: Children Available Help at Discharge: Family Type of Home: House Home Access: Stairs to enter    [employer] of Steps: single threshold step   Home Layout: One level Home Equipment: Agricultural consultant (2 wheels);Other (comment) (lift chair)      Prior Function               Mobility Comments: Pt reports she's ambulatory with SPC, does have trouble getting into bed. Notes 1 fall in the past  6 months. ADLs Comments: Bathes & dresses as best she can, son drives them to go get food.     Extremity/Trunk Assessment   Upper Extremity Assessment Upper Extremity Assessment: Generalized weakness    Lower Extremity Assessment Lower Extremity Assessment: Generalized weakness       Communication   Communication Communication: Hearing impairment (pt also legally blind)  Cognition Arousal: Alert Behavior During Therapy: WFL for tasks assessed/performed Overall Cognitive Status: History of cognitive impairments - at baseline                                 General Comments: Pt is oriented to year & self, not oriented to month, name of hospital, nor situation leading to admission. Daughter in law reports pt's cognition is slightly worse compared to baseline since admission to hospital, but today is a good day. Pt follows simple commands with extra time during session.        General Comments      Exercises     Assessment/Plan    PT Assessment Patient needs continued PT services  PT Problem List Decreased strength;Pain;Decreased range of motion;Decreased activity tolerance;Decreased balance;Decreased mobility;Decreased safety awareness;Decreased knowledge of use of DME;Decreased cognition       PT Treatment Interventions DME instruction;Balance training;Modalities;Gait training;Neuromuscular re-education;Stair training;Cognitive remediation;Functional mobility training;Therapeutic activities;Therapeutic exercise;Manual techniques;Patient/family education    PT Goals (Current goals can be found in the Care Plan section)  Acute Rehab PT Goals Patient  Stated Goal: get better/stronger PT Goal Formulation: With patient/family Time For Goal Achievement: 12/25/22 Potential to Achieve Goals: Good    Frequency Min 1X/week     Co-evaluation               AM-PAC PT "6 Clicks" Mobility  Outcome Measure Help needed turning from your back to your side while in a flat bed without using bedrails?: None Help needed moving from lying on your back to sitting on the side of a flat bed without using bedrails?: A Little Help needed moving to and from a bed to a chair (including a wheelchair)?: A Little Help needed standing up from a chair using your arms (e.g., wheelchair or bedside chair)?: A Little Help needed to walk in hospital room?: A Little Help needed climbing 3-5 steps with a railing? : A Lot 6 Click Score: 18    End of Session   Activity Tolerance: Patient tolerated treatment well;Patient limited by fatigue Patient left: in bed;with call bell/phone within reach;with bed alarm set;with family/visitor present   PT Visit Diagnosis: Muscle weakness (generalized) (M62.81);Difficulty in walking, not elsewhere classified (R26.2)    Time: 9147-8295 PT Time Calculation (min) (ACUTE ONLY): 19 min   Charges:   PT Evaluation $PT Eval Low Complexity: 1 Low   PT General Charges $$ ACUTE PT VISIT: 1 Visit  Aleda Grana, PT, DPT 12/11/22, 11:03 AM   Sandi Mariscal 12/11/2022, 11:02 AM

## 2022-12-11 NOTE — [place] FL2 (Signed)
[place] MEDICAID FL2 LEVEL OF CARE FORM     IDENTIFICATION  Patient Name: Laura Cook Birthdate: 09-12-1922 Sex: female Admission Date (Current Location): 12/07/2022  [facility] and [place] Number:  Producer, television/film/video and Address:  [facility],  [address], [place] [address]      Provider Number: [id number]  Attending Physician Name and Address:  Glade Lloyd, MD  Relative Name and Phone Number:  Gi[place]ina, Molder [phone]    Current Level of Care: Hospital Recommended Level of Care: Skilled Nursing Facility Prior Approval Number:    Date Approved/Denied:   PASRR Number: [id number]  Discharge Plan: SNF    Current Diagnoses: Patient Active Problem List   Diagnosis Date Noted   Hypokalemia 12/07/2022   Leucocytosis 12/07/2022   Intractable nausea and vomiting 12/07/2022   Rt Hip fracture /S/p Fall 11/14/2020   HTN (hypertension) 11/14/2020   Hyponatremia--HCTZ induced 11/14/2020   GERD (gastroesophageal reflux disease) 11/14/2020   HLD (hyperlipidemia) 11/14/2020    Orientation RESPIRATION BLADDER Height & Weight     Self, Time, Situation, Place  Normal Incontinent Weight:   Height:     BEHAVIORAL SYMPTOMS/MOOD NEUROLOGICAL BOWEL NUTRITION STATUS      Continent Diet (Regular)  AMBULATORY STATUS COMMUNICATION OF NEEDS Skin   Limited Assist Verbally Normal                       Personal Care Assistance Level of Assistance  Bathing, Feeding, Dressing Bathing Assistance: Limited assistance Feeding assistance: Independent Dressing Assistance: Limited assistance     Functional Limitations Info  Sight, Hearing, Speech Sight Info: Impaired Hearing Info: Impaired Speech Info: Adequate    SPECIAL CARE FACTORS FREQUENCY  PT (By licensed PT), OT (By licensed OT)     PT Frequency: 5x/wk OT Frequency: 5x/wk            Contractures Contractures Info: Not present    Additional Factors Info  Code Status, Allergies  Code Status Info: DNR Allergies Info: Tape           Current Medications (12/11/2022):  This is the current hospital active medication list Current Facility-Administered Medications  Medication Dose Route Frequency Provider Last Rate Last Admin   acetaminophen (TYLENOL) 160 MG/5ML solution 650 mg  650 mg Oral Q6H PRN Glade Lloyd, MD   650 mg at 12/09/22 2141   cefTRIAXone (ROCEPHIN) 1 g in sodium chloride 0.9 % 100 mL IVPB  1 g Intravenous Q24H Glade Lloyd, MD   Stopped at 12/10/22 2206   Chlorhexidine Gluconate Cloth 2 % PADS 6 each  6 each Topical Daily Glade Lloyd, MD   6 each at 12/11/22 0928   haloperidol lactate (HALDOL) injection 1 mg  1 mg Intravenous Q6H PRN Glade Lloyd, MD   1 mg at 12/10/22 1834   melatonin tablet 5 mg  5 mg Oral QHS PRN Anthoney Harada, NP   5 mg at 12/10/22 0048   ondansetron (ZOFRAN) tablet 4 mg  4 mg Oral Q6H PRN Rometta Emery, MD       Or   ondansetron (ZOFRAN) injection 4 mg  4 mg Intravenous Q6H PRN Rometta Emery, MD       pantoprazole (PROTONIX) injection 40 mg  40 mg Intravenous Q12H Glade Lloyd, MD   40 mg at 12/11/22 0272     Discharge Medications: Please see discharge summary for a list of discharge medications.  Relevant Imaging Results:  Relevant Lab Results:  Additional Information SSN: [id number]  Otelia Santee, LCSW

## 2022-12-11 NOTE — Progress Notes (Signed)
PROGRESS NOTE    Laura Cook  ZOX:[id number] DOB: October 29, 1922 DOA: 12/07/2022 PCP: Kirstie Peri, MD   Brief Narrative:   89 y.o. female with medical history significant of essential hypertension, hyperlipidemia and GERD presented with intractable nausea, vomiting and abdominal pain.  On presentation, she was found to have markedly distended stomach and small bowel with suspicion for gastric outlet obstruction.  She was started on IV fluids.  GI and general surgery were consulted.  Palliative care was also consulted.  Assessment & Plan:   Intractable nausea and vomiting secondary to possible gastric outlet obstruction, possibly from hiatal hernia -NG tube has already been removed.  General surgery following.  Currently on full liquid diet.  Diet advancement as per general surgery.  Continue IV Protonix.  Off IV fluids.  Continue antiemetic as needed.  Pain management.  GI has signed off.    Possible UTI: Present on admission -Continue Rocephin for 5 days.  Urine culture negative so far.  Right kidney mass concerning for renal cell carcinoma Goals of care -Imaging concerning for right superior pole renal cell carcinoma -Doubt that the patient will be a surgical candidate.  Communicated with Dr. McKenzie/urology via secure chat on 12/08/2022: Recommended outpatient follow-up with urology -Palliative care following: If patient deteriorates, she might be a candidate for hospice  Acute urinary retention Pelvic floor laxity -Has required Foley catheter since admission.  Might have to go home with Foley catheter and outpatient follow-up with urology.  Start Flomax.  Normocytic anemia -Possibly from anemia of chronic disease.  Hemoglobin currently stable.  Monitor intermittently  Delirium/acute metabolic encephalopathy -Patient probably has undiagnosed underlying dementia.  Has had intermittent confusion and agitation requiring as needed Haldol.  Delirium precautions.  Fall precautions.   PT eval pending.  Leukocytosis -Resolved  Thrombocytosis -Resolved  Hyponatremia -Sodium 131 today.  Monitor.  Encourage oral intake.  Hypokalemia -Resolved  GERD -Continue PPI  Hyperlipidemia -Resume home regimen once able to solid food  Essential hypertension -Monitor blood pressure.  Currently stable   DVT prophylaxis: SCDs Code Status: DNR.   Family Communication: Son and granddaughter at bedside  disposition Plan: Status is: Inpatient Remains inpatient appropriate because: Of severity of illness    Consultants: GI/general surgery/palliative care.  Procedures: None  Antimicrobials: Rocephin   Subjective: Patient seen and examined at bedside.  Poor historian.  No fever, vomiting, seizures reported.  Nursing staff reported increasing confusion and agitation yesterday requiring Haldol Objective: Vitals:   12/10/22 0505 12/10/22 1507 12/10/22 2005 12/11/22 0621  BP: (!) 156/65 (!) 154/74 (!) 134/55 (!) 163/61  Pulse: 61 65 67 74  Resp: 16  16 16   Temp: 98.1 F (36.7 C) 98.7 F (37.1 C) (!) 97.2 F (36.2 C) 98.4 F (36.9 C)  TempSrc: Oral Oral  Oral  SpO2: 99% 100% 100% 100%    Intake/Output Summary (Last 24 hours) at 12/11/2022 8119 Last data filed at 12/11/2022 1478 Gross per 24 hour  Intake 200 ml  Output 300 ml  Net -100 ml   There were no vitals filed for this visit.  Examination:  General: On room air currently.  No distress.  Elderly female sitting in chair.  Chronically ill and deconditioned looking. ENT/neck: No palpable thyromegaly or JVD elevation noted  respiratory: Decreased breath sounds at bases bilaterally with some crackles  CVS: S1 and S2 are heard; currently rate controlled Abdominal: Soft, slightly distended; nontender; no organomegaly, normal bowel sounds heard  extremities: Trace lower extremity edema present; no cyanosis  CNS: Awake; answers some questions, slow to respond.  Able to move extremities.   Lymph: No cervical  lymphadenopathy noted  skin: No obvious lesions/ecchymosis  psych: Extremely flat affect.  Not agitated.   Musculoskeletal: No obvious joint deformity/tenderness  Data Reviewed: I have personally reviewed following labs and imaging studies  CBC: Recent Labs  Lab 12/07/22 1707 12/08/22 0527 12/09/22 0523 12/10/22 0528 12/11/22 0521  WBC 12.7* 10.5 9.4 7.4 8.0  NEUTROABS 11.0*  --  7.3 5.1 5.7  HGB 12.4 10.4* 9.4* 8.9* 9.4*  HCT 37.3 32.4* 30.3* 28.5* 30.2*  MCV 84.8 87.8 90.2 90.2 88.8  PLT 405* 338 290 276 287   Basic Metabolic Panel: Recent Labs  Lab 12/07/22 1707 12/08/22 0527 12/09/22 0523 12/10/22 0528 12/11/22 0521  NA 141 134* 136 134* 131*  K 3.3* 2.7* 3.2* 4.2 4.1  CL 89* 88* 95* 100 98  CO2 29 31 30 26 25   GLUCOSE 147* 176* 107* 95 104*  BUN 30* 30* 24* 19 19  CREATININE 0.80 0.78 0.53 0.56 0.57  CALCIUM 10.2 8.9 8.9 8.6* 8.6*  MG  --   --  2.0 1.8 1.8   GFR: Estimated Creatinine Clearance: 27.8 mL/min (by C-G formula based on SCr of 0.57 mg/dL). Liver Function Tests: Recent Labs  Lab 12/07/22 1707 12/08/22 0527 12/09/22 0523  AST 18 15 18   ALT 14 12 12   ALKPHOS 74 61 55  BILITOT 1.6* 0.9 0.4  PROT 7.6 6.2* 5.6*  ALBUMIN 4.4 3.7 3.2*   Recent Labs  Lab 12/07/22 1707  LIPASE 37   No results for input(s): "AMMONIA" in the last 168 hours. Coagulation Profile: Recent Labs  Lab 12/07/22 1707  INR 1.1   Cardiac Enzymes: No results for input(s): "CKTOTAL", "CKMB", "CKMBINDEX", "TROPONINI" in the last 168 hours. BNP (last 3 results) No results for input(s): "PROBNP" in the last 8760 hours. HbA1C: No results for input(s): "HGBA1C" in the last 72 hours. CBG: No results for input(s): "GLUCAP" in the last 168 hours. Lipid Profile: No results for input(s): "CHOL", "HDL", "LDLCALC", "TRIG", "CHOLHDL", "LDLDIRECT" in the last 72 hours. Thyroid Function Tests: No results for input(s): "TSH", "T4TOTAL", "FREET4", "T3FREE", "THYROIDAB" in the last  72 hours. Anemia Panel: No results for input(s): "VITAMINB12", "FOLATE", "FERRITIN", "TIBC", "IRON", "RETICCTPCT" in the last 72 hours. Sepsis Labs: No results for input(s): "PROCALCITON", "LATICACIDVEN" in the last 168 hours.  Recent Results (from the past 240 hour(s))  Urine Culture     Status: Abnormal   Collection Time: 12/05/22  4:24 PM   Specimen: Urine, Random  Result Value Ref Range Status   Specimen Description   Final    URINE, RANDOM Performed at [facility], [address], [place], [place] [address]    Special Requests   Final    NONE Reflexed from [phone] Performed at [facility], [address] [place], [place] [address]    Culture (A)  Final    <10,000 COLONIES/mL INSIGNIFICANT GROWTH Performed at [facility], [address], [place], [place] [address]    Report Status 12/06/2022 FINAL  Final  Urine Culture     Status: None   Collection Time: 12/07/22  5:57 PM   Specimen: Urine, Catheterized  Result Value Ref Range Status   Specimen Description   Final    URINE, CATHETERIZED Performed at [facility], [address], [place], [place] [address]    Special Requests   Final  NONE Performed at [facility], [address] [place], [place] [address]    Culture   Final    NO GROWTH Performed at [facility], [address] [place], [place] [address]    Report Status 12/09/2022 FINAL  Final         Radiology Studies: No results found.      Scheduled Meds:  Chlorhexidine Gluconate Cloth  6 each Topical Daily   pantoprazole (PROTONIX) IV  40 mg Intravenous Q12H   Continuous Infusions:  cefTRIAXone (ROCEPHIN)  IV Stopped (12/10/22 2206)          Glade Lloyd, MD [facility] 12/11/2022, 8:08 AM

## 2022-12-12 DIAGNOSIS — Z515 Encounter for palliative care: Secondary | ICD-10-CM | POA: Diagnosis not present

## 2022-12-12 DIAGNOSIS — Z66 Do not resuscitate: Secondary | ICD-10-CM | POA: Diagnosis not present

## 2022-12-12 DIAGNOSIS — Z7189 Other specified counseling: Secondary | ICD-10-CM | POA: Diagnosis not present

## 2022-12-12 DIAGNOSIS — R112 Nausea with vomiting, unspecified: Secondary | ICD-10-CM | POA: Diagnosis not present

## 2022-12-12 LAB — CBC WITH DIFFERENTIAL/PLATELET
Abs Immature Granulocytes: 0.06 10*3/uL (ref 0.00–0.07)
Basophils Absolute: 0.1 10*3/uL (ref 0.0–0.1)
Basophils Relative: 1 %
Eosinophils Absolute: 0.3 10*3/uL (ref 0.0–0.5)
Eosinophils Relative: 5 %
HCT: 30.9 % — ABNORMAL LOW (ref 36.0–46.0)
Hemoglobin: 9.5 g/dL — ABNORMAL LOW (ref 12.0–15.0)
Immature Granulocytes: 1 %
Lymphocytes Relative: 20 %
Lymphs Abs: 1.2 10*3/uL (ref 0.7–4.0)
MCH: 28 pg (ref 26.0–34.0)
MCHC: 30.7 g/dL (ref 30.0–36.0)
MCV: 91.2 fL (ref 80.0–100.0)
Monocytes Absolute: 0.6 10*3/uL (ref 0.1–1.0)
Monocytes Relative: 9 %
Neutro Abs: 3.8 10*3/uL (ref 1.7–7.7)
Neutrophils Relative %: 64 %
Platelets: 266 10*3/uL (ref 150–400)
RBC: 3.39 MIL/uL — ABNORMAL LOW (ref 3.87–5.11)
RDW: 14.6 % (ref 11.5–15.5)
WBC: 6 10*3/uL (ref 4.0–10.5)
nRBC: 0 % (ref 0.0–0.2)

## 2022-12-12 LAB — MAGNESIUM: Magnesium: 1.7 mg/dL (ref 1.7–2.4)

## 2022-12-12 LAB — BASIC METABOLIC PANEL
Anion gap: 8 (ref 5–15)
BUN: 13 mg/dL (ref 8–23)
CO2: 23 mmol/L (ref 22–32)
Calcium: 8.6 mg/dL — ABNORMAL LOW (ref 8.9–10.3)
Chloride: 100 mmol/L (ref 98–111)
Creatinine, Ser: 0.51 mg/dL (ref 0.44–1.00)
GFR, Estimated: >60 mL/min (ref >60)
Glucose, Bld: 93 mg/dL (ref 70–99)
Potassium: 3.7 mmol/L (ref 3.5–5.1)
Sodium: 131 mmol/L — ABNORMAL LOW (ref 135–145)

## 2022-12-12 NOTE — TOC Progression Note (Addendum)
Transition of Care [facility]) - Progression Note    Patient Details  Name: Laura Cook MRN: [id number] Date of Birth: Jul 13, 1922  [facility] CM/SW Contact  Otelia Santee, LCSW Phone Number: 12/12/2022, 11:27 AM  Clinical Narrative:    Met with pt to review bed offers for SNF. Pt has accepted bed offer for [facility]. Pt currently medically stable. Per Aaron Edelman they can admit pt to their facility tomorrow pending insurance auth.  VM left for [employer]. Will initiate insurance auth once call returned.   ADDENDUM: Insurance Berkley Harvey has been requested through [employer].   Expected Discharge Plan: Skilled Nursing Facility Barriers to Discharge: Continued Medical Work up  Expected Discharge Plan and Services In-house Referral: NA Discharge Planning Services: NA Post Acute Care Choice: Skilled Nursing Facility Living arrangements for the past 2 months: Single Family Home                 DME Arranged: N/A DME Agency: NA                   Social Determinants of Health (SDOH) Interventions SDOH Screenings   Food Insecurity: No Food Insecurity (10/27/2021)   [facility], [facility]  Transportation Needs: No Transportation Needs (10/27/2021)   [facility], [facility]  Financial Resource Strain: Low Risk  (10/27/2021)   [facility]  Tobacco Use: Low Risk  (12/08/2022)    Readmission Risk Interventions    12/08/2022    2:09 PM  Readmission Risk Prevention Plan  Post Dischage Appt Complete  Medication Screening Complete  Transportation Screening Complete

## 2022-12-12 NOTE — TOC CM/SW Note (Signed)
CMS list of facilities and star ratings provided to pt to review for facility preference.       [facility] [address] [place], [place] [address] [phone] Overall rating ??  Below average  [facility] at the [facility] [address] [place], [place] [address] [phone] Overall rating ?? Below average  [facility] [address] [place], [place] [address] [phone] Overall rating?? Below average  [facility] [address] [place], [place] [address] [phone] Overall rating ? Much below average  [facility] [address] [place], [place] [address] [phone] Overall rating ??? Average  [facility] and [facility] [address] [place], [place] [address] [phone] Overall rating ? Much below average   [facility] [address] [place], [place] [address] [phone] Overall rating ? Much below average  [employer] [facility] [employer] [address] [place], [place] [address] [phone] Overall rating ??? Average  [facility] [address] [place], [place] [address] [phone] Overall rating ? Much below average  [facility] [address] [place], [place] [address] [phone] Overall rating ? Much below average  [facility] [address] [place], [place] [address] [phone] Overall rating ???? Above average  [facility] [address] [place], [place] [address] [phone] Overall rating ????? Much above average  [facility] [address] [place], [place] [address] [phone] Overall rating ???? Above average  [facility] [address] [place], [place]  [address] [phone] Overall rating ????? Much above average  [facility] [address] [place], [place] [address] [phone] Overall rating ????  [facility] [address] [place], [place] [address] [phone] Overall rating ????? Much above average  [place] [facility] [address] [place], [place] [address] [phone] Overall rating ????? Much above average  [facility] [address] [place], [place] [address] [phone] Overall rating ? Much below average  [facility] [address] [place], [place] [address] [phone] Overall rating ??? Average  [facility] [address] [place], [place] [address] [phone] Overall rating ????? Much above average  [facility] [address] [place], [place] [address] [phone] Overall rating ??? Average  [facility] [address] [place], [place] [address] [phone] Overall rating ?? Below average  [facility] [address] [place], [place] [address] [phone] Overall rating ??? Average  [facility] [address] [place], [place] [address] [phone] Overall rating ????? Much above average  [facility] [address] [place], [place] [address] [phone] Overall rating ? Much below average  [facility]	[place] at the [facility] at [facility], [place] [address] [phone] Overall rating ????? Much above average  [facility] [address] [place], [place] [address] [phone] Overall rating ? Much below average  [facility] [address] [place], [place] [address] [phone] Overall rating ??? Average  [facility]  [facility] [address] [place], [place] [address] [phone] Overall rating ??  Below average  [facility] [address] [place], [place] [address] [phone] Overall rating ? Much below average  [facility] [facility] [address] [place], [place] [address] [phone] Overall rating ??? Average  [facility] [address] [place], [place] [address] [phone] Overall rating ? Much below average  [facility] [address] [place], [place] [address] [phone] Overall rating ??? Average  [facility] [address] [place], [place] [address] [phone] Overall rating ? Much below average  [place] [address] [place], [place] [address] [phone] Overall rating ????? Much above average  [facility] [address] [place], [place] [address] [phone] Overall rating ? Much below average  [facility] [address] [place], [place] [address] [phone] Overall rating ???? Above average  [facility] [address] [place], [place] [address] [phone] Overall rating ????? Above average  [facility] [address] [place], [place] [address] [phone] Overall rating ???? Above average  [facility] [address] [place], [place] [address] [phone] Overall rating ????? Much above average  [facility] [address] [place], [place] [address] [phone] Overall rating ? Much below average  [facility] [address] [place], [place] [address] [phone] Overall rating ????? Much above average  [facility] [address] [place], [place] [address] [phone] Overall rating ? Much below  average  [facility] [address] [place], [place] [address] [phone] Overall rating ????? Much above average  [facility] [address] [place], [place] [address] [phone] Overall rating ????? Much above average  [facility] [address] [place], [place] [address] [phone] Overall rating ??? Average  [facility] [address] [place], [place] [address] [phone] Overall rating ??? Average  [facility] [address] [place], [place] [address] [phone] Overall rating ???? Above average  [facility] [address] [place], [place] [address] [phone] Overall rating ?? Below average  [facility] [address] [place], [place] [address] [phone] Overall rating ???? Above average  [facility] [address] [place], [place] [address] [phone] Overall rating ?? Below average  [facility] [address] [place], [place] [address] [phone] Overall rating ??

## 2022-12-12 NOTE — Progress Notes (Signed)
PROGRESS NOTE    Laura Cook  GMW:[id number] DOB: 04/07/1922 DOA: 12/07/2022 PCP: Kirstie Peri, MD   Brief Narrative:   89 y.o. female with medical history significant of essential hypertension, hyperlipidemia and GERD presented with intractable nausea, vomiting and abdominal pain.  On presentation, she was found to have markedly distended stomach and small bowel with suspicion for gastric outlet obstruction.  She was started on IV fluids.  GI and general surgery were consulted.  Palliative care was also consulted.  Patient managed conservatively and diet has been advanced by general surgery: General Surgery signed off on 12/11/2022.  PT recommending SNF placement.  Assessment & Plan:   Intractable nausea and vomiting secondary to possible gastric outlet obstruction, possibly from hiatal hernia -NG tube has already been removed.  General surgery has already advanced diet and signed off on 12/11/2022.  Continue Protonix.  Off IV fluids.  Continue antiemetic as needed.  Pain management.  GI has signed off.    Possible UTI: Present on admission -Has received Rocephin for 5 days.  DC antibiotics.  Urine culture negative so far.  Right kidney mass concerning for renal cell carcinoma Goals of care -Imaging concerning for right superior pole renal cell carcinoma -Doubt that the patient will be a surgical candidate.  Communicated with Dr. McKenzie/urology via secure chat on 12/08/2022: Recommended outpatient follow-up with urology -Palliative care following: If patient deteriorates, she might be a candidate for hospice  Acute urinary retention Pelvic floor laxity -Has required Foley catheter since admission.  Might have to go home with Foley catheter and outpatient follow-up with urology.  Start Flomax.  Normocytic anemia -Possibly from anemia of chronic disease.  Hemoglobin currently stable.  Monitor intermittently  Delirium/acute metabolic encephalopathy -Patient probably has  undiagnosed underlying dementia.  Has had intermittent confusion and agitation requiring as needed Haldol.  Delirium precautions.  Fall precautions.  PT recommends SNF placement.  TOC consulted.  Leukocytosis -Resolved  Thrombocytosis -Resolved  Hyponatremia -Sodium 131 today as well.  Monitor.  Encourage oral intake.  Hypokalemia -Resolved  GERD -Continue PPI  Hyperlipidemia -Resume home regimen on discharge  Essential hypertension -Monitor blood pressure.  Currently stable   DVT prophylaxis: SCDs Code Status: DNR.   Family Communication: Son and granddaughter at bedside  disposition Plan: Status is: Inpatient Remains inpatient appropriate because: Of severity of illness.  Currently medically stable for discharge to SNF    Consultants: GI/general surgery/palliative care.  Procedures: None  Antimicrobials: Rocephin   Subjective: Patient seen and examined at bedside.  Poor historian.  No agitation, fever, vomiting reported.  Tolerating diet. Objective: Vitals:   12/11/22 0621 12/11/22 1220 12/11/22 1933 12/12/22 0534  BP: (!) 163/61 (!) 157/62 (!) 141/68 (!) 150/60  Pulse: 74 69 66 70  Resp: 16 17 20 17   Temp: 98.4 F (36.9 C) 98 F (36.7 C) 98.3 F (36.8 C) 97.7 F (36.5 C)  TempSrc: Oral Oral Oral Oral  SpO2: 100% 98% 98% 96%    Intake/Output Summary (Last 24 hours) at 12/12/2022 0827 Last data filed at 12/11/2022 1600 Gross per 24 hour  Intake 120 ml  Output 200 ml  Net -80 ml   There were no vitals filed for this visit.  Examination:  General: On room air.  No acute distress.  Elderly female sitting in chair.  Chronically ill and deconditioned looking.  Slow to respond.  Hard of hearing.  Poor historian.  Flat affect. respiratory: Bilateral decreased breath sounds at bases bilaterally with scattered crackles CVS:  Rate mostly controlled; S1-S2 heard  abdominal: Soft, nontender, distended but, no organomegaly; bowel sounds are heard   extremities: No clubbing; mild lower extremity edema present   Data Reviewed: I have personally reviewed following labs and imaging studies  CBC: Recent Labs  Lab 12/07/22 1707 12/08/22 0527 12/09/22 0523 12/10/22 0528 12/11/22 0521 12/12/22 0615  WBC 12.7* 10.5 9.4 7.4 8.0 6.0  NEUTROABS 11.0*  --  7.3 5.1 5.7 3.8  HGB 12.4 10.4* 9.4* 8.9* 9.4* 9.5*  HCT 37.3 32.4* 30.3* 28.5* 30.2* 30.9*  MCV 84.8 87.8 90.2 90.2 88.8 91.2  PLT 405* 338 290 276 287 266   Basic Metabolic Panel: Recent Labs  Lab 12/08/22 0527 12/09/22 0523 12/10/22 0528 12/11/22 0521 12/12/22 0615  NA 134* 136 134* 131* 131*  K 2.7* 3.2* 4.2 4.1 3.7  CL 88* 95* 100 98 100  CO2 31 30 26 25 23   GLUCOSE 176* 107* 95 104* 93  BUN 30* 24* 19 19 13   CREATININE 0.78 0.53 0.56 0.57 0.51  CALCIUM 8.9 8.9 8.6* 8.6* 8.6*  MG  --  2.0 1.8 1.8 1.7   GFR: Estimated Creatinine Clearance: 27.8 mL/min (by C-G formula based on SCr of 0.51 mg/dL). Liver Function Tests: Recent Labs  Lab 12/07/22 1707 12/08/22 0527 12/09/22 0523  AST 18 15 18   ALT 14 12 12   ALKPHOS 74 61 55  BILITOT 1.6* 0.9 0.4  PROT 7.6 6.2* 5.6*  ALBUMIN 4.4 3.7 3.2*   Recent Labs  Lab 12/07/22 1707  LIPASE 37   No results for input(s): "AMMONIA" in the last 168 hours. Coagulation Profile: Recent Labs  Lab 12/07/22 1707  INR 1.1   Cardiac Enzymes: No results for input(s): "CKTOTAL", "CKMB", "CKMBINDEX", "TROPONINI" in the last 168 hours. BNP (last 3 results) No results for input(s): "PROBNP" in the last 8760 hours. HbA1C: No results for input(s): "HGBA1C" in the last 72 hours. CBG: No results for input(s): "GLUCAP" in the last 168 hours. Lipid Profile: No results for input(s): "CHOL", "HDL", "LDLCALC", "TRIG", "CHOLHDL", "LDLDIRECT" in the last 72 hours. Thyroid Function Tests: No results for input(s): "TSH", "T4TOTAL", "FREET4", "T3FREE", "THYROIDAB" in the last 72 hours. Anemia Panel: No results for input(s):  "VITAMINB12", "FOLATE", "FERRITIN", "TIBC", "IRON", "RETICCTPCT" in the last 72 hours. Sepsis Labs: No results for input(s): "PROCALCITON", "LATICACIDVEN" in the last 168 hours.  Recent Results (from the past 240 hour(s))  Urine Culture     Status: Abnormal   Collection Time: 12/05/22  4:24 PM   Specimen: Urine, Random  Result Value Ref Range Status   Specimen Description   Final    URINE, RANDOM Performed at [facility], [address] [place], [place] [address]    Special Requests   Final    NONE Reflexed from [id number] Performed at [facility], [address] [place], [place] [address]    Culture (A)  Final    <10,000 COLONIES/mL INSIGNIFICANT GROWTH Performed at [facility], [address] [place], [place] [address]    Report Status 12/06/2022 FINAL  Final  Urine Culture     Status: None   Collection Time: 12/07/22  5:57 PM   Specimen: Urine, Catheterized  Result Value Ref Range Status   Specimen Description   Final    URINE, CATHETERIZED Performed at [facility], [address], [place], [place] [address]    Special Requests   Final    NONE Performed at [facility],  [address] [place], [place] [address]    Culture   Final    NO GROWTH Performed at [facility], [address] [place], [place] [address]    Report Status 12/09/2022 FINAL  Final         Radiology Studies: No results found.      Scheduled Meds:  Chlorhexidine Gluconate Cloth  6 each Topical Daily   pantoprazole (PROTONIX) IV  40 mg Intravenous Q12H   Continuous Infusions:  cefTRIAXone (ROCEPHIN)  IV 1 g (12/11/22 2107)          Glade Lloyd, MD [facility] 12/12/2022, 8:27 AM

## 2022-12-12 NOTE — Plan of Care (Signed)
  Problem: Education: Goal: Knowledge of General Education information will improve Description Including pain rating scale, medication(s)/side effects and non-pharmacologic comfort measures Outcome: Progressing   Problem: Clinical Measurements: Goal: Ability to maintain clinical measurements within normal limits will improve Outcome: Progressing Goal: Will remain free from infection Outcome: Progressing Goal: Diagnostic test results will improve Outcome: Progressing   Problem: Pain Managment: Goal: General experience of comfort will improve Outcome: Progressing   Problem: Safety: Goal: Ability to remain free from injury will improve Outcome: Progressing   

## 2022-12-12 NOTE — Progress Notes (Signed)
Mobility Specialist - Progress Note   12/12/22 0921  Mobility  Activity Ambulated with assistance in hallway  Level of Assistance Contact guard assist, steadying assist  Assistive Device Front wheel walker  Distance Ambulated (ft) 110 ft  Range of Motion/Exercises Active  Activity Response Tolerated well  Mobility Referral Yes  $Mobility charge 1 Mobility  Mobility Specialist Start Time (ACUTE ONLY) [id number]  Mobility Specialist Stop Time (ACUTE ONLY) 0902  Mobility Specialist Time Calculation (min) (ACUTE ONLY) 11 min   Pt received in chair and agreed to mobility, had c/o fatigue throughout session. Returned to chair with all needs met.  Marilynne Halsted Mobility Specialist

## 2022-12-12 NOTE — Progress Notes (Signed)
Daily Progress Note   Patient Name: Laura Cook       Date: 12/12/2022 DOB: 06/21/1922  Age: 89 y.o. MRN#: [id number] Attending Physician: Glade Lloyd, MD Primary Care Physician: Kirstie Peri, MD Admit Date: 12/07/2022 Length of Stay: 5 days  Reason for Consultation/Follow-up: Establishing goals of care  HPI/Patient Profile:  89 y.o. female  with past medical history of essential hypertension, hyperlipidemia and GERD presented with intractable nausea, vomiting and abdominal pain. She was admitted on 12/07/2022 with intractable nausea and vomiting secondary to GOO, possible UTI, right kidney mass concerning for RCC, and others.    PMT was consulted for GOC conversations.  Subjective:   Subjective: Chart Reviewed. Updates received. Patient Assessed. Created space and opportunity for patient  and family to explore thoughts and feelings regarding current medical situation.  Today's Discussion: Today I saw the patient at bedside, her son Laura Cook is present.  She was sitting in the bedside chair, but appeared near getting back in bed with the nurse tech.  We celebrated that her gastric outlet obstruction appears resolved, she is on a more normal diet and does not have the NG tube anymore.  However, today she states she feels very weak and tired and not like herself.  We explained that after significant illness that is common to feel not like yourself as well as weak and tired.  We expressed hope that she will get her strength back in the coming days and weeks as she recovers.  She also complains that she does not like the gown it is too big on her.  Today she admits shortness of breath that is chronic for her and no worse than normal.  She denies nausea and vomiting.  She again states that she is glad the tube is out.  She denies any further complaints at this time.  We discussed plan for discharge when she is medically stable, which she is heading in that direction given that her outlet  obstruction is resolved.  Discussed likely need for SNF/rehab to get her strength back given her significant illness.  Patient and son verbalized understanding.  Finally, she noted that her other children have left to go back to [place] and she feels all alone.  I reminded her that her son Laura Cook is here with her and that her other children may have had to return to their own families.  She still seems a bit sad about this.  I provided emotional and general support through therapeutic listening, empathy, sharing of stories, therapeutic touch, and other techniques. I answered all questions and addressed all concerns to the best of my ability.  Review of Systems  Constitutional:  Positive for fatigue.  Respiratory:  Positive for shortness of breath (Chronic, intermittent; no worse than baseline).   Cardiovascular:  Negative for chest pain.  Gastrointestinal:  Negative for nausea and vomiting. Abdominal pain: mild. Neurological:  Positive for weakness.    Objective:   Vital Signs:  BP (!) 150/60 (BP Location: Right Arm)   Pulse 70   Temp 97.7 F (36.5 C) (Oral)   Resp 17   SpO2 96%   Physical Exam: Physical Exam Vitals and nursing note reviewed.  Constitutional:      General: She is not in acute distress.    Appearance: She is not toxic-appearing.     Comments: Appears frail and weak/tired  Cardiovascular:     Rate and Rhythm: Normal rate.  Pulmonary:     Effort: Pulmonary effort is normal. No  respiratory distress.     Breath sounds: No wheezing or rhonchi.  Abdominal:     General: Abdomen is flat. Bowel sounds are normal.     Palpations: Abdomen is soft.     Tenderness: There is no abdominal tenderness.  Skin:    General: Skin is warm and dry.  Neurological:     General: No focal deficit present.     Mental Status: She is alert.  Psychiatric:        Mood and Affect: Mood normal.        Behavior: Behavior normal.     Palliative Assessment/Data: 40-50%    Existing  Vynca/ACP Documentation: None  Assessment & Plan:   Impression: Present on Admission:  HTN (hypertension)  GERD (gastroesophageal reflux disease)  HLD (hyperlipidemia)  Hypokalemia  Leucocytosis  Intractable nausea and vomiting  89 year old female with acute presentation of chronic comorbidities as described above.  At this point she is gastric outlet obstruction related to a large hiatal hernia with her pylorus sitting in the chest.  NG tube has been removed and her diet is advancing.  Appears near discharge, plans for SNF/rehab.  Family has mentioned independently that hospice would be a viable option if they got that point.  Overall prognosis guarded.  SUMMARY OF RECOMMENDATIONS   Remain DNR-limited Does not think she would want feeding tubes Continue to treat the treatable Anticipate discharge in the coming 24 to 48 hours pending continued stable/improved Palliative medicine will sign off Please notify us of any significant clinical change or new palliative needs  Symptom Management:  Per primary team PMT is available to assist as needed  Code Status: DNR-Limited  Prognosis: > 12 months  Discharge Planning:  SNF/Rehab  Discussed with: Patient, medical team, nursing team  Thank you for allowing [place] to participate in the care of Laura Cook PMT will continue to support holistically.  Time Total: 40 min  Detailed review of medical records (labs, imaging, vital signs), medically appropriate exam, discussed with treatment team, counseling and education to patient, family, & staff, documenting clinical information, medication management, coordination of care  Wynne Dust, NP Palliative Medicine Team  Team Phone # [phone] (Nights/Weekends)  10/19/2020, 8:17 AM

## 2022-12-13 DIAGNOSIS — I10 Essential (primary) hypertension: Secondary | ICD-10-CM | POA: Diagnosis not present

## 2022-12-13 DIAGNOSIS — R1084 Generalized abdominal pain: Secondary | ICD-10-CM

## 2022-12-13 DIAGNOSIS — R112 Nausea with vomiting, unspecified: Secondary | ICD-10-CM | POA: Diagnosis not present

## 2022-12-13 DIAGNOSIS — N390 Urinary tract infection, site not specified: Secondary | ICD-10-CM

## 2022-12-13 LAB — BASIC METABOLIC PANEL
Anion gap: 8 (ref 5–15)
BUN: 11 mg/dL (ref 8–23)
CO2: 22 mmol/L (ref 22–32)
Calcium: 8.3 mg/dL — ABNORMAL LOW (ref 8.9–10.3)
Chloride: 100 mmol/L (ref 98–111)
Creatinine, Ser: 0.53 mg/dL (ref 0.44–1.00)
GFR, Estimated: >60 mL/min (ref >60)
Glucose, Bld: 95 mg/dL (ref 70–99)
Potassium: 3.7 mmol/L (ref 3.5–5.1)
Sodium: 130 mmol/L — ABNORMAL LOW (ref 135–145)

## 2022-12-13 LAB — MAGNESIUM: Magnesium: 1.8 mg/dL (ref 1.7–2.4)

## 2022-12-13 MED ORDER — TAMSULOSIN HCL 0.4 MG PO CAPS: 0.4000 mg | ORAL_CAPSULE | Freq: Every day | ORAL | Status: AC

## 2022-12-13 MED ORDER — ONDANSETRON HCL 4 MG PO TABS: 4.0000 mg | ORAL_TABLET | Freq: Three times a day (TID) | ORAL | Status: AC | PRN

## 2022-12-13 MED ORDER — TAMSULOSIN HCL 0.4 MG PO CAPS
0.4000 mg | ORAL_CAPSULE | Freq: Every day | ORAL | Status: DC
  Administered 2022-12-13 – 2022-12-14 (×2): 0.4 mg via ORAL
  Filled 2022-12-13 (×2): qty 1

## 2022-12-13 NOTE — Plan of Care (Signed)
  Problem: Education: Goal: Knowledge of General Education information will improve Description Including pain rating scale, medication(s)/side effects and non-pharmacologic comfort measures Outcome: Progressing   

## 2022-12-13 NOTE — Progress Notes (Signed)
Physical Therapy Treatment Patient Details Name: Laura Cook MRN: [id number] DOB: [birth year] Today's Date: 12/13/2022   History of Present Illness Pt is a 89 y/o F admitted on 12/07/22 after presenting with c/o intractable N&V & abdominal pain. Pt found to have possible gastric outlet obstruction, possibly from hiatal hernia. Imaging also found R kidney mass concerning for renal cell carcinoma. PMH: essential HTN, HLD, GERD    PT Comments  Pt seen for PT tx with pt agreeable. Pt very pleasant, motivated to participate, & eager to get better but limited by fatigue. Pt requires min assist for STS from chair, supervision to ambulate with RW in hallway. Pt engages in seated exercises for strengthening. Pt requires rest breaks throughout & ultimately sates she feels like "that's enough". Pt assisted back to bed & left with all needs in reach, son present in room.    If plan is discharge home, recommend the following: A little help with walking and/or transfers;A little help with bathing/dressing/bathroom;Assistance with cooking/housework;Direct supervision/assist for financial management;Assist for transportation;Supervision due to cognitive status;Help with stairs or ramp for entrance;Direct supervision/assist for medications management   Can travel by private vehicle     Yes  Equipment Recommendations  None recommended by PT (defer to next venue)    Recommendations for Other Services       Precautions / Restrictions Precautions Precautions: Fall Restrictions Weight Bearing Restrictions: No     Mobility  Bed Mobility Overal bed mobility: Needs Assistance Bed Mobility: Sit to Supine       Sit to supine: Mod assist (assistance to elevate BLE onto bed)   General bed mobility comments: Pt is able to scoot to [facility] with bed in trendelenburg position with cuing for technique/use of bed rails.    Transfers Overall transfer level: Needs assistance Equipment used: Rolling walker (2  wheels) Transfers: Sit to/from Stand Sit to Stand: Min assist           General transfer comment: cuing & extra time to scoot out to edge of recliner seat, min assist to power up to standing    Ambulation/Gait Ambulation/Gait assistance: Supervision Gait Distance (Feet): 65 Feet Assistive device: Rolling walker (2 wheels) Gait Pattern/deviations: Step-through pattern, Decreased step length - right, Decreased step length - left, Decreased dorsiflexion - right, Decreased dorsiflexion - left, Decreased stride length Gait velocity: decreased     General Gait Details: extra time to maneuver around obstacles on L, ~2 brief standing rest breaks 2/2 fatigue   Stairs             Wheelchair Mobility     Tilt Bed    Modified Rankin (Stroke Patients Only)       Balance Overall balance assessment: Needs assistance Sitting-balance support: Feet supported Sitting balance-Leahy Scale: Fair Sitting balance - Comments: Pt able to doff shoes sitting EOB without LOB.   Standing balance support: During functional activity, Bilateral upper extremity supported, Reliant on assistive device for balance Standing balance-Leahy Scale: Fair                              Cognition Arousal: Alert Behavior During Therapy: WFL for tasks assessed/performed Overall Cognitive Status: History of cognitive impairments - at baseline                                 General Comments: Pt very sweet, motivated to participate &  get better.        Exercises General Exercises - Lower Extremity Long Arc Quad: AROM, Seated, Strengthening, Both, 10 reps Hip ABduction/ADduction: AROM, Seated, Strengthening, Both, 10 reps (hip adduction pillow squeezes) Hip Flexion/Marching: AROM, Seated, Strengthening, Both, 10 reps    General Comments General comments (skin integrity, edema, etc.): Pt on room air, SPO2 >90% throughout, HR 108 bpm.      Pertinent Vitals/Pain Pain  Assessment Pain Assessment: No/denies pain    Home Living                          Prior Function            PT Goals (current goals can now be found in the care plan section) Acute Rehab PT Goals Patient Stated Goal: get better/stronger PT Goal Formulation: With patient/family Time For Goal Achievement: 12/25/22 Potential to Achieve Goals: Good Progress towards PT goals: Progressing toward goals    Frequency    Min 1X/week      PT Plan      Co-evaluation              AM-PAC PT "6 Clicks" Mobility   Outcome Measure  Help needed turning from your back to your side while in a flat bed without using bedrails?: None Help needed moving from lying on your back to sitting on the side of a flat bed without using bedrails?: A Little Help needed moving to and from a bed to a chair (including a wheelchair)?: A Little Help needed standing up from a chair using your arms (e.g., wheelchair or bedside chair)?: A Little Help needed to walk in hospital room?: A Little Help needed climbing 3-5 steps with a railing? : A Lot 6 Click Score: 18    End of Session   Activity Tolerance: Patient tolerated treatment well;Patient limited by fatigue Patient left: in bed;with call bell/phone within reach;with bed alarm set;with family/visitor present   PT Visit Diagnosis: Muscle weakness (generalized) (M62.81);Difficulty in walking, not elsewhere classified (R26.2)     Time: 3151-7616 PT Time Calculation (min) (ACUTE ONLY): 15 min  Charges:    $Therapeutic Activity: 8-22 mins PT General Charges $$ ACUTE PT VISIT: 1 Visit                     Aleda Grana, PT, DPT 12/13/22, 2:56 PM    Sandi Mariscal 12/13/2022, 2:55 PM

## 2022-12-13 NOTE — Discharge Summary (Signed)
Physician Discharge Summary   Patient: Laura Cook MRN: [id number] DOB: 18-Aug-1922  Admit date:     12/07/2022  Discharge date: 12/13/22  Discharge Physician: Thad Ranger, MD    PCP: Kirstie Peri, MD   Recommendations at discharge:   Continue Flomax 0.4 mg daily Continue Foley catheter, patient needs to follow-up with Dr. Ronne Binning, [facility] within next 2 weeks for the voiding trial and right renal mass.  Discharge Diagnoses:    Intractable nausea and vomiting   HTN (hypertension)   GERD (gastroesophageal reflux disease)   HLD (hyperlipidemia)   Hypokalemia   Leucocytosis Acute urinary retention, pelvic floor laxity   Hospital Course:   89 y.o. female with medical history significant of essential hypertension, hyperlipidemia and GERD presented with intractable nausea, vomiting and abdominal pain.  On presentation, she was found to have markedly distended stomach and small bowel with suspicion for gastric outlet obstruction.  She was started on IV fluids.  GI and general surgery were consulted.  Palliative care was also consulted.  Patient managed conservatively and diet has been advanced by general surgery: General Surgery signed off on 12/11/2022.    Assessment and Plan:  Intractable nausea and vomiting secondary to possible gastric outlet obstruction,hiatal hernia -General Surgery was consulted, recommended conservative management with NGT for decompression.   -NGT has been removed, diet was advanced, -Continue soft diet    Possible UTI: Present on admission -Has received Rocephin for 5 days.  Urine culture however showed no growth.    Right kidney mass concerning for renal cell carcinoma Goals of care -Imaging concerning for right superior pole renal cell carcinoma -Dr Hanley Ben discussed with urology, Dr. Ronne Binning, recommended outpatient follow-up for further workup.   -Palliative medicine was also consulted, recommended if patient deteriorates, she might be  a candidate for hospice.  DNR.   Acute urinary retention Pelvic floor laxity -Has required Foley catheter since admission.   -DC with Foley, started on Flomax, outpatient follow-up with Dr. Ronne Binning within 10-14 days   Normocytic anemia -Possibly from anemia of chronic disease.  Hemoglobin currently stable.  Monitor intermittently   Delirium/acute metabolic encephalopathy -Patient probably has undiagnosed underlying dementia.  Has had intermittent confusion and agitation  -PT recommended SNF, continue delirium precautions, fall precautions.    Leukocytosis -Resolved   Thrombocytosis -Resolved   Hyponatremia -Sodium 130, asymptomatic, encourage oral intake   Hypokalemia -Resolved   GERD -Continue PPI   Hyperlipidemia -Resume statin   Essential hypertension -Continue Norvasc        Pain control - [place] Controlled Substance Reporting System database was reviewed. and patient was instructed, not to drive, operate heavy machinery, perform activities at heights, swimming or participation in water activities or provide baby-sitting services while on Pain, Sleep and Anxiety Medications; until their outpatient Physician has advised to do so again. Also recommended to not to take more than prescribed Pain, Sleep and Anxiety Medications.  Consultants: General Surgery, urology Procedures performed: None Disposition: Skilled nursing facility Diet recommendation: SOFT DIET  DISCHARGE MEDICATION: Allergies as of 12/13/2022       Reactions   Tape Other (See Comments)   THE SKIN IS DELICATE AND WILL TEAR AND BRUISE VERY EASILY!!        Medication List     STOP taking these medications    cephALEXin 500 MG capsule Commonly known as: KEFLEX   metoprolol succinate 50 MG 24 hr tablet Commonly known as: Toprol XL       TAKE these medications  amLODipine 5 MG tablet Commonly known as: NORVASC Take 5 mg by mouth daily.   aspirin EC 81 MG tablet Take 81  mg by mouth daily. Swallow whole.   diclofenac Sodium 1 % Gel Commonly known as: VOLTAREN Apply 2 g topically See admin instructions. Apply 2 grams to affected shoulder twice a day   esomeprazole 20 MG capsule Commonly known as: NEXIUM Take 20 mg by mouth daily before breakfast.   famotidine 40 MG tablet Commonly known as: PEPCID Take 40 mg by mouth daily before breakfast.   ketotifen 0.025 % ophthalmic solution Commonly known as: ZADITOR Place 1 drop into both eyes in the morning and at bedtime.   meclizine 25 MG tablet Commonly known as: ANTIVERT Take 12.5-25 mg by mouth every 8 (eight) hours as needed for dizziness.   ondansetron 4 MG tablet Commonly known as: ZOFRAN Take 1 tablet (4 mg total) by mouth every 8 (eight) hours as needed for nausea or vomiting.   polyethylene glycol 17 g packet Commonly known as: MiraLax Take 17 g by mouth daily. What changed:  how much to take when to take this reasons to take this   Premarin vaginal cream Generic drug: conjugated estrogens Place 1 g vaginally daily.   PRESERVISION AREDS 2 PO Take 1 capsule by mouth in the morning and at bedtime.   rosuvastatin 10 MG tablet Commonly known as: CRESTOR Take 10 mg by mouth at bedtime.   tamsulosin 0.4 MG Caps capsule Commonly known as: FLOMAX Take 1 capsule (0.4 mg total) by mouth daily.   TYLENOL 500 MG tablet Generic drug: acetaminophen Take 500 mg by mouth every 6 (six) hours as needed for mild pain (pain score 1-3) or headache.   Vitamin D3 25 MCG (1000 UT) Caps Take 1,000 Units by mouth in the morning.        Contact information for follow-up providers     Connect with your PCP/Specialist as discussed. Schedule an appointment as soon as possible for a visit .   Contact information: [email or web address] Call our physician referral line at [phone].        McKenzie, Mardene Celeste, MD. Schedule an appointment as soon as possible for a visit in  2 week(s).   Specialty: Urology Why: for hospital follow-up Contact information: [address] [place] [address] [phone]         Kirstie Peri, MD. Schedule an appointment as soon as possible for a visit in 2 week(s).   Specialty: Internal Medicine Why: for hospital follow-up Contact information: [address] [place] [address] [phone]              Contact information for after-discharge care     Destination     [facility] Preferred SNF .   Service: Skilled Nursing Contact information: [address] [place] [address] [phone]                    Discharge Exam: s: No acute issues, no fevers or chills, chest pain.  BP (!) 148/58 (BP Location: Right Arm)   Pulse 66   Temp 98.5 F (36.9 C) (Oral)   Resp 16   SpO2 99%    Physical Exam General: Alert and oriented x 3, NAD Cardiovascular: S1 S2 clear, RRR.  Respiratory: CTAB, no wheezing Gastrointestinal: Soft, nontender, nondistended, NBS Ext: no pedal edema bilaterally Neuro: no new deficits Psych: Normal affect   Condition at discharge: fair  The results of  significant diagnostics from this hospitalization (including imaging, microbiology, ancillary and laboratory) are listed below for reference.   Imaging Studies: DG Abd 1 View  Result Date: 12/08/2022 CLINICAL DATA:  Nasogastric tube placement EXAM: ABDOMEN - 1 VIEW COMPARISON:  Yesterday FINDINGS: Enteric tube with tip and side port at the stomach. Hazy density at the left base primarily from underpenetration based on prior CT. No convincing interval opacification/aspiration. No cardiac enlargement when accounting for rotation and hiatal hernia. IMPRESSION: 1. Located enteric tube with tip and side port at the stomach. 2. Large hiatal hernia. Electronically Signed   By: Tiburcio Pea M.D.   On: 12/08/2022 06:16   DG Abd 1 View  Result Date: 12/07/2022 CLINICAL DATA:  Confirm NG tube  placement EXAM: ABDOMEN - 1 VIEW COMPARISON:  Same day CT abdomen and pelvis FINDINGS: Enteric tube tip and side port are in the stomach. Remainder unchanged from CT earlier today. IMPRESSION: Enteric tube tip and side-port in the stomach. Electronically Signed   By: Minerva Fester M.D.   On: 12/07/2022 20:42   CT ABDOMEN PELVIS W CONTRAST  Result Date: 12/07/2022 CLINICAL DATA:  89 year old female with lower abdominal pain coffee-ground emesis. EXAM: CT ABDOMEN AND PELVIS WITH CONTRAST TECHNIQUE: Multidetector CT imaging of the abdomen and pelvis was performed using the standard protocol following bolus administration of intravenous contrast. RADIATION DOSE REDUCTION: This exam was performed according to the departmental dose-optimization program which includes automated exposure control, adjustment of the mA and/or kV according to patient size and/or use of iterative reconstruction technique. CONTRAST:  OMNIPAQUE IOHEXOL 300 MG/ML  SOLN COMPARISON:  CT 09/19/2022 and pelvic ultrasound earlier today FINDINGS: Lower chest: No acute abnormality. Hepatobiliary: Presumed hemangioma in the posterior right hepatic dome. Cholelithiasis without evidence of acute cholecystitis. Pancreas: Cystic lesion near the uncinate process of the pancreas on series 2/image 28 measuring 2.2 cm. No ductal dilation. No evidence of acute pancreatitis. Spleen: Unremarkable. Adrenals/Urinary Tract: Unremarkable adrenal glands. Heterogenously enhancing mass in the superior pole of the right kidney measures 4.3 x 4.2 cm. No urinary calculi or hydronephrosis. Pelvic floor laxity with the bladder below the level of the pelvic inlet. Foley catheter within the bladder. The walls of the bladder are not clearly delineated. The bladder wall mass seen on same-day pelvic ultrasound is not well demonstrated on CT. Stomach/Bowel: Moderate mixed type hiatal hernia. The stomach is distended with abrupt narrowing at the pylorus duodenal junction.  No definite mass noting limitations of paucity of fat and motion. No evidence of obstruction. Moderate colonic stool load. No wall thickening. Colonic diverticulosis without diverticulitis. Vascular/Lymphatic: Aortic atherosclerosis. No enlarged abdominal or pelvic lymph nodes. Reproductive: Hysterectomy. Other: No free intraperitoneal air. Musculoskeletal: Demineralization. Postoperative changes both femurs. Remote left pubic rami fractures. No acute fracture. IMPRESSION: 1. Pelvic floor laxity with the bladder below the level of the pelvic inlet. Bladder wall thickening. The bladder wall mass seen on same-day pelvic ultrasound is not well demonstrated on CT. 2. Heterogenously enhancing mass in the superior pole of the right kidney measures 4.3 x 4.2 cm, concerning for renal cell carcinoma. 3. The stomach is distended with abrupt narrowing at the pylorus duodenal junction. No definite mass noting limitations of paucity of fat and motion. 4. Cystic lesion near the uncinate process of the pancreas measuring 2.2 cm. 5. Cholelithiasis without evidence of acute cholecystitis. 6. Colonic diverticulosis without diverticulitis. 7. Moderate mixed type hiatal hernia. Aortic Atherosclerosis (ICD10-I70.0). Electronically Signed   By: Angelique Holm.D.  On: 12/07/2022 20:12    Microbiology: Results for orders placed or performed during the hospital encounter of 12/07/22  Urine Culture     Status: None   Collection Time: 12/07/22  5:57 PM   Specimen: Urine, Catheterized  Result Value Ref Range Status   Specimen Description   Final    URINE, CATHETERIZED Performed at [facility], [address] [place], [place] [address]    Special Requests   Final    NONE Performed at [facility], [address], [place], [place] [address]    Culture   Final    NO GROWTH Performed at [facility], [address], [place], [place] [address]    Report Status 12/09/2022 FINAL   Final    Labs: CBC: Recent Labs  Lab 12/07/22 1707 12/08/22 0527 12/09/22 0523 12/10/22 0528 12/11/22 0521 12/12/22 0615  WBC 12.7* 10.5 9.4 7.4 8.0 6.0  NEUTROABS 11.0*  --  7.3 5.1 5.7 3.8  HGB 12.4 10.4* 9.4* 8.9* 9.4* 9.5*  HCT 37.3 32.4* 30.3* 28.5* 30.2* 30.9*  MCV 84.8 87.8 90.2 90.2 88.8 91.2  PLT 405* 338 290 276 287 266   Basic Metabolic Panel: Recent Labs  Lab 12/09/22 0523 12/10/22 0528 12/11/22 0521 12/12/22 0615 12/13/22 0546  NA 136 134* 131* 131* 130*  K 3.2* 4.2 4.1 3.7 3.7  CL 95* 100 98 100 100  CO2 30 26 25 23 22   GLUCOSE 107* 95 104* 93 95  BUN 24* 19 19 13 11   CREATININE 0.53 0.56 0.57 0.51 0.53  CALCIUM 8.9 8.6* 8.6* 8.6* 8.3*  MG 2.0 1.8 1.8 1.7 1.8   Liver Function Tests: Recent Labs  Lab 12/07/22 1707 12/08/22 0527 12/09/22 0523  AST 18 15 18   ALT 14 12 12   ALKPHOS 74 61 55  BILITOT 1.6* 0.9 0.4  PROT 7.6 6.2* 5.6*  ALBUMIN 4.4 3.7 3.2*   CBG: No results for input(s): "GLUCAP" in the last 168 hours.  Discharge time spent: greater than 30 minutes.  Signed: Thad Ranger, MD [facility] 12/13/2022

## 2022-12-13 NOTE — Plan of Care (Signed)
  Problem: Safety: Goal: Ability to remain free from injury will improve Outcome: Progressing   Problem: Skin Integrity: Goal: Risk for impaired skin integrity will decrease Outcome: Progressing   Problem: Elimination: Goal: Will not experience complications related to bowel motility Outcome: Progressing   Problem: Coping: Goal: Level of anxiety will decrease Outcome: Progressing   Problem: Clinical Measurements: Goal: Ability to maintain clinical measurements within normal limits will improve Outcome: Progressing

## 2022-12-13 NOTE — TOC Progression Note (Signed)
Transition of Care [facility]) - Progression Note    Patient Details  Name: TENNLEY SALDIERNA MRN: [id number] Date of Birth: Apr 16, 1922  Transition of Care [facility]) CM/SW Contact  Otelia Santee, LCSW Phone Number: 12/13/2022, 12:29 PM  Clinical Narrative:    Pt planned to DC to [facility] for SNF today. Received message following DC being placed that SNF now unable to accept until tomorrow 10/24 as they have low number of nursing staff available today.    Expected Discharge Plan: Skilled Nursing Facility Barriers to Discharge: Continued Medical Work up  Expected Discharge Plan and Services In-house Referral: NA Discharge Planning Services: NA Post Acute Care Choice: Skilled Nursing Facility Living arrangements for the past 2 months: Single Family Home Expected Discharge Date: 12/13/22               DME Arranged: N/A DME Agency: NA                   Social Determinants of Health (SDOH) Interventions SDOH Screenings   Food Insecurity: No Food Insecurity (10/27/2021)   Received from [facility], [facility]  Transportation Needs: No Transportation Needs (10/27/2021)   Received from [facility], [facility]  Financial Resource Strain: Low Risk  (10/27/2021)   Received from [facility], [facility]  Tobacco Use: Low Risk  (12/08/2022)    Readmission Risk Interventions    12/08/2022    2:09 PM  Readmission Risk Prevention Plan  Post Dischage Appt Complete  Medication Screening Complete  Transportation Screening Complete

## 2022-12-13 NOTE — Progress Notes (Signed)
     Referral previously received for Laura Cook for goals of care discussion. Chart reviewed and updates received from RN. See last PMT note dated 12/12/2022.  Chart reviewed and no significant clinical changes or ongoing palliative needs noted. At this time goals are clear. Anticipate d/c today. We will sign off for now and discontinue consult order. Please contact us and enter a new consult order for any new palliative care needs.  Thank you for your referral and allowing PMT to assist in Laura Cook's care.   Wynne Dust, NP Palliative Medicine Team Phone: [phone]  NO CHARGE

## 2022-12-14 DIAGNOSIS — K219 Gastro-esophageal reflux disease without esophagitis: Secondary | ICD-10-CM | POA: Diagnosis not present

## 2022-12-14 DIAGNOSIS — R339 Retention of urine, unspecified: Secondary | ICD-10-CM | POA: Insufficient documentation

## 2022-12-14 DIAGNOSIS — R1084 Generalized abdominal pain: Secondary | ICD-10-CM | POA: Diagnosis not present

## 2022-12-14 DIAGNOSIS — R2689 Other abnormalities of gait and mobility: Secondary | ICD-10-CM | POA: Diagnosis not present

## 2022-12-14 DIAGNOSIS — Z7401 Bed confinement status: Secondary | ICD-10-CM | POA: Diagnosis not present

## 2022-12-14 DIAGNOSIS — R41841 Cognitive communication deficit: Secondary | ICD-10-CM | POA: Diagnosis not present

## 2022-12-14 DIAGNOSIS — R310 Gross hematuria: Secondary | ICD-10-CM | POA: Diagnosis not present

## 2022-12-14 DIAGNOSIS — R399 Unspecified symptoms and signs involving the genitourinary system: Secondary | ICD-10-CM | POA: Diagnosis not present

## 2022-12-14 DIAGNOSIS — E785 Hyperlipidemia, unspecified: Secondary | ICD-10-CM | POA: Diagnosis not present

## 2022-12-14 DIAGNOSIS — M62562 Muscle wasting and atrophy, not elsewhere classified, left lower leg: Secondary | ICD-10-CM | POA: Diagnosis not present

## 2022-12-14 DIAGNOSIS — E871 Hypo-osmolality and hyponatremia: Secondary | ICD-10-CM | POA: Diagnosis not present

## 2022-12-14 DIAGNOSIS — R112 Nausea with vomiting, unspecified: Secondary | ICD-10-CM | POA: Diagnosis not present

## 2022-12-14 DIAGNOSIS — N398 Other specified disorders of urinary system: Secondary | ICD-10-CM | POA: Diagnosis not present

## 2022-12-14 DIAGNOSIS — N3289 Other specified disorders of bladder: Secondary | ICD-10-CM | POA: Diagnosis not present

## 2022-12-14 DIAGNOSIS — I10 Essential (primary) hypertension: Secondary | ICD-10-CM | POA: Diagnosis not present

## 2022-12-14 DIAGNOSIS — R531 Weakness: Secondary | ICD-10-CM | POA: Diagnosis not present

## 2022-12-14 DIAGNOSIS — Z960 Presence of urogenital implants: Secondary | ICD-10-CM | POA: Diagnosis not present

## 2022-12-14 DIAGNOSIS — R32 Unspecified urinary incontinence: Secondary | ICD-10-CM | POA: Diagnosis not present

## 2022-12-14 DIAGNOSIS — R8290 Unspecified abnormal findings in urine: Secondary | ICD-10-CM | POA: Diagnosis not present

## 2022-12-14 DIAGNOSIS — N2889 Other specified disorders of kidney and ureter: Secondary | ICD-10-CM | POA: Diagnosis not present

## 2022-12-14 DIAGNOSIS — N390 Urinary tract infection, site not specified: Secondary | ICD-10-CM | POA: Diagnosis not present

## 2022-12-14 DIAGNOSIS — N814 Uterovaginal prolapse, unspecified: Secondary | ICD-10-CM | POA: Diagnosis not present

## 2022-12-14 DIAGNOSIS — N819 Female genital prolapse, unspecified: Secondary | ICD-10-CM | POA: Diagnosis not present

## 2022-12-14 DIAGNOSIS — D509 Iron deficiency anemia, unspecified: Secondary | ICD-10-CM | POA: Diagnosis not present

## 2022-12-14 DIAGNOSIS — N8184 Pelvic muscle wasting: Secondary | ICD-10-CM | POA: Diagnosis not present

## 2022-12-14 NOTE — Care Management Important Message (Signed)
Important Message  Patient Details IM Letter given. Name: AMEILIA HIRAKAWA MRN: [id number] Date of Birth: 03-15-1922   Important Message Given:  Yes - Medicare IM     Caren Macadam 12/14/2022, 9:36 AM

## 2022-12-14 NOTE — TOC Transition Note (Signed)
Transition of Care [facility]) - CM/SW Discharge Note   Patient Details  Name: Laura Cook MRN: [id number] Date of Birth: Jul 19, 1922  Transition of Care [facility]) CM/SW Contact:  Otelia Santee, LCSW Phone Number: 12/14/2022, 9:07 AM   Clinical Narrative:    Pt to transfer to [facility] rehab. Pt will be going to room 138 bed 2. RN to call report to [phone]. DC packet placed at RN station. [facility] called at 9:04am for transportation.    Final next level of care: Skilled Nursing Facility Barriers to Discharge: Barriers Resolved   Patient Goals and CMS Choice CMS Medicare.gov Compare Post Acute Care list provided to:: Patient Choice offered to / list presented to : Patient  Discharge Placement     Existing PASRR number confirmed : 12/11/22          Patient chooses bed at: Other - please specify in the comment section below: [facility]) Patient to be transferred to facility by: [facility] Name of family member notified: Patient Patient and family notified of of transfer: 12/14/22  Discharge Plan and Services Additional resources added to the After Visit Summary for   In-house Referral: NA Discharge Planning Services: NA Post Acute Care Choice: Skilled Nursing Facility          DME Arranged: N/A DME Agency: NA                  Social Determinants of Health (SDOH) Interventions SDOH Screenings   Food Insecurity: No Food Insecurity (10/27/2021)   Received from [facility], [facility]  Transportation Needs: No Transportation Needs (10/27/2021)   Received from [facility], [facility]  Financial Resource Strain: Low Risk  (10/27/2021)   Received from [facility], [facility]  Tobacco Use: Low Risk  (12/08/2022)     Readmission Risk Interventions    12/08/2022    2:09 PM  Readmission Risk Prevention Plan  Post Dischage Appt Complete  Medication Screening Complete  Transportation Screening Complete

## 2022-12-14 NOTE — Progress Notes (Signed)
[facility]                                                                              [name], is a 89 y.o. female, DOB - 1922/04/28, [id number] Admit date - 12/07/2022    Outpatient Primary MD for the patient is Kirstie Peri, MD  LOS - 7  days  Chief Complaint  Patient presents with   Abdominal Pain       Brief summary   89 y.o. female with medical history significant of essential hypertension, hyperlipidemia and GERD presented with intractable nausea, vomiting and abdominal pain.  On presentation, she was found to have markedly distended stomach and small bowel with suspicion for gastric outlet obstruction.  She was started on IV fluids.  GI and general surgery were consulted.  Palliative care was also consulted.  Patient managed conservatively and diet has been advanced by general surgery: General Surgery signed off on 12/11/2022.   Patient stable for discharge today 10/24   Assessment & Plan    Intractable nausea and vomiting secondary to possible gastric outlet obstruction,hiatal hernia -General Surgery was consulted, recommended conservative management with NGT for decompression.   -NGT has been removed, diet was advanced, -Continue soft diet -No acute issues     Possible UTI: Present on admission -Has received Rocephin for 5 days.  Urine culture however showed no growth.      Right kidney mass concerning for renal cell carcinoma Goals of care -Imaging concerning for right superior pole renal cell carcinoma -Dr Hanley Ben discussed with urology, Dr. Ronne Binning, recommended outpatient follow-up for further workup.   -Palliative medicine was also consulted, recommended if patient deteriorates, she might be a candidate for hospice.  DNR.   Acute urinary retention Pelvic floor laxity -Has required Foley catheter since admission.   -Patient will discharge with Foley, started on Flomax, outpatient follow-up with Dr. Ronne Binning within 10-14 days     Normocytic anemia -Possibly from anemia of chronic disease.  H/H stable.  Monitor intermittently   Delirium/acute metabolic encephalopathy -Patient probably has undiagnosed underlying dementia.  Has had intermittent confusion and agitation  -PT recommended SNF, continue delirium precautions, fall precautions.  -Currently at baseline no acute issues   Leukocytosis -Resolved   Thrombocytosis -Resolved   Hyponatremia -Sodium 130, asymptomatic, encourage oral intake   Hypokalemia -Resolved   GERD -Continue PPI   Hyperlipidemia -Resume statin   Essential hypertension -Continue Norvasc   Underweight Estimated body mass index is 18.7 kg/m as calculated from the following:   Height as of 12/05/22: 5\' 1"  (1.549 m).   Weight as of 12/05/22: 44.9 kg.  Code Status: DNR DVT Prophylaxis:  SCDs Start: 12/08/22 0214   Level of Care: Level of care: Med-Surg Family Communication:  Disposition Plan:      Remains inpatient appropriate: Patient is medically stable for discharge.  Discharge summary was done on 12/13/2022, stillstands, no changes.   Procedures:    Consultants:     Antimicrobials:   Anti-infectives (From admission, onward)    Start     Dose/Rate Route Frequency Ordered Stop   12/08/22 2200  cefTRIAXone (ROCEPHIN) 1 g in sodium  chloride 0.9 % 100 mL IVPB  Status:  Discontinued        1 g 200 mL/hr over 30 Minutes Intravenous Every 24 hours 12/08/22 1143 12/12/22 1045   12/07/22 2200  cefTRIAXone (ROCEPHIN) 1 g in sodium chloride 0.9 % 100 mL IVPB        1 g 200 mL/hr over 30 Minutes Intravenous  Once 12/07/22 2155 12/07/22 2312          Medications  Chlorhexidine Gluconate Cloth  6 each Topical Daily   pantoprazole (PROTONIX) IV  40 mg Intravenous Q12H   tamsulosin  0.4 mg Oral Daily      Subjective:   Laura Cook was seen and examined today.  Sitting up in the chair, at baseline.  Patient denies dizziness, chest pain, shortness of breath,  abdominal pain, N/V.  Looking forward to discharge today.  Objective:   Vitals:   12/13/22 0517 12/13/22 1220 12/13/22 2133 12/14/22 0454  BP: (!) 148/58 (!) 145/80 131/67 (!) 104/53  Pulse: 66 94 80 79  Resp: 16 16 16 18   Temp: 98.5 F (36.9 C) 98.9 F (37.2 C) 98 F (36.7 C) 98.4 F (36.9 C)  TempSrc: Oral Oral Oral Oral  SpO2: 99% 99% 98% 100%    Intake/Output Summary (Last 24 hours) at 12/14/2022 0950 Last data filed at 12/14/2022 4098 Gross per 24 hour  Intake 720 ml  Output 450 ml  Net 270 ml     Wt Readings from Last 3 Encounters:  12/05/22 44.9 kg  10/20/21 44.9 kg  10/18/21 45 kg     Exam General: Alert and oriented x 3, NAD Cardiovascular: S1 S2 auscultated,  RRR Respiratory: Clear to auscultation bilaterally, no wheezing Gastrointestinal: Soft, nontender, nondistended, + bowel sounds Ext: no pedal edema bilaterally Neuro: ambulating in the room with walker, no new deficits Psych: Normal affect     Data Reviewed:  I have personally reviewed following labs    CBC Lab Results  Component Value Date   WBC 6.0 12/12/2022   RBC 3.39 (L) 12/12/2022   HGB 9.5 (L) 12/12/2022   HCT 30.9 (L) 12/12/2022   MCV 91.2 12/12/2022   MCH 28.0 12/12/2022   PLT 266 12/12/2022   MCHC 30.7 12/12/2022   RDW 14.6 12/12/2022   LYMPHSABS 1.2 12/12/2022   MONOABS 0.6 12/12/2022   EOSABS 0.3 12/12/2022   BASOSABS 0.1 12/12/2022     Last metabolic panel Lab Results  Component Value Date   NA 130 (L) 12/13/2022   K 3.7 12/13/2022   CL 100 12/13/2022   CO2 22 12/13/2022   BUN 11 12/13/2022   CREATININE 0.53 12/13/2022   GLUCOSE 95 12/13/2022   GFRNONAA >60 12/13/2022   CALCIUM 8.3 (L) 12/13/2022   PHOS 1.8 (L) 11/19/2020   PHOS 1.8 (L) 11/19/2020   PROT 5.6 (L) 12/09/2022   ALBUMIN 3.2 (L) 12/09/2022   BILITOT 0.4 12/09/2022   ALKPHOS 55 12/09/2022   AST 18 12/09/2022   ALT 12 12/09/2022   ANIONGAP 8 12/13/2022    CBG (last 3)  No results for  input(s): "GLUCAP" in the last 72 hours.    Coagulation Profile: Recent Labs  Lab 12/07/22 1707  INR 1.1     Radiology Studies: I have personally reviewed the imaging studies  No results found.     Thad Ranger M.D. [facility] 12/14/2022, 9:50 AM  Available via Epic secure chat 7am-7pm After 7 pm, please refer to night coverage provider listed on amion.

## 2022-12-14 NOTE — Progress Notes (Signed)
Called and spoke with Deanna Artis RN at [facility]. Report given, aware Sharin Mons is here now for transport.

## 2022-12-15 DIAGNOSIS — I10 Essential (primary) hypertension: Secondary | ICD-10-CM | POA: Diagnosis not present

## 2022-12-15 DIAGNOSIS — R112 Nausea with vomiting, unspecified: Secondary | ICD-10-CM | POA: Diagnosis not present

## 2022-12-15 DIAGNOSIS — N2889 Other specified disorders of kidney and ureter: Secondary | ICD-10-CM | POA: Insufficient documentation

## 2022-12-15 DIAGNOSIS — K219 Gastro-esophageal reflux disease without esophagitis: Secondary | ICD-10-CM | POA: Diagnosis not present

## 2022-12-18 ENCOUNTER — Ambulatory Visit: Payer: Self-pay | Admitting: Urology

## 2022-12-22 ENCOUNTER — Encounter: Payer: Self-pay | Admitting: Urology

## 2022-12-27 NOTE — Progress Notes (Unsigned)
Name: Laura Cook DOB: 04/10/1922 MRN: [id number]  History of Present Illness: Laura Cook is a 89 y.o. female who presents today for follow up visit at [facility] [place].  At last visit with Dr. Ronne Binning on 01/04/2022: Seen for gross hematuria follow up. Doing well.   Since last visit: > 09/27/2022: Referred to [facility] for prolapse per patient request.  > 12/07/2022 - 12/13/2022: Admitted for intractable abdominal pain with nausea and vomiting secondary to possible gastric outlet obstruction.  - UA abnormal with >50 WBC/hpf and >50 RBC/hpf; no bacteria. Treated with Rocephin during admission for possible UTI however urine culture came back negative.  - CT abdomen/pelvis w/ contrast on 12/07/2022 showed a right renal mass concerning for renal cell carcinoma ("Heterogenously enhancing mass in the superior pole of the right kidney measures 4.3 x 4.2 cm." - Acute urinary retention. No hydronephrosis on CT. Foley catheter placed upon admission. Started on Flomax 0.4 mg daily. - Procidentia. Per ER note: "a prolapse noted through the vagina. The prolapse is a size of approximately a baseball. There is no bleeding associated with it no discharge. Any gentle manipulation of this prolapse causes her pain and discomfort." CT showed "Pelvic floor laxity with the bladder below the level of the pelvic inlet. Bladder wall thickening. The bladder wall mass seen on same-day pelvic ultrasound is not well demonstrated on CT."  > 12/13/2022: Normal renal function (creatinine 0.53, GFR >60).  ***No voiding trial - likely would go back into urinary retention due to procidentia. Advised to continue with indwelling Foley catheter until seen by Dr. Sherron Monday in [place], at [facility], or with GYN / Urogyn provider for procidentia management (likely with a pessary; colpocleisis may be considered however patient is a high risk surgical candidate based on comorbidities). ***MRI  abdomen/pelvis w/wo contrast for evaluation of renal mass and bladder wall thickening  Today: She reports ***  Pt reports chief complaint of urinary retention.  She {Actions; denies-reports:120008} history of urinary retention prior to current episode. She reports the catheter is draining ***well. She {Actions; denies-reports:120008} gross hematuria.  She {Actions; denies-reports:120008} flank pain or abdominal pain. She {Actions; denies-reports:120008} fevers, nausea, or vomiting.  She {Actions; denies-reports:120008} vaginal pain, bleeding, discharge. She {Actions; denies-reports:120008} using topical vaginal estrogen cream *** time(s) per week. She {Actions; denies-reports:120008} vaginal bulge sensation.  She {Actions; denies-reports:120008} seeing a vaginal bulge. This bulge is bothersome and is the size of ***. It was first noticed ***.   Fall Screening: Do you usually have a device to assist in your mobility? {yes/no:20286} ***cane / ***walker / ***wheelchair   Medications: Current Outpatient Medications  Medication Sig Dispense Refill   amLODipine (NORVASC) 5 MG tablet Take 5 mg by mouth daily.     aspirin EC 81 MG tablet Take 81 mg by mouth daily. Swallow whole.     Cholecalciferol (VITAMIN D3) 25 MCG (1000 UT) CAPS Take 1,000 Units by mouth in the morning.     diclofenac Sodium (VOLTAREN) 1 % GEL Apply 2 g topically See admin instructions. Apply 2 grams to affected shoulder twice a day     esomeprazole (NEXIUM) 20 MG capsule Take 20 mg by mouth daily before breakfast.     famotidine (PEPCID) 40 MG tablet Take 40 mg by mouth daily before breakfast.     ketotifen (ZADITOR) 0.025 % ophthalmic solution Place 1 drop into both eyes in the morning and at bedtime.     meclizine (ANTIVERT) 25 MG tablet Take 12.5-25 mg  by mouth every 8 (eight) hours as needed for dizziness.     Multiple Vitamins-Minerals (PRESERVISION AREDS 2 PO) Take 1 capsule by mouth in the morning and at  bedtime.     ondansetron (ZOFRAN) 4 MG tablet Take 1 tablet (4 mg total) by mouth every 8 (eight) hours as needed for nausea or vomiting.     polyethylene glycol (MIRALAX) 17 g packet Take 17 g by mouth daily. (Patient taking differently: Take 8.5 g by mouth daily as needed for mild constipation.) 30 each 1   PREMARIN vaginal cream Place 1 g vaginally daily.     rosuvastatin (CRESTOR) 10 MG tablet Take 10 mg by mouth at bedtime.     tamsulosin (FLOMAX) 0.4 MG CAPS capsule Take 1 capsule (0.4 mg total) by mouth daily.     TYLENOL 500 MG tablet Take 500 mg by mouth every 6 (six) hours as needed for mild pain (pain score 1-3) or headache.     No current facility-administered medications for this visit.    Allergies: Allergies  Allergen Reactions   Tape Other (See Comments)    THE SKIN IS DELICATE AND WILL TEAR AND BRUISE VERY EASILY!!    Past Medical History:  Diagnosis Date   Hypertension    Past Surgical History:  Procedure Laterality Date   ABDOMINAL HYSTERECTOMY     DILATION AND CURETTAGE OF UTERUS     HIP FRACTURE SURGERY Left    INTRAMEDULLARY (IM) NAIL INTERTROCHANTERIC Right 11/15/2020   Procedure: INTRAMEDULLARY (IM) NAIL INTERTROCHANTRIC;  Surgeon: Oliver Barre, MD;  Location: [facility];  Service: Orthopedics;  Laterality: Right;   YAG LASER APPLICATION Left 11/10/2013   Procedure: YAG LASER APPLICATION;  Surgeon: Susa Simmonds, MD;  Location: [facility];  Service: Ophthalmology;  Laterality: Left;   No family history on file. Social History   Socioeconomic History   Marital status: Widowed    Spouse name: Not on file   Number of children: Not on file   Years of education: Not on file   Highest education level: Not on file  Occupational History   Not on file  Tobacco Use   Smoking status: Never    Passive exposure: Never   Smokeless tobacco: Never  Vaping Use   Vaping status: Not on file  Substance and Sexual Activity   Alcohol use: Never   Drug use: Never    Sexual activity: Not Currently  Other Topics Concern   Not on file  Social History Narrative   Not on file   Social Determinants of Health   Financial Resource Strain: Low Risk  (10/27/2021)   Received from [facility]   Overall Financial Resource Strain (CARDIA)    Difficulty of Paying Living Expenses: Not hard at all  Food Insecurity: No Food Insecurity (10/27/2021)   Received from [facility]   Hunger Vital Sign    Worried About Running Out of Food in the Last Year: Never true    Ran Out of Food in the Last Year: Never true  Transportation Needs: No Transportation Needs (12/22/2022)   Received from [facility]   PRAPARE - Transportation    Lack of Transportation (Medical): No    Lack of Transportation (Non-Medical): No  Physical Activity: Not on file  Stress: Not on file  Social Connections: Not on file  Intimate Partner Violence: Not on file    Review of Systems*** Constitutional: Patient denies any unintentional weight loss  or change in strength lntegumentary: Patient denies any rashes or pruritus Eyes: Patient {Actions; denies-reports:120008} dry eyes ENT: Patient {Actions; denies-reports:120008} dry mouth Cardiovascular: Patient denies chest pain or syncope Respiratory: Patient denies shortness of breath Gastrointestinal: Patient ***denies nausea, vomiting, constipation, or diarrhea Musculoskeletal: Patient denies muscle cramps or weakness Neurologic: Patient denies convulsions or seizures Allergic/Immunologic: Patient denies recent allergic reaction(s) Hematologic/Lymphatic: Patient denies bleeding tendencies Endocrine: Patient denies heat/cold intolerance  GU: As per HPI.  OBJECTIVE There were no vitals filed for this visit. There is no height or weight on file to calculate BMI.  Physical Examination*** Constitutional: No obvious distress; patient is non-toxic appearing  Cardiovascular: No visible lower extremity  edema.  Respiratory: The patient does not have audible wheezing/stridor; respirations do not appear labored  Gastrointestinal: Abdomen non-distended Musculoskeletal: Normal ROM of UEs  Skin: No obvious rashes/open sores  Neurologic: CN 2-12 grossly intact Psychiatric: Answered questions appropriately with normal affect  Hematologic/Lymphatic/Immunologic: No obvious bruises or sites of spontaneous bleeding  UA: ***negative *** WBC/hpf, *** RBC/hpf, bacteria (***) PVR: *** ml  ASSESSMENT No diagnosis found. ***  Will plan for follow up in *** months / ***1 year or sooner if needed. Pt verbalized understanding and agreement. All questions were answered.  PLAN Advised the following: 1. *** 2. ***No follow-ups on file.  No orders of the defined types were placed in this encounter.   It has been explained that the patient is to follow regularly with their PCP in addition to all other providers involved in their care and to follow instructions provided by these respective offices. Patient advised to contact urology clinic if any urologic-pertaining questions, concerns, new symptoms or problems arise in the interim period.  There are no Patient Instructions on file for this visit.  Electronically signed by:  Donnita Falls, FNP   12/27/22    2:54 PM

## 2022-12-28 ENCOUNTER — Encounter: Payer: Self-pay | Admitting: Urology

## 2022-12-28 ENCOUNTER — Ambulatory Visit: Payer: HMO | Admitting: Urology

## 2022-12-28 VITALS — BP 116/59 | HR 90 | Temp 98.4°F | Ht 61.0 in | Wt 98.9 lb

## 2022-12-28 DIAGNOSIS — N3289 Other specified disorders of bladder: Secondary | ICD-10-CM | POA: Diagnosis not present

## 2022-12-28 DIAGNOSIS — R102 Pelvic and perineal pain: Secondary | ICD-10-CM

## 2022-12-28 DIAGNOSIS — N813 Complete uterovaginal prolapse: Secondary | ICD-10-CM

## 2022-12-28 DIAGNOSIS — R8290 Unspecified abnormal findings in urine: Secondary | ICD-10-CM | POA: Diagnosis not present

## 2022-12-28 DIAGNOSIS — N398 Other specified disorders of urinary system: Secondary | ICD-10-CM | POA: Diagnosis not present

## 2022-12-28 DIAGNOSIS — N814 Uterovaginal prolapse, unspecified: Secondary | ICD-10-CM

## 2022-12-28 DIAGNOSIS — N362 Urethral caruncle: Secondary | ICD-10-CM

## 2022-12-28 DIAGNOSIS — N898 Other specified noninflammatory disorders of vagina: Secondary | ICD-10-CM

## 2022-12-28 DIAGNOSIS — N2889 Other specified disorders of kidney and ureter: Secondary | ICD-10-CM

## 2022-12-28 DIAGNOSIS — R399 Unspecified symptoms and signs involving the genitourinary system: Secondary | ICD-10-CM | POA: Diagnosis not present

## 2022-12-28 DIAGNOSIS — R32 Unspecified urinary incontinence: Secondary | ICD-10-CM | POA: Diagnosis not present

## 2022-12-28 DIAGNOSIS — R262 Difficulty in walking, not elsewhere classified: Secondary | ICD-10-CM | POA: Insufficient documentation

## 2022-12-28 DIAGNOSIS — Z978 Presence of other specified devices: Secondary | ICD-10-CM

## 2022-12-28 DIAGNOSIS — N952 Postmenopausal atrophic vaginitis: Secondary | ICD-10-CM | POA: Insufficient documentation

## 2022-12-28 DIAGNOSIS — R339 Retention of urine, unspecified: Secondary | ICD-10-CM

## 2022-12-28 LAB — URINALYSIS, ROUTINE W REFLEX MICROSCOPIC
Bilirubin, UA: NEGATIVE
Glucose, UA: NEGATIVE
Ketones, UA: NEGATIVE
Nitrite, UA: POSITIVE — AB
Specific Gravity, UA: 1.025 (ref 1.005–1.030)
Urobilinogen, Ur: 1 mg/dL (ref 0.2–1.0)
pH, UA: 7 (ref 5.0–7.5)

## 2022-12-28 LAB — MICROSCOPIC EXAMINATION
RBC, Urine: >30 /[HPF] — AB (ref 0–2)
WBC, UA: >30 /[HPF] — AB (ref 0–5)

## 2022-12-28 LAB — BLADDER SCAN AMB NON-IMAGING: Scan Result: 48

## 2022-12-28 MED ORDER — CEPHALEXIN 500 MG PO CAPS: 500.0000 mg | ORAL_CAPSULE | Freq: Three times a day (TID) | ORAL | 0 refills | Status: AC

## 2022-12-28 NOTE — Progress Notes (Addendum)
post void residual =48  Pt is prepped for and in and out catherization. Patient was cleaned and prepped in a sterle fashion with betadine. A 16 fr catheter foley was inserted. Urine return was note 60 ml.  Performed by Evette Georges, FNP  Urine sent for culture

## 2022-12-29 ENCOUNTER — Encounter: Payer: Self-pay | Admitting: Urology

## 2023-01-01 LAB — URINE CULTURE

## 2023-01-02 ENCOUNTER — Other Ambulatory Visit: Payer: Self-pay | Admitting: Urology

## 2023-01-02 ENCOUNTER — Telehealth: Payer: Self-pay

## 2023-01-02 DIAGNOSIS — Z1624 Resistance to multiple antibiotics: Secondary | ICD-10-CM

## 2023-01-02 DIAGNOSIS — N390 Urinary tract infection, site not specified: Secondary | ICD-10-CM

## 2023-01-02 MED ORDER — FOSFOMYCIN TROMETHAMINE 3 G PO PACK: PACK | ORAL | 0 refills | Status: DC

## 2023-01-02 NOTE — Telephone Encounter (Signed)
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 I am sending in Fosfomycin 3 grams x3 doses as alternative; she should take 1 dose today, take the second dose in 3 days, and the third dose 3 days after the second dose.   Also please let her know that we are continuing to try to facilitate getting her in urgently with Urogynecology for prolapse management.

## 2023-01-02 NOTE — Progress Notes (Signed)
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 I am sending in Fosfomycin 3 grams x3 doses as alternative; she should take 1 dose today, take the second dose in 3 days, and the third dose 3 days after the second dose.   Also please let her know that we are continuing to try to facilitate getting her in urgently with Urogynecology for prolapse management.

## 2023-01-02 NOTE — Telephone Encounter (Signed)
Message left at all 3 available numbers for patient to return call to office.

## 2023-01-03 ENCOUNTER — Telehealth: Payer: Self-pay

## 2023-01-03 NOTE — Telephone Encounter (Signed)
Son called our office back regarding his mother's results.  He states he preferred for [place] to contact the facility [facility] rehab in [place] regarding results and medication management.

## 2023-01-03 NOTE — Telephone Encounter (Signed)
Message left to return call to office.

## 2023-01-03 NOTE — Telephone Encounter (Signed)
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 I am sending in Fosfomycin 3 grams x3 doses as alternative; she should take 1 dose today, take the second dose in 3 days, and the third dose 3 days after the second dose.   Also please let her know that we are continuing to try to facilitate getting her in urgently with Urogynecology for prolapse management.

## 2023-01-04 NOTE — Telephone Encounter (Signed)
Spoke with patients nurse Brandy at [facility] on [address] and nurse stated they where aware of the positive urine culture and patient is currently being treated with Fosfomycin.

## 2023-01-10 ENCOUNTER — Ambulatory Visit: Payer: HMO | Admitting: Urology

## 2023-01-10 VITALS — BP 157/74 | HR 88

## 2023-01-10 DIAGNOSIS — N2889 Other specified disorders of kidney and ureter: Secondary | ICD-10-CM

## 2023-01-10 DIAGNOSIS — R310 Gross hematuria: Secondary | ICD-10-CM

## 2023-01-10 DIAGNOSIS — N819 Female genital prolapse, unspecified: Secondary | ICD-10-CM | POA: Diagnosis not present

## 2023-01-10 LAB — URINALYSIS, ROUTINE W REFLEX MICROSCOPIC
Bilirubin, UA: NEGATIVE
Glucose, UA: NEGATIVE
Ketones, UA: NEGATIVE
Nitrite, UA: NEGATIVE
RBC, UA: NEGATIVE
Specific Gravity, UA: 1.015 (ref 1.005–1.030)
Urobilinogen, Ur: 1 mg/dL (ref 0.2–1.0)
pH, UA: 7.5 (ref 5.0–7.5)

## 2023-01-10 LAB — MICROSCOPIC EXAMINATION: Epithelial Cells (non renal): >10 /[HPF] — AB (ref 0–10)

## 2023-01-10 NOTE — Progress Notes (Signed)
01/10/2023 11:41 AM   Laura Cook 01-30-1923 [id number]  Referring provider: Kirstie Peri, MD [address] [place],  [place] [address]  Frequent UTI   HPI: Laura Cook is a 89yo here for followup for frequent UTI, hematuria and a right renal mass. She underwent CT on 10/17 which showed a stable right upper pole renal mass. NO gross hematuria. She is in rehab after a hospitalization for UTI, weakness and pelvic pain. She is at a SNF currently. She had a foley at the time which improved her pelvic pain but she did not want to keep a foley. Foley has since been removed. She has stage 4 prolapse/cystocele and is awaiting referral for pessary placement.    PMH: Past Medical History:  Diagnosis Date   Hypertension     Surgical History: Past Surgical History:  Procedure Laterality Date   ABDOMINAL HYSTERECTOMY     DILATION AND CURETTAGE OF UTERUS     HIP FRACTURE SURGERY Left    INTRAMEDULLARY (IM) NAIL INTERTROCHANTERIC Right 11/15/2020   Procedure: INTRAMEDULLARY (IM) NAIL INTERTROCHANTRIC;  Surgeon: Oliver Barre, MD;  Location: [facility];  Service: Orthopedics;  Laterality: Right;   YAG LASER APPLICATION Left 11/10/2013   Procedure: YAG LASER APPLICATION;  Surgeon: Susa Simmonds, MD;  Location: [facility];  Service: Ophthalmology;  Laterality: Left;    Home Medications:  Allergies as of 01/10/2023       Reactions   Tape Other (See Comments)   THE SKIN IS DELICATE AND WILL TEAR AND BRUISE VERY EASILY!!        Medication List        Accurate as of January 10, 2023 11:41 AM. If you have any questions, ask your nurse or doctor.          amLODipine 5 MG tablet Commonly known as: NORVASC Take 5 mg by mouth daily.   aspirin EC 81 MG tablet Take 81 mg by mouth daily. Swallow whole.   diclofenac Sodium 1 % Gel Commonly known as: VOLTAREN Apply 2 g topically See admin instructions. Apply 2 grams to affected shoulder twice a day   esomeprazole 20 MG capsule Commonly  known as: NEXIUM Take 20 mg by mouth daily before breakfast.   famotidine 40 MG tablet Commonly known as: PEPCID Take 40 mg by mouth daily before breakfast.   fosfomycin 3 g Pack Commonly known as: MONUROL ONE DOSE = ONE PACKET (FOSFOMYCIN 3 GRAMS). INSTRUCTIONS - DAY 1: TAKE 1 DOSE. DAY 4: TAKE SECOND DOSE. DAY 7: TAKE THIRD DOSE.   ketotifen 0.025 % ophthalmic solution Commonly known as: ZADITOR Place 1 drop into both eyes in the morning and at bedtime.   meclizine 25 MG tablet Commonly known as: ANTIVERT Take 12.5-25 mg by mouth every 8 (eight) hours as needed for dizziness.   ondansetron 4 MG tablet Commonly known as: ZOFRAN Take 1 tablet (4 mg total) by mouth every 8 (eight) hours as needed for nausea or vomiting.   polyethylene glycol 17 g packet Commonly known as: MiraLax Take 17 g by mouth daily. What changed:  how much to take when to take this reasons to take this   Premarin vaginal cream Generic drug: conjugated estrogens Place 1 g vaginally daily.   PRESERVISION AREDS 2 PO Take 1 capsule by mouth in the morning and at bedtime.   rosuvastatin 10 MG tablet Commonly known as: CRESTOR Take 10 mg by mouth at bedtime.   tamsulosin 0.4 MG Caps capsule Commonly known as:  FLOMAX Take 1 capsule (0.4 mg total) by mouth daily.   TYLENOL 500 MG tablet Generic drug: acetaminophen Take 500 mg by mouth every 6 (six) hours as needed for mild pain (pain score 1-3) or headache.   Vitamin D3 25 MCG (1000 UT) Caps Take 1,000 Units by mouth in the morning.        Allergies:  Allergies  Allergen Reactions   Tape Other (See Comments)    THE SKIN IS DELICATE AND WILL TEAR AND BRUISE VERY EASILY!!    Family History: No family history on file.  Social History:  reports that she has never smoked. She has never been exposed to tobacco smoke. She has never used smokeless tobacco. She reports that she does not drink alcohol and does not use drugs.  ROS: All other  review of systems were reviewed and are negative except what is noted above in HPI  Physical Exam: BP (!) 157/74   Pulse 88   Constitutional:  Alert and oriented, No acute distress. HEENT: [place] AT, moist mucus membranes.  Trachea midline, no masses. Cardiovascular: No clubbing, cyanosis, or edema. Respiratory: Normal respiratory effort, no increased work of breathing. GI: Abdomen is soft, nontender, nondistended, no abdominal masses GU: No CVA tenderness.  Lymph: No cervical or inguinal lymphadenopathy. Skin: No rashes, bruises or suspicious lesions. Neurologic: Grossly intact, no focal deficits, moving all 4 extremities. Psychiatric: Normal mood and affect.  Laboratory Data: Lab Results  Component Value Date   WBC 6.0 12/12/2022   HGB 9.5 (L) 12/12/2022   HCT 30.9 (L) 12/12/2022   MCV 91.2 12/12/2022   PLT 266 12/12/2022    Lab Results  Component Value Date   CREATININE 0.53 12/13/2022    No results found for: "PSA"  No results found for: "TESTOSTERONE"  No results found for: "HGBA1C"  Urinalysis    Component Value Date/Time   COLORURINE YELLOW 12/07/2022 1757   APPEARANCEUR Turbid (A) 12/28/2022 1152   LABSPEC 1.020 12/07/2022 1757   PHURINE 6.0 12/07/2022 1757   GLUCOSEU Negative 12/28/2022 1152   HGBUR SMALL (A) 12/07/2022 1757   BILIRUBINUR Negative 12/28/2022 1152   KETONESUR 80 (A) 12/07/2022 1757   PROTEINUR 2+ (A) 12/28/2022 1152   PROTEINUR >=300 (A) 12/07/2022 1757   NITRITE Positive (A) 12/28/2022 1152   NITRITE NEGATIVE 12/07/2022 1757   LEUKOCYTESUR 3+ (A) 12/28/2022 1152   LEUKOCYTESUR LARGE (A) 12/07/2022 1757    Lab Results  Component Value Date   LABMICR See below: 12/28/2022   WBCUA >30 (A) 12/28/2022   LABEPIT 0-10 12/28/2022   BACTERIA Many (A) 12/28/2022    Pertinent Imaging: Ct 12/07/2022: Images reviewed and discussed with the patient  Results for orders placed during the hospital encounter of 12/07/22  DG Abd 1  View  Narrative CLINICAL DATA:  Nasogastric tube placement  EXAM: ABDOMEN - 1 VIEW  COMPARISON:  Yesterday  FINDINGS: Enteric tube with tip and side port at the stomach.  Hazy density at the left base primarily from underpenetration based on prior CT. No convincing interval opacification/aspiration. No cardiac enlargement when accounting for rotation and hiatal hernia.  IMPRESSION: 1. Located enteric tube with tip and side port at the stomach. 2. Large hiatal hernia.   Electronically Signed By: Tiburcio Pea M.D. On: 12/08/2022 06:16  No results found for this or any previous visit.  No results found for this or any previous visit.  No results found for this or any previous visit.  No results found for this  or any previous visit.  No valid procedures specified. No results found for this or any previous visit.  Results for orders placed during the hospital encounter of 06/30/21  CT Renal Stone Study  Narrative CLINICAL DATA:  Hematuria. History of nephrolithiasis. Hypertension.  EXAM: CT ABDOMEN AND PELVIS WITHOUT CONTRAST  TECHNIQUE: Multidetector CT imaging of the abdomen and pelvis was performed following the standard protocol without IV contrast.  RADIATION DOSE REDUCTION: This exam was performed according to the departmental dose-optimization program which includes automated exposure control, adjustment of the mA and/or kV according to patient size and/or use of iterative reconstruction technique.  COMPARISON:  None Available.  FINDINGS: Lower chest: Mild motion degradation. Grossly clear lung bases. Cardiomegaly, accentuated by a pectus excavatum deformity. Moderate to large hiatal hernia, with greater than 1/2 of the stomach positioned in the lower chest. Multivessel coronary artery atherosclerosis.  Hepatobiliary: Normal noncontrast appearance of the liver. A 2.2 cm gallstone without acute cholecystitis or biliary duct  dilatation.  Pancreas: Pancreatic atrophy is within normal variation for age. There may be a 9 mm pancreatic cystic lesion within the body on 24/2, but this is of doubtful clinical significance given patient age. No peripancreatic inflammation.  Spleen: Normal in size, without focal abnormality.  Adrenals/Urinary Tract: Mild bilateral adrenal thickening.  Upper pole right renal 3.2 cm lesion measures greater than fluid density on 18/2.  Low-density renal lesions are likely cysts including at up to 4.0 cm exophytic off the lower pole left kidney.  Right renal vascular calcifications. Left renal collecting system calculi versus renal vascular calcifications. Example interpolar left kidney at 2 mm.  No hydronephrosis. The ureters are difficult to follow, but there is no gross hydroureter.  Dependent hyperdense material within the left side of the bladder on 60/2 is presumably hemorrhage. Anterior bladder wall irregularity is secondary to small saccules.  Stomach/Bowel: Normal remainder of the stomach. Extensive colonic diverticulosis. Colonic stool burden suggests constipation. Normal small bowel.  Vascular/Lymphatic: Advanced aortic and branch vessel atherosclerosis. No abdominopelvic adenopathy.  Reproductive: Artifact degraded evaluation of the pelvis from proximal femur fixation bilaterally. No adnexal mass. Possible soft tissue fullness within the uterine body and fundus with central hypoattenuation including on sagittal image 58.  Other: Pelvic floor laxity. No significant free fluid. No free intraperitoneal air.  Musculoskeletal: Advanced osteopenia. Lumbosacral spondylosis. Moderate convex right lumbar spine curvature  IMPRESSION: 1. Left nephrolithiasis versus renal vascular calcifications. No obstructive uropathy. 2. Multifactorial degradation, primarily involving the pelvis, as detailed above. 3. Hyperattenuating material in the dependent bladder is  likely hemorrhage. Bladder saccules suggesting a component of outlet obstruction. Limited evaluation for underlying bladder lesion. 4. Cholelithiasis 5. Possible soft tissue fullness and heterogeneity within the uterine body and fundus. Suboptimally evaluated secondary to artifact within the pelvis. Correlate with postmenopausal bleeding and possibly pelvic ultrasound. Of note, the clinical history describes prior hysterectomy. If the patient is truly status post hysterectomy, this soft tissue fullness could alternatively represent neoplasm either exophytic off or adjacent to the urinary bladder. Consider contrast enhanced pelvic CT. 6.  Possible constipation. 7. Hiatal hernia 8. Upper pole right renal lesion is technically indeterminate for hemorrhagic cyst versus solid neoplasm. Given size and patient age, of doubtful clinical significance. If further evaluation is desired, renal ultrasound could be performed.   Electronically Signed By: Jeronimo Greaves M.D. On: 06/30/2021 14:46   Assessment & Plan:    1. Gross hematuria Resolved.  - Urinalysis, Routine w reflex microscopic  2. Renal mass, right We  discussed the natural hx of renal masses and the 80/20 malignant/benign likelihood. We disucssed the treatment options including active surveillance. Renal ablation, partial and radical nephrectomy.    No follow-ups on file.  Wilkie Aye, MD  [facility] [place]

## 2023-01-11 DIAGNOSIS — R531 Weakness: Secondary | ICD-10-CM | POA: Diagnosis not present

## 2023-01-15 DIAGNOSIS — I10 Essential (primary) hypertension: Secondary | ICD-10-CM | POA: Diagnosis not present

## 2023-01-15 DIAGNOSIS — N390 Urinary tract infection, site not specified: Secondary | ICD-10-CM | POA: Diagnosis not present

## 2023-01-15 DIAGNOSIS — Z299 Encounter for prophylactic measures, unspecified: Secondary | ICD-10-CM | POA: Diagnosis not present

## 2023-01-15 DIAGNOSIS — N814 Uterovaginal prolapse, unspecified: Secondary | ICD-10-CM | POA: Diagnosis not present

## 2023-01-15 DIAGNOSIS — Z604 Social exclusion and rejection: Secondary | ICD-10-CM | POA: Diagnosis not present

## 2023-01-15 DIAGNOSIS — K56609 Unspecified intestinal obstruction, unspecified as to partial versus complete obstruction: Secondary | ICD-10-CM | POA: Diagnosis not present

## 2023-01-15 DIAGNOSIS — Z87891 Personal history of nicotine dependence: Secondary | ICD-10-CM | POA: Diagnosis not present

## 2023-01-15 DIAGNOSIS — Z556 Problems related to health literacy: Secondary | ICD-10-CM | POA: Diagnosis not present

## 2023-01-15 DIAGNOSIS — I700 Atherosclerosis of aorta: Secondary | ICD-10-CM | POA: Diagnosis not present

## 2023-01-15 DIAGNOSIS — Z7982 Long term (current) use of aspirin: Secondary | ICD-10-CM | POA: Diagnosis not present

## 2023-01-15 DIAGNOSIS — R52 Pain, unspecified: Secondary | ICD-10-CM | POA: Diagnosis not present

## 2023-01-15 DIAGNOSIS — M1990 Unspecified osteoarthritis, unspecified site: Secondary | ICD-10-CM | POA: Diagnosis not present

## 2023-01-16 ENCOUNTER — Encounter: Payer: Self-pay | Admitting: Urology

## 2023-01-29 DIAGNOSIS — M1990 Unspecified osteoarthritis, unspecified site: Secondary | ICD-10-CM | POA: Diagnosis not present

## 2023-01-29 DIAGNOSIS — K56609 Unspecified intestinal obstruction, unspecified as to partial versus complete obstruction: Secondary | ICD-10-CM | POA: Diagnosis not present

## 2023-01-29 DIAGNOSIS — I10 Essential (primary) hypertension: Secondary | ICD-10-CM | POA: Diagnosis not present

## 2023-01-29 DIAGNOSIS — N390 Urinary tract infection, site not specified: Secondary | ICD-10-CM | POA: Diagnosis not present

## 2023-02-23 ENCOUNTER — Encounter: Payer: Self-pay | Admitting: Obstetrics & Gynecology

## 2023-02-23 ENCOUNTER — Ambulatory Visit: Payer: HMO | Admitting: Obstetrics & Gynecology

## 2023-02-23 VITALS — BP 135/65 | HR 77 | Ht 59.0 in | Wt 98.0 lb

## 2023-02-23 DIAGNOSIS — N993 Prolapse of vaginal vault after hysterectomy: Secondary | ICD-10-CM | POA: Diagnosis not present

## 2023-02-23 NOTE — Progress Notes (Signed)
 Chief Complaint  Patient presents with   Referral    pessary     S/P hysterectomy Been dealing with this for 3+ years, at first was not as bad And could push it up  Blood pressure 135/65, pulse 77, height 4' 11 (1.499 m), weight 98 lb (44.5 kg).  Laura Cook presents today for routine follow up related to her pessary.   She is fit today for a 2 1/4 inch Gelhorn pessary, not ideal fit but I think it is a big as she will tolerate Will recheck in a month     Vaginal Epithelial Abnormality Classification System:   0 0    No abnormalities 1    Epithelial erythema 2    Granulation tissue 3    Epithelial break or erosion, 1 cm or less 4    Epithelial break or erosion, 1 cm or greater  The pessary is removed, cleaned and replaced without difficulty.      ICD-10-CM   1. Vaginal vault prolapse after hysterectomy: fit for Gelhorn pessary 2 1/4 inch today  N99.3        Laura Cook will be sen back in 3 months for continued follow up.  Vonn VEAR Inch, MD  02/23/2023 11:11 AM

## 2023-02-26 ENCOUNTER — Telehealth: Payer: Self-pay

## 2023-02-26 NOTE — Telephone Encounter (Signed)
 RN Patelo from Pt facility called and left a message to let us  know Pt was having some discomfort and it was due to a net that fell out RN states the Pt had a bladder suspension surgery and the net fell into her bedside commode the facility was called back no on answered, but a message was left to return phone call

## 2023-02-27 DIAGNOSIS — Z1331 Encounter for screening for depression: Secondary | ICD-10-CM | POA: Diagnosis not present

## 2023-02-27 DIAGNOSIS — E8940 Asymptomatic postprocedural ovarian failure: Secondary | ICD-10-CM | POA: Diagnosis not present

## 2023-02-27 DIAGNOSIS — I10 Essential (primary) hypertension: Secondary | ICD-10-CM | POA: Diagnosis not present

## 2023-02-27 DIAGNOSIS — I700 Atherosclerosis of aorta: Secondary | ICD-10-CM | POA: Diagnosis not present

## 2023-02-27 DIAGNOSIS — Z0000 Encounter for general adult medical examination without abnormal findings: Secondary | ICD-10-CM | POA: Diagnosis not present

## 2023-02-27 DIAGNOSIS — Z299 Encounter for prophylactic measures, unspecified: Secondary | ICD-10-CM | POA: Diagnosis not present

## 2023-02-27 DIAGNOSIS — Z7189 Other specified counseling: Secondary | ICD-10-CM | POA: Diagnosis not present

## 2023-02-27 DIAGNOSIS — Z1339 Encounter for screening examination for other mental health and behavioral disorders: Secondary | ICD-10-CM | POA: Diagnosis not present

## 2023-03-01 ENCOUNTER — Ambulatory Visit (INDEPENDENT_AMBULATORY_CARE_PROVIDER_SITE_OTHER): Payer: HMO | Admitting: Obstetrics & Gynecology

## 2023-03-01 VITALS — BP 136/66 | HR 70

## 2023-03-01 DIAGNOSIS — N993 Prolapse of vaginal vault after hysterectomy: Secondary | ICD-10-CM

## 2023-03-01 NOTE — Progress Notes (Signed)
 Chief Complaint  Patient presents with   Pessary Check    Pessary fell out    Blood pressure 136/66, pulse 70.  Laura Cook presents today for routine follow up related to her pessary.   She uses a Galhorn 2 1/4 inch, it fell out, so replaced with 2 1/2 inch, difficult to fit, large volume defect with narrow vagina relatively She reports no vaginal discharge and no vaginal bleeding   Likert scale(1 not bothersome -5 very bothersome)  :  1  Exam reveals no undue vaginal mucosal pressure of breakdown, no discharge and no vaginal bleeding.  Vaginal Epithelial Abnormality Classification System:   0 0    No abnormalities 1    Epithelial erythema 2    Granulation tissue 3    Epithelial break or erosion, 1 cm or less 4    Epithelial break or erosion, 1 cm or greater  The pessary is removed, cleaned and replaced without difficulty.    No diagnosis found.   Laura Cook will be sen back in 1 months for continued follow up.  Vonn VEAR Inch, MD  03/01/2023 3:05 PM

## 2023-03-20 DIAGNOSIS — C7900 Secondary malignant neoplasm of unspecified kidney and renal pelvis: Secondary | ICD-10-CM | POA: Diagnosis not present

## 2023-03-20 DIAGNOSIS — R296 Repeated falls: Secondary | ICD-10-CM | POA: Diagnosis not present

## 2023-03-20 DIAGNOSIS — C259 Malignant neoplasm of pancreas, unspecified: Secondary | ICD-10-CM | POA: Diagnosis not present

## 2023-03-20 DIAGNOSIS — Z515 Encounter for palliative care: Secondary | ICD-10-CM | POA: Diagnosis not present

## 2023-03-20 DIAGNOSIS — M6281 Muscle weakness (generalized): Secondary | ICD-10-CM | POA: Diagnosis not present

## 2023-03-20 DIAGNOSIS — Z7409 Other reduced mobility: Secondary | ICD-10-CM | POA: Diagnosis not present

## 2023-03-20 DIAGNOSIS — E440 Moderate protein-calorie malnutrition: Secondary | ICD-10-CM | POA: Diagnosis not present

## 2023-03-20 DIAGNOSIS — R5383 Other fatigue: Secondary | ICD-10-CM | POA: Diagnosis not present

## 2023-04-16 ENCOUNTER — Encounter: Payer: Self-pay | Admitting: Obstetrics & Gynecology

## 2023-04-16 ENCOUNTER — Ambulatory Visit: Payer: HMO | Admitting: Obstetrics & Gynecology

## 2023-04-16 VITALS — BP 182/83 | HR 71

## 2023-04-16 DIAGNOSIS — N993 Prolapse of vaginal vault after hysterectomy: Secondary | ICD-10-CM | POA: Diagnosis not present

## 2023-04-16 NOTE — Progress Notes (Signed)
 Chief Complaint  Patient presents with   Pessary Check    Blood pressure (!) 169/85, pulse 71.  Laura Cook presents today for routine follow up related to her pessary.   She uses a Electronics engineer 2 1/2 inch It was working great til 3 days ago Then she pushed it back ed up when it moved a bit and then it was hurting  Refit for 2 3/4 inch She reports no vaginal discharge and no vaginal bleeding   Likert scale(1 not bothersome -5 very bothersome)  :  1  Exam reveals no undue vaginal mucosal pressure of breakdown, no discharge and no vaginal bleeding.  Vaginal Epithelial Abnormality Classification System:   0 0    No abnormalities 1    Epithelial erythema 2    Granulation tissue 3    Epithelial break or erosion, 1 cm or less 4    Epithelial break or erosion, 1 cm or greater  The pessary is removed, cleaned and replaced without difficulty.    No diagnosis found.   [name] will be sen back in 1 months for continued follow up.  Lazaro Arms, MD  04/16/2023 4:40 PM

## 2023-05-11 ENCOUNTER — Ambulatory Visit: Payer: HMO | Admitting: Obstetrics & Gynecology

## 2023-05-11 ENCOUNTER — Encounter: Payer: Self-pay | Admitting: Obstetrics & Gynecology

## 2023-05-11 VITALS — BP 132/67 | HR 68

## 2023-05-11 DIAGNOSIS — Z4689 Encounter for fitting and adjustment of other specified devices: Secondary | ICD-10-CM | POA: Diagnosis not present

## 2023-05-11 DIAGNOSIS — N993 Prolapse of vaginal vault after hysterectomy: Secondary | ICD-10-CM

## 2023-05-11 NOTE — Progress Notes (Signed)
 Chief Complaint  Patient presents with   Pessary Check    Blood pressure 132/67, pulse 68.  Laura Cook presents today for routine follow up related to her pessary.   She uses a Gelhorn 2 3/4 inch She reports no vaginal discharge and no vaginal bleeding   Likert scale(1 not bothersome -5 very bothersome)  :  1  Exam reveals no undue vaginal mucosal pressure of breakdown, no discharge and no vaginal bleeding.  Vaginal Epithelial Abnormality Classification System:   0 0    No abnormalities 1    Epithelial erythema 2    Granulation tissue 3    Epithelial break or erosion, 1 cm or less 4    Epithelial break or erosion, 1 cm or greater  The pessary is removed, cleaned and replaced without difficulty.      ICD-10-CM   1. Pessary maintenance: Gelhorn 2 3/4 in 04/16/23  Z46.89     2. Vaginal vault prolapse after hysterectomy: fit for Gelhorn pessary 2 1/4 inch, refit for 2 3/4 inch 04/16/23  N99.3        Marche R Corrigan will be sen back in 3 months for continued follow up.  Lazaro Arms, MD  05/11/2023 10:54 AM

## 2023-05-15 DIAGNOSIS — H353133 Nonexudative age-related macular degeneration, bilateral, advanced atrophic without subfoveal involvement: Secondary | ICD-10-CM | POA: Diagnosis not present

## 2023-05-18 DIAGNOSIS — R2681 Unsteadiness on feet: Secondary | ICD-10-CM | POA: Diagnosis not present

## 2023-05-18 DIAGNOSIS — Z299 Encounter for prophylactic measures, unspecified: Secondary | ICD-10-CM | POA: Diagnosis not present

## 2023-05-18 DIAGNOSIS — C787 Secondary malignant neoplasm of liver and intrahepatic bile duct: Secondary | ICD-10-CM | POA: Diagnosis not present

## 2023-05-18 DIAGNOSIS — I10 Essential (primary) hypertension: Secondary | ICD-10-CM | POA: Diagnosis not present

## 2023-05-18 DIAGNOSIS — N390 Urinary tract infection, site not specified: Secondary | ICD-10-CM | POA: Diagnosis not present

## 2023-05-19 DIAGNOSIS — E860 Dehydration: Secondary | ICD-10-CM | POA: Diagnosis not present

## 2023-05-19 DIAGNOSIS — I10 Essential (primary) hypertension: Secondary | ICD-10-CM | POA: Diagnosis not present

## 2023-05-19 DIAGNOSIS — R918 Other nonspecific abnormal finding of lung field: Secondary | ICD-10-CM | POA: Diagnosis not present

## 2023-05-19 DIAGNOSIS — Z87891 Personal history of nicotine dependence: Secondary | ICD-10-CM | POA: Diagnosis not present

## 2023-05-19 DIAGNOSIS — R9431 Abnormal electrocardiogram [ECG] [EKG]: Secondary | ICD-10-CM | POA: Diagnosis not present

## 2023-05-19 DIAGNOSIS — K449 Diaphragmatic hernia without obstruction or gangrene: Secondary | ICD-10-CM | POA: Diagnosis not present

## 2023-05-19 DIAGNOSIS — S3993XA Unspecified injury of pelvis, initial encounter: Secondary | ICD-10-CM | POA: Diagnosis not present

## 2023-05-19 DIAGNOSIS — R519 Headache, unspecified: Secondary | ICD-10-CM | POA: Diagnosis not present

## 2023-05-19 DIAGNOSIS — S0990XA Unspecified injury of head, initial encounter: Secondary | ICD-10-CM | POA: Diagnosis not present

## 2023-05-19 DIAGNOSIS — S199XXA Unspecified injury of neck, initial encounter: Secondary | ICD-10-CM | POA: Diagnosis not present

## 2023-05-19 DIAGNOSIS — I4430 Unspecified atrioventricular block: Secondary | ICD-10-CM | POA: Diagnosis not present

## 2023-05-19 DIAGNOSIS — M160 Bilateral primary osteoarthritis of hip: Secondary | ICD-10-CM | POA: Diagnosis not present

## 2023-05-19 DIAGNOSIS — I672 Cerebral atherosclerosis: Secondary | ICD-10-CM | POA: Diagnosis not present

## 2023-05-19 DIAGNOSIS — W19XXXA Unspecified fall, initial encounter: Secondary | ICD-10-CM | POA: Diagnosis not present

## 2023-05-19 DIAGNOSIS — W1839XA Other fall on same level, initial encounter: Secondary | ICD-10-CM | POA: Diagnosis not present

## 2023-05-19 DIAGNOSIS — I440 Atrioventricular block, first degree: Secondary | ICD-10-CM | POA: Diagnosis not present

## 2023-05-21 DIAGNOSIS — R609 Edema, unspecified: Secondary | ICD-10-CM | POA: Diagnosis not present

## 2023-05-21 DIAGNOSIS — R7989 Other specified abnormal findings of blood chemistry: Secondary | ICD-10-CM | POA: Diagnosis not present

## 2023-05-21 DIAGNOSIS — K219 Gastro-esophageal reflux disease without esophagitis: Secondary | ICD-10-CM | POA: Diagnosis not present

## 2023-05-21 DIAGNOSIS — R54 Age-related physical debility: Secondary | ICD-10-CM | POA: Diagnosis not present

## 2023-05-21 DIAGNOSIS — E785 Hyperlipidemia, unspecified: Secondary | ICD-10-CM | POA: Diagnosis not present

## 2023-05-21 DIAGNOSIS — I672 Cerebral atherosclerosis: Secondary | ICD-10-CM | POA: Diagnosis not present

## 2023-05-21 DIAGNOSIS — S199XXA Unspecified injury of neck, initial encounter: Secondary | ICD-10-CM | POA: Diagnosis not present

## 2023-05-21 DIAGNOSIS — M25579 Pain in unspecified ankle and joints of unspecified foot: Secondary | ICD-10-CM | POA: Diagnosis not present

## 2023-05-21 DIAGNOSIS — Z87891 Personal history of nicotine dependence: Secondary | ICD-10-CM | POA: Diagnosis not present

## 2023-05-21 DIAGNOSIS — I5032 Chronic diastolic (congestive) heart failure: Secondary | ICD-10-CM | POA: Diagnosis not present

## 2023-05-21 DIAGNOSIS — I6782 Cerebral ischemia: Secondary | ICD-10-CM | POA: Diagnosis not present

## 2023-05-21 DIAGNOSIS — Z7982 Long term (current) use of aspirin: Secondary | ICD-10-CM | POA: Diagnosis not present

## 2023-05-21 DIAGNOSIS — S81812A Laceration without foreign body, left lower leg, initial encounter: Secondary | ICD-10-CM | POA: Diagnosis not present

## 2023-05-21 DIAGNOSIS — I340 Nonrheumatic mitral (valve) insufficiency: Secondary | ICD-10-CM | POA: Diagnosis not present

## 2023-05-21 DIAGNOSIS — Z79899 Other long term (current) drug therapy: Secondary | ICD-10-CM | POA: Diagnosis not present

## 2023-05-21 DIAGNOSIS — Z7722 Contact with and (suspected) exposure to environmental tobacco smoke (acute) (chronic): Secondary | ICD-10-CM | POA: Diagnosis not present

## 2023-05-21 DIAGNOSIS — R9431 Abnormal electrocardiogram [ECG] [EKG]: Secondary | ICD-10-CM | POA: Diagnosis not present

## 2023-05-21 DIAGNOSIS — Z9181 History of falling: Secondary | ICD-10-CM | POA: Diagnosis not present

## 2023-05-21 DIAGNOSIS — S3993XA Unspecified injury of pelvis, initial encounter: Secondary | ICD-10-CM | POA: Diagnosis not present

## 2023-05-21 DIAGNOSIS — S0990XA Unspecified injury of head, initial encounter: Secondary | ICD-10-CM | POA: Diagnosis not present

## 2023-05-21 DIAGNOSIS — Z87440 Personal history of urinary (tract) infections: Secondary | ICD-10-CM | POA: Diagnosis not present

## 2023-05-21 DIAGNOSIS — I10 Essential (primary) hypertension: Secondary | ICD-10-CM | POA: Diagnosis not present

## 2023-05-21 DIAGNOSIS — G9389 Other specified disorders of brain: Secondary | ICD-10-CM | POA: Diagnosis not present

## 2023-05-21 DIAGNOSIS — R0789 Other chest pain: Secondary | ICD-10-CM | POA: Diagnosis not present

## 2023-05-21 DIAGNOSIS — Z7409 Other reduced mobility: Secondary | ICD-10-CM | POA: Diagnosis not present

## 2023-05-21 DIAGNOSIS — I110 Hypertensive heart disease with heart failure: Secondary | ICD-10-CM | POA: Diagnosis not present

## 2023-05-21 DIAGNOSIS — M1990 Unspecified osteoarthritis, unspecified site: Secondary | ICD-10-CM | POA: Diagnosis not present

## 2023-05-21 DIAGNOSIS — Z66 Do not resuscitate: Secondary | ICD-10-CM | POA: Diagnosis not present

## 2023-05-21 DIAGNOSIS — I3481 Nonrheumatic mitral (valve) annulus calcification: Secondary | ICD-10-CM | POA: Diagnosis not present

## 2023-05-21 DIAGNOSIS — M79672 Pain in left foot: Secondary | ICD-10-CM | POA: Diagnosis not present

## 2023-05-21 DIAGNOSIS — R55 Syncope and collapse: Secondary | ICD-10-CM | POA: Diagnosis not present

## 2023-05-21 DIAGNOSIS — R296 Repeated falls: Secondary | ICD-10-CM | POA: Diagnosis not present

## 2023-05-21 DIAGNOSIS — Z4789 Encounter for other orthopedic aftercare: Secondary | ICD-10-CM | POA: Diagnosis not present

## 2023-05-21 DIAGNOSIS — W19XXXA Unspecified fall, initial encounter: Secondary | ICD-10-CM | POA: Diagnosis not present

## 2023-05-21 DIAGNOSIS — R262 Difficulty in walking, not elsewhere classified: Secondary | ICD-10-CM | POA: Diagnosis not present

## 2023-05-21 DIAGNOSIS — M1612 Unilateral primary osteoarthritis, left hip: Secondary | ICD-10-CM | POA: Diagnosis not present

## 2023-05-22 DIAGNOSIS — W19XXXA Unspecified fall, initial encounter: Secondary | ICD-10-CM | POA: Diagnosis not present

## 2023-05-22 DIAGNOSIS — I10 Essential (primary) hypertension: Secondary | ICD-10-CM | POA: Diagnosis not present

## 2023-05-22 DIAGNOSIS — E785 Hyperlipidemia, unspecified: Secondary | ICD-10-CM | POA: Diagnosis not present

## 2023-05-22 DIAGNOSIS — R5381 Other malaise: Secondary | ICD-10-CM | POA: Diagnosis not present

## 2023-05-22 DIAGNOSIS — K219 Gastro-esophageal reflux disease without esophagitis: Secondary | ICD-10-CM | POA: Diagnosis not present

## 2023-05-22 DIAGNOSIS — R7989 Other specified abnormal findings of blood chemistry: Secondary | ICD-10-CM | POA: Diagnosis not present

## 2023-05-23 DIAGNOSIS — R7989 Other specified abnormal findings of blood chemistry: Secondary | ICD-10-CM | POA: Diagnosis not present

## 2023-05-23 DIAGNOSIS — R296 Repeated falls: Secondary | ICD-10-CM | POA: Diagnosis not present

## 2023-05-23 DIAGNOSIS — K219 Gastro-esophageal reflux disease without esophagitis: Secondary | ICD-10-CM | POA: Diagnosis not present

## 2023-05-23 DIAGNOSIS — R54 Age-related physical debility: Secondary | ICD-10-CM | POA: Diagnosis not present

## 2023-05-23 DIAGNOSIS — I110 Hypertensive heart disease with heart failure: Secondary | ICD-10-CM | POA: Diagnosis not present

## 2023-05-23 DIAGNOSIS — I5032 Chronic diastolic (congestive) heart failure: Secondary | ICD-10-CM | POA: Diagnosis not present

## 2023-05-23 DIAGNOSIS — E785 Hyperlipidemia, unspecified: Secondary | ICD-10-CM | POA: Diagnosis not present

## 2023-05-23 DIAGNOSIS — Z79899 Other long term (current) drug therapy: Secondary | ICD-10-CM | POA: Diagnosis not present

## 2023-05-24 DIAGNOSIS — Z79899 Other long term (current) drug therapy: Secondary | ICD-10-CM | POA: Diagnosis not present

## 2023-05-24 DIAGNOSIS — N390 Urinary tract infection, site not specified: Secondary | ICD-10-CM | POA: Diagnosis not present

## 2023-05-24 DIAGNOSIS — Z792 Long term (current) use of antibiotics: Secondary | ICD-10-CM | POA: Diagnosis not present

## 2023-05-24 DIAGNOSIS — R54 Age-related physical debility: Secondary | ICD-10-CM | POA: Diagnosis not present

## 2023-05-24 DIAGNOSIS — K219 Gastro-esophageal reflux disease without esophagitis: Secondary | ICD-10-CM | POA: Diagnosis not present

## 2023-05-24 DIAGNOSIS — I110 Hypertensive heart disease with heart failure: Secondary | ICD-10-CM | POA: Diagnosis not present

## 2023-05-24 DIAGNOSIS — E785 Hyperlipidemia, unspecified: Secondary | ICD-10-CM | POA: Diagnosis not present

## 2023-05-24 DIAGNOSIS — Z993 Dependence on wheelchair: Secondary | ICD-10-CM | POA: Diagnosis not present

## 2023-05-24 DIAGNOSIS — R7989 Other specified abnormal findings of blood chemistry: Secondary | ICD-10-CM | POA: Diagnosis not present

## 2023-05-24 DIAGNOSIS — R296 Repeated falls: Secondary | ICD-10-CM | POA: Diagnosis not present

## 2023-05-24 DIAGNOSIS — I5032 Chronic diastolic (congestive) heart failure: Secondary | ICD-10-CM | POA: Diagnosis not present

## 2023-05-25 DIAGNOSIS — I672 Cerebral atherosclerosis: Secondary | ICD-10-CM | POA: Diagnosis not present

## 2023-05-25 DIAGNOSIS — N390 Urinary tract infection, site not specified: Secondary | ICD-10-CM | POA: Diagnosis not present

## 2023-05-25 DIAGNOSIS — I5032 Chronic diastolic (congestive) heart failure: Secondary | ICD-10-CM | POA: Diagnosis not present

## 2023-05-25 DIAGNOSIS — W19XXXA Unspecified fall, initial encounter: Secondary | ICD-10-CM | POA: Diagnosis not present

## 2023-05-25 DIAGNOSIS — R7989 Other specified abnormal findings of blood chemistry: Secondary | ICD-10-CM | POA: Diagnosis not present

## 2023-05-25 DIAGNOSIS — G9389 Other specified disorders of brain: Secondary | ICD-10-CM | POA: Diagnosis not present

## 2023-05-25 DIAGNOSIS — I110 Hypertensive heart disease with heart failure: Secondary | ICD-10-CM | POA: Diagnosis not present

## 2023-05-25 DIAGNOSIS — E785 Hyperlipidemia, unspecified: Secondary | ICD-10-CM | POA: Diagnosis not present

## 2023-05-25 DIAGNOSIS — I6782 Cerebral ischemia: Secondary | ICD-10-CM | POA: Diagnosis not present

## 2023-05-25 DIAGNOSIS — K219 Gastro-esophageal reflux disease without esophagitis: Secondary | ICD-10-CM | POA: Diagnosis not present

## 2023-05-25 DIAGNOSIS — R54 Age-related physical debility: Secondary | ICD-10-CM | POA: Diagnosis not present

## 2023-05-26 DIAGNOSIS — Z79899 Other long term (current) drug therapy: Secondary | ICD-10-CM | POA: Diagnosis not present

## 2023-05-26 DIAGNOSIS — E785 Hyperlipidemia, unspecified: Secondary | ICD-10-CM | POA: Diagnosis not present

## 2023-05-26 DIAGNOSIS — R296 Repeated falls: Secondary | ICD-10-CM | POA: Diagnosis not present

## 2023-05-26 DIAGNOSIS — R7989 Other specified abnormal findings of blood chemistry: Secondary | ICD-10-CM | POA: Diagnosis not present

## 2023-05-26 DIAGNOSIS — I5032 Chronic diastolic (congestive) heart failure: Secondary | ICD-10-CM | POA: Diagnosis not present

## 2023-05-26 DIAGNOSIS — K219 Gastro-esophageal reflux disease without esophagitis: Secondary | ICD-10-CM | POA: Diagnosis not present

## 2023-05-26 DIAGNOSIS — G4710 Hypersomnia, unspecified: Secondary | ICD-10-CM | POA: Diagnosis not present

## 2023-05-26 DIAGNOSIS — I110 Hypertensive heart disease with heart failure: Secondary | ICD-10-CM | POA: Diagnosis not present

## 2023-05-26 DIAGNOSIS — Z792 Long term (current) use of antibiotics: Secondary | ICD-10-CM | POA: Diagnosis not present

## 2023-05-28 ENCOUNTER — Ambulatory Visit: Payer: HMO | Admitting: Obstetrics & Gynecology

## 2023-05-28 DIAGNOSIS — I10 Essential (primary) hypertension: Secondary | ICD-10-CM | POA: Diagnosis not present

## 2023-05-28 DIAGNOSIS — R2681 Unsteadiness on feet: Secondary | ICD-10-CM | POA: Diagnosis not present

## 2023-05-28 DIAGNOSIS — Z66 Do not resuscitate: Secondary | ICD-10-CM | POA: Diagnosis not present

## 2023-05-28 DIAGNOSIS — Z299 Encounter for prophylactic measures, unspecified: Secondary | ICD-10-CM | POA: Diagnosis not present

## 2023-06-08 DIAGNOSIS — R9431 Abnormal electrocardiogram [ECG] [EKG]: Secondary | ICD-10-CM | POA: Diagnosis not present

## 2023-06-25 DIAGNOSIS — Z79899 Other long term (current) drug therapy: Secondary | ICD-10-CM | POA: Diagnosis not present

## 2023-06-25 DIAGNOSIS — R5383 Other fatigue: Secondary | ICD-10-CM | POA: Diagnosis not present

## 2023-06-25 DIAGNOSIS — D649 Anemia, unspecified: Secondary | ICD-10-CM | POA: Diagnosis not present

## 2023-06-25 DIAGNOSIS — E7800 Pure hypercholesterolemia, unspecified: Secondary | ICD-10-CM | POA: Diagnosis not present

## 2023-07-09 ENCOUNTER — Telehealth: Payer: Self-pay | Admitting: Obstetrics & Gynecology

## 2023-07-09 NOTE — Telephone Encounter (Signed)
 Spoke with patients niece regarding pessary and possible uti. Pt scheduled for a visit with Dr. Randolm Butte on Thursday.

## 2023-07-09 NOTE — Telephone Encounter (Signed)
 Pt's niece is requesting a call

## 2023-07-12 ENCOUNTER — Encounter: Payer: Self-pay | Admitting: Obstetrics & Gynecology

## 2023-07-12 ENCOUNTER — Ambulatory Visit: Admitting: Obstetrics & Gynecology

## 2023-07-12 VITALS — BP 123/70 | HR 87

## 2023-07-12 DIAGNOSIS — R8290 Unspecified abnormal findings in urine: Secondary | ICD-10-CM

## 2023-07-12 DIAGNOSIS — N993 Prolapse of vaginal vault after hysterectomy: Secondary | ICD-10-CM | POA: Diagnosis not present

## 2023-07-12 DIAGNOSIS — Z4689 Encounter for fitting and adjustment of other specified devices: Secondary | ICD-10-CM | POA: Diagnosis not present

## 2023-07-12 LAB — POCT URINALYSIS DIPSTICK
Blood, UA: NEGATIVE
Glucose, UA: NEGATIVE
Ketones, UA: NEGATIVE
Nitrite, UA: NEGATIVE
Protein, UA: POSITIVE — AB

## 2023-07-12 NOTE — Progress Notes (Signed)
 Chief Complaint  Patient presents with   Pessary Check    Blood pressure 123/70, pulse 87.  Laura Cook presents today for routine follow up related to her pessary.   She uses a Gelhorn 2 3/4 inch She reports no vaginal discharge and no vaginal bleeding   Likert scale(1 not bothersome -5 very bothersome)  :  1  Exam reveals no undue vaginal mucosal pressure of breakdown, no discharge and no vaginal bleeding.  Vaginal Epithelial Abnormality Classification System:   0 0    No abnormalities 1    Epithelial erythema 2    Granulation tissue 3    Epithelial break or erosion, 1 cm or less 4    Epithelial break or erosion, 1 cm or greater  The pessary is removed, cleaned and replaced without difficulty.      ICD-10-CM   1. Pessary maintenance: Gelhorn 2 3/4 in 04/16/23  Z46.89     2. Vaginal vault prolapse after hysterectomy: fit for Gelhorn pessary 2 1/4 inch, refit for 2 3/4 inch 04/16/23  N99.3     3. Foul smelling urine  R82.90 POCT Urinalysis Dipstick    Urine Culture       [name] will be sen back in 4 months for continued follow up.  Wendelyn Halter, MD  07/12/2023 12:01 PM

## 2023-07-15 LAB — URINE CULTURE

## 2023-07-16 ENCOUNTER — Ambulatory Visit: Payer: Self-pay | Admitting: Obstetrics & Gynecology

## 2023-07-16 MED ORDER — NITROFURANTOIN MONOHYD MACRO 100 MG PO CAPS: 100.0000 mg | ORAL_CAPSULE | Freq: Two times a day (BID) | ORAL | 0 refills | Status: AC

## 2023-08-21 DEATH — deceased

## 2024-01-09 ENCOUNTER — Ambulatory Visit: Payer: HMO | Admitting: Urology
